# Patient Record
Sex: Male | Born: 1945
Health system: Southern US, Community
[De-identification: ages and names within clinical notes are randomized; demographics above are authoritative.]

## PROBLEM LIST (undated history)

## (undated) DIAGNOSIS — N200 Calculus of kidney: Secondary | ICD-10-CM

## (undated) DIAGNOSIS — I213 ST elevation (STEMI) myocardial infarction of unspecified site: Secondary | ICD-10-CM

## (undated) DIAGNOSIS — R1310 Dysphagia, unspecified: Secondary | ICD-10-CM

## (undated) DIAGNOSIS — G7000 Myasthenia gravis without (acute) exacerbation: Secondary | ICD-10-CM

## (undated) DIAGNOSIS — E785 Hyperlipidemia, unspecified: Secondary | ICD-10-CM

## (undated) DIAGNOSIS — G459 Transient cerebral ischemic attack, unspecified: Secondary | ICD-10-CM

## (undated) HISTORY — PX: LITHOTRIPSY: SUR834

## (undated) HISTORY — PX: LASIK: SHX215

## (undated) HISTORY — DX: Calculus of kidney: N20.0

## (undated) HISTORY — PX: BACK SURGERY: SHX140

## (undated) HISTORY — DX: Transient cerebral ischemic attack, unspecified: G45.9

## (undated) HISTORY — PX: NECK SURGERY: SHX720

## (undated) HISTORY — DX: Myasthenia gravis without (acute) exacerbation: G70.00

## (undated) HISTORY — PX: TONSILLECTOMY: SUR1361

## (undated) HISTORY — PX: CARDIAC CATHETERIZATION: SHX172

## (undated) HISTORY — DX: Dysphagia, unspecified: R13.10

---

## 2016-01-21 DIAGNOSIS — I129 Hypertensive chronic kidney disease with stage 1 through stage 4 chronic kidney disease, or unspecified chronic kidney disease: Secondary | ICD-10-CM | POA: Diagnosis not present

## 2016-01-21 DIAGNOSIS — N529 Male erectile dysfunction, unspecified: Secondary | ICD-10-CM | POA: Diagnosis not present

## 2016-01-21 DIAGNOSIS — Z87898 Personal history of other specified conditions: Secondary | ICD-10-CM | POA: Diagnosis not present

## 2016-01-21 DIAGNOSIS — R252 Cramp and spasm: Secondary | ICD-10-CM | POA: Diagnosis not present

## 2016-01-21 DIAGNOSIS — Z23 Encounter for immunization: Secondary | ICD-10-CM | POA: Diagnosis not present

## 2016-01-21 DIAGNOSIS — E785 Hyperlipidemia, unspecified: Secondary | ICD-10-CM | POA: Diagnosis not present

## 2016-01-21 DIAGNOSIS — N182 Chronic kidney disease, stage 2 (mild): Secondary | ICD-10-CM | POA: Diagnosis not present

## 2016-01-29 DIAGNOSIS — Z125 Encounter for screening for malignant neoplasm of prostate: Secondary | ICD-10-CM | POA: Diagnosis not present

## 2016-01-29 DIAGNOSIS — Z79899 Other long term (current) drug therapy: Secondary | ICD-10-CM | POA: Diagnosis not present

## 2016-01-29 DIAGNOSIS — I129 Hypertensive chronic kidney disease with stage 1 through stage 4 chronic kidney disease, or unspecified chronic kidney disease: Secondary | ICD-10-CM | POA: Diagnosis not present

## 2016-01-29 DIAGNOSIS — E785 Hyperlipidemia, unspecified: Secondary | ICD-10-CM | POA: Diagnosis not present

## 2016-01-29 DIAGNOSIS — Z6833 Body mass index (BMI) 33.0-33.9, adult: Secondary | ICD-10-CM | POA: Diagnosis not present

## 2016-01-29 DIAGNOSIS — Z0000 Encounter for general adult medical examination without abnormal findings: Secondary | ICD-10-CM | POA: Diagnosis not present

## 2016-01-29 DIAGNOSIS — N182 Chronic kidney disease, stage 2 (mild): Secondary | ICD-10-CM | POA: Diagnosis not present

## 2016-01-29 DIAGNOSIS — E669 Obesity, unspecified: Secondary | ICD-10-CM | POA: Diagnosis not present

## 2016-01-29 DIAGNOSIS — Z136 Encounter for screening for cardiovascular disorders: Secondary | ICD-10-CM | POA: Diagnosis not present

## 2016-02-23 HISTORY — PX: CYSTOSCOPY WITH INSERTION OF UROLIFT: SHX6678

## 2016-04-23 DIAGNOSIS — N3000 Acute cystitis without hematuria: Secondary | ICD-10-CM | POA: Diagnosis not present

## 2016-04-23 DIAGNOSIS — N401 Enlarged prostate with lower urinary tract symptoms: Secondary | ICD-10-CM | POA: Diagnosis not present

## 2016-04-23 DIAGNOSIS — R351 Nocturia: Secondary | ICD-10-CM | POA: Diagnosis not present

## 2016-04-23 DIAGNOSIS — R100 Acute abdomen: Secondary | ICD-10-CM | POA: Diagnosis not present

## 2016-04-23 DIAGNOSIS — N318 Other neuromuscular dysfunction of bladder: Secondary | ICD-10-CM | POA: Diagnosis not present

## 2016-05-20 DIAGNOSIS — E785 Hyperlipidemia, unspecified: Secondary | ICD-10-CM | POA: Diagnosis not present

## 2016-05-20 DIAGNOSIS — N529 Male erectile dysfunction, unspecified: Secondary | ICD-10-CM | POA: Diagnosis not present

## 2016-05-20 DIAGNOSIS — Z79899 Other long term (current) drug therapy: Secondary | ICD-10-CM | POA: Diagnosis not present

## 2016-05-20 DIAGNOSIS — R0602 Shortness of breath: Secondary | ICD-10-CM | POA: Diagnosis not present

## 2016-05-20 DIAGNOSIS — N182 Chronic kidney disease, stage 2 (mild): Secondary | ICD-10-CM | POA: Diagnosis not present

## 2016-05-20 DIAGNOSIS — I129 Hypertensive chronic kidney disease with stage 1 through stage 4 chronic kidney disease, or unspecified chronic kidney disease: Secondary | ICD-10-CM | POA: Diagnosis not present

## 2016-05-25 DIAGNOSIS — N401 Enlarged prostate with lower urinary tract symptoms: Secondary | ICD-10-CM | POA: Diagnosis not present

## 2016-05-25 DIAGNOSIS — R351 Nocturia: Secondary | ICD-10-CM | POA: Diagnosis not present

## 2016-05-25 DIAGNOSIS — N3020 Other chronic cystitis without hematuria: Secondary | ICD-10-CM | POA: Diagnosis not present

## 2016-07-13 DIAGNOSIS — R351 Nocturia: Secondary | ICD-10-CM | POA: Diagnosis not present

## 2016-07-13 DIAGNOSIS — N318 Other neuromuscular dysfunction of bladder: Secondary | ICD-10-CM | POA: Diagnosis not present

## 2016-07-13 DIAGNOSIS — N401 Enlarged prostate with lower urinary tract symptoms: Secondary | ICD-10-CM | POA: Diagnosis not present

## 2016-07-13 DIAGNOSIS — N3020 Other chronic cystitis without hematuria: Secondary | ICD-10-CM | POA: Diagnosis not present

## 2016-07-20 DIAGNOSIS — N3020 Other chronic cystitis without hematuria: Secondary | ICD-10-CM | POA: Diagnosis not present

## 2016-07-27 DIAGNOSIS — N401 Enlarged prostate with lower urinary tract symptoms: Secondary | ICD-10-CM | POA: Diagnosis not present

## 2016-07-27 DIAGNOSIS — N3020 Other chronic cystitis without hematuria: Secondary | ICD-10-CM | POA: Diagnosis not present

## 2016-07-27 DIAGNOSIS — N318 Other neuromuscular dysfunction of bladder: Secondary | ICD-10-CM | POA: Diagnosis not present

## 2016-07-27 DIAGNOSIS — R351 Nocturia: Secondary | ICD-10-CM | POA: Diagnosis not present

## 2016-07-29 DIAGNOSIS — E785 Hyperlipidemia, unspecified: Secondary | ICD-10-CM | POA: Diagnosis not present

## 2016-07-29 DIAGNOSIS — Z87442 Personal history of urinary calculi: Secondary | ICD-10-CM | POA: Diagnosis not present

## 2016-07-29 DIAGNOSIS — I10 Essential (primary) hypertension: Secondary | ICD-10-CM | POA: Diagnosis not present

## 2016-07-29 DIAGNOSIS — K219 Gastro-esophageal reflux disease without esophagitis: Secondary | ICD-10-CM | POA: Diagnosis not present

## 2016-07-29 DIAGNOSIS — R351 Nocturia: Secondary | ICD-10-CM | POA: Diagnosis not present

## 2016-07-29 DIAGNOSIS — N318 Other neuromuscular dysfunction of bladder: Secondary | ICD-10-CM | POA: Diagnosis not present

## 2016-07-29 DIAGNOSIS — N401 Enlarged prostate with lower urinary tract symptoms: Secondary | ICD-10-CM | POA: Diagnosis not present

## 2016-07-29 DIAGNOSIS — N3020 Other chronic cystitis without hematuria: Secondary | ICD-10-CM | POA: Diagnosis not present

## 2016-07-29 DIAGNOSIS — Z79899 Other long term (current) drug therapy: Secondary | ICD-10-CM | POA: Diagnosis not present

## 2016-08-05 DIAGNOSIS — R351 Nocturia: Secondary | ICD-10-CM | POA: Diagnosis not present

## 2016-08-05 DIAGNOSIS — N318 Other neuromuscular dysfunction of bladder: Secondary | ICD-10-CM | POA: Diagnosis not present

## 2016-08-05 DIAGNOSIS — N3020 Other chronic cystitis without hematuria: Secondary | ICD-10-CM | POA: Diagnosis not present

## 2016-08-05 DIAGNOSIS — N401 Enlarged prostate with lower urinary tract symptoms: Secondary | ICD-10-CM | POA: Diagnosis not present

## 2016-08-16 DIAGNOSIS — I129 Hypertensive chronic kidney disease with stage 1 through stage 4 chronic kidney disease, or unspecified chronic kidney disease: Secondary | ICD-10-CM | POA: Diagnosis not present

## 2016-08-16 DIAGNOSIS — K1370 Unspecified lesions of oral mucosa: Secondary | ICD-10-CM | POA: Diagnosis not present

## 2016-08-16 DIAGNOSIS — N182 Chronic kidney disease, stage 2 (mild): Secondary | ICD-10-CM | POA: Diagnosis not present

## 2016-08-16 DIAGNOSIS — E785 Hyperlipidemia, unspecified: Secondary | ICD-10-CM | POA: Diagnosis not present

## 2016-08-20 DIAGNOSIS — R351 Nocturia: Secondary | ICD-10-CM | POA: Diagnosis not present

## 2016-08-20 DIAGNOSIS — N318 Other neuromuscular dysfunction of bladder: Secondary | ICD-10-CM | POA: Diagnosis not present

## 2016-08-20 DIAGNOSIS — N3020 Other chronic cystitis without hematuria: Secondary | ICD-10-CM | POA: Diagnosis not present

## 2016-08-20 DIAGNOSIS — N401 Enlarged prostate with lower urinary tract symptoms: Secondary | ICD-10-CM | POA: Diagnosis not present

## 2016-09-20 DIAGNOSIS — R351 Nocturia: Secondary | ICD-10-CM | POA: Diagnosis not present

## 2016-09-20 DIAGNOSIS — N318 Other neuromuscular dysfunction of bladder: Secondary | ICD-10-CM | POA: Diagnosis not present

## 2016-09-20 DIAGNOSIS — N3020 Other chronic cystitis without hematuria: Secondary | ICD-10-CM | POA: Diagnosis not present

## 2016-09-20 DIAGNOSIS — N401 Enlarged prostate with lower urinary tract symptoms: Secondary | ICD-10-CM | POA: Diagnosis not present

## 2016-11-18 DIAGNOSIS — I129 Hypertensive chronic kidney disease with stage 1 through stage 4 chronic kidney disease, or unspecified chronic kidney disease: Secondary | ICD-10-CM | POA: Diagnosis not present

## 2016-11-18 DIAGNOSIS — E785 Hyperlipidemia, unspecified: Secondary | ICD-10-CM | POA: Diagnosis not present

## 2016-11-18 DIAGNOSIS — N182 Chronic kidney disease, stage 2 (mild): Secondary | ICD-10-CM | POA: Diagnosis not present

## 2016-11-25 DIAGNOSIS — D649 Anemia, unspecified: Secondary | ICD-10-CM | POA: Diagnosis not present

## 2016-11-25 DIAGNOSIS — Z23 Encounter for immunization: Secondary | ICD-10-CM | POA: Diagnosis not present

## 2016-12-08 DIAGNOSIS — R4781 Slurred speech: Secondary | ICD-10-CM | POA: Diagnosis not present

## 2016-12-13 DIAGNOSIS — H547 Unspecified visual loss: Secondary | ICD-10-CM | POA: Diagnosis not present

## 2016-12-13 DIAGNOSIS — H538 Other visual disturbances: Secondary | ICD-10-CM | POA: Diagnosis not present

## 2016-12-13 DIAGNOSIS — D649 Anemia, unspecified: Secondary | ICD-10-CM | POA: Diagnosis not present

## 2016-12-13 DIAGNOSIS — G459 Transient cerebral ischemic attack, unspecified: Secondary | ICD-10-CM | POA: Diagnosis not present

## 2016-12-13 DIAGNOSIS — R1310 Dysphagia, unspecified: Secondary | ICD-10-CM | POA: Diagnosis not present

## 2016-12-13 DIAGNOSIS — R4702 Dysphasia: Secondary | ICD-10-CM | POA: Diagnosis not present

## 2016-12-16 DIAGNOSIS — G459 Transient cerebral ischemic attack, unspecified: Secondary | ICD-10-CM | POA: Diagnosis not present

## 2016-12-21 DIAGNOSIS — R1310 Dysphagia, unspecified: Secondary | ICD-10-CM | POA: Diagnosis not present

## 2016-12-21 DIAGNOSIS — R351 Nocturia: Secondary | ICD-10-CM | POA: Diagnosis not present

## 2016-12-21 DIAGNOSIS — N401 Enlarged prostate with lower urinary tract symptoms: Secondary | ICD-10-CM | POA: Diagnosis not present

## 2016-12-21 DIAGNOSIS — N318 Other neuromuscular dysfunction of bladder: Secondary | ICD-10-CM | POA: Diagnosis not present

## 2016-12-21 DIAGNOSIS — N3020 Other chronic cystitis without hematuria: Secondary | ICD-10-CM | POA: Diagnosis not present

## 2017-01-12 ENCOUNTER — Encounter: Payer: Self-pay | Admitting: Neurology

## 2017-01-25 DIAGNOSIS — R1310 Dysphagia, unspecified: Secondary | ICD-10-CM | POA: Diagnosis not present

## 2017-01-25 DIAGNOSIS — D500 Iron deficiency anemia secondary to blood loss (chronic): Secondary | ICD-10-CM | POA: Diagnosis not present

## 2017-01-25 DIAGNOSIS — R195 Other fecal abnormalities: Secondary | ICD-10-CM | POA: Diagnosis not present

## 2017-02-03 DIAGNOSIS — K649 Unspecified hemorrhoids: Secondary | ICD-10-CM | POA: Diagnosis not present

## 2017-02-03 DIAGNOSIS — K5730 Diverticulosis of large intestine without perforation or abscess without bleeding: Secondary | ICD-10-CM | POA: Diagnosis not present

## 2017-02-03 DIAGNOSIS — R1314 Dysphagia, pharyngoesophageal phase: Secondary | ICD-10-CM | POA: Diagnosis not present

## 2017-02-03 DIAGNOSIS — K219 Gastro-esophageal reflux disease without esophagitis: Secondary | ICD-10-CM | POA: Diagnosis not present

## 2017-02-03 DIAGNOSIS — R195 Other fecal abnormalities: Secondary | ICD-10-CM | POA: Diagnosis not present

## 2017-02-03 DIAGNOSIS — K648 Other hemorrhoids: Secondary | ICD-10-CM | POA: Diagnosis not present

## 2017-02-03 DIAGNOSIS — K2970 Gastritis, unspecified, without bleeding: Secondary | ICD-10-CM | POA: Diagnosis not present

## 2017-02-03 DIAGNOSIS — I129 Hypertensive chronic kidney disease with stage 1 through stage 4 chronic kidney disease, or unspecified chronic kidney disease: Secondary | ICD-10-CM | POA: Diagnosis not present

## 2017-02-03 DIAGNOSIS — D649 Anemia, unspecified: Secondary | ICD-10-CM | POA: Diagnosis not present

## 2017-02-03 DIAGNOSIS — K2900 Acute gastritis without bleeding: Secondary | ICD-10-CM | POA: Diagnosis not present

## 2017-02-03 DIAGNOSIS — D500 Iron deficiency anemia secondary to blood loss (chronic): Secondary | ICD-10-CM | POA: Diagnosis not present

## 2017-02-03 DIAGNOSIS — N182 Chronic kidney disease, stage 2 (mild): Secondary | ICD-10-CM | POA: Diagnosis not present

## 2017-02-03 DIAGNOSIS — K449 Diaphragmatic hernia without obstruction or gangrene: Secondary | ICD-10-CM | POA: Diagnosis not present

## 2017-03-21 DIAGNOSIS — Z1211 Encounter for screening for malignant neoplasm of colon: Secondary | ICD-10-CM | POA: Diagnosis not present

## 2017-03-21 DIAGNOSIS — N401 Enlarged prostate with lower urinary tract symptoms: Secondary | ICD-10-CM | POA: Diagnosis not present

## 2017-03-21 DIAGNOSIS — Z125 Encounter for screening for malignant neoplasm of prostate: Secondary | ICD-10-CM | POA: Diagnosis not present

## 2017-03-21 DIAGNOSIS — Z9181 History of falling: Secondary | ICD-10-CM | POA: Diagnosis not present

## 2017-03-21 DIAGNOSIS — N3020 Other chronic cystitis without hematuria: Secondary | ICD-10-CM | POA: Diagnosis not present

## 2017-03-21 DIAGNOSIS — I129 Hypertensive chronic kidney disease with stage 1 through stage 4 chronic kidney disease, or unspecified chronic kidney disease: Secondary | ICD-10-CM | POA: Diagnosis not present

## 2017-03-21 DIAGNOSIS — E669 Obesity, unspecified: Secondary | ICD-10-CM | POA: Diagnosis not present

## 2017-03-21 DIAGNOSIS — Z0000 Encounter for general adult medical examination without abnormal findings: Secondary | ICD-10-CM | POA: Diagnosis not present

## 2017-03-21 DIAGNOSIS — Z1331 Encounter for screening for depression: Secondary | ICD-10-CM | POA: Diagnosis not present

## 2017-03-21 DIAGNOSIS — D649 Anemia, unspecified: Secondary | ICD-10-CM | POA: Diagnosis not present

## 2017-03-21 DIAGNOSIS — R739 Hyperglycemia, unspecified: Secondary | ICD-10-CM | POA: Diagnosis not present

## 2017-03-21 DIAGNOSIS — N182 Chronic kidney disease, stage 2 (mild): Secondary | ICD-10-CM | POA: Diagnosis not present

## 2017-03-21 DIAGNOSIS — R351 Nocturia: Secondary | ICD-10-CM | POA: Diagnosis not present

## 2017-03-21 DIAGNOSIS — N318 Other neuromuscular dysfunction of bladder: Secondary | ICD-10-CM | POA: Diagnosis not present

## 2017-04-20 ENCOUNTER — Encounter: Payer: Self-pay | Admitting: Neurology

## 2017-04-20 ENCOUNTER — Ambulatory Visit: Payer: Medicare HMO | Admitting: Neurology

## 2017-04-20 ENCOUNTER — Other Ambulatory Visit: Payer: Medicare HMO

## 2017-04-20 VITALS — BP 140/70 | HR 66 | Ht 66.0 in | Wt 187.5 lb

## 2017-04-20 DIAGNOSIS — R1310 Dysphagia, unspecified: Secondary | ICD-10-CM

## 2017-04-20 DIAGNOSIS — R471 Dysarthria and anarthria: Secondary | ICD-10-CM | POA: Diagnosis not present

## 2017-04-20 MED ORDER — PYRIDOSTIGMINE BROMIDE 60 MG PO TABS: ORAL_TABLET | ORAL | 5 refills | Status: DC

## 2017-04-20 NOTE — Progress Notes (Signed)
[facility] Neurology Division Clinic Note - Initial Visit   Date: 04/20/17  Adrian Bell MRN: [id number] DOB: [birth year]   Dear Dr. Maisie Fus:  Thank you for your kind referral of Adrian Bell for consultation of dysphagia and dysarthria. Although his history is well known to you, please allow [name] to reiterate it for the purpose of our medical record. The patient was accompanied to the clinic by wife who also provides collateral information.     History of Present Illness: Adrian Bell is a 72 y.o. right-handed Caucasian male with hypertension, kidney stones, and iron deficiency anemia presenting for evaluation of dysarthria and dysphagia.    Starting around the summer of 2018, he developed spells of slurred speech, difficulty swallowing, and double vision which is always worse by the afternoon and evening. Closing one eye would resolve his double vision. Slurred speech is worse after talking for longer periods of time. Rest always improves his symptoms.  He denies any arm or leg weakness.  He went to the emergency department in October and was evaluated for TIA with CT head, MRI brain, and echo which returned normal, per patient.    He has esophagus was stretched recently, which has helped.  He takes gabapentin 300mg  twice daily and feels that his speech and swallowing has improved, but not resolved.    He had one episode of what sounds like transient global amnesia in 2016 which lasted about an hour.   Out-side paper records, electronic medical record, and images have been reviewed where available and summarized as:  CT head 12/08/2016:  Unremarkable  Past Medical History:  Diagnosis Date  . Dysphagia   . Kidney stones   . TIA (transient ischemic attack)     Past Surgical History:  Procedure Laterality Date  . BACK SURGERY    . LITHOTRIPSY       Medications:  Outpatient Encounter Medications as of 04/20/2017  Medication Sig  . aspirin EC 81 MG tablet Take 81 mg by mouth  daily.  . Ferrous Sulfate (SLOW FE PO) Take by mouth.  . gabapentin (NEURONTIN) 300 MG capsule   . ramipril (ALTACE) 5 MG capsule    No facility-administered encounter medications on file as of 04/20/2017.      Allergies:  Allergies  Allergen Reactions  . Demerol [Meperidine Hcl]     Family History: Family History  Problem Relation Age of Onset  . Heart attack Mother   . Heart attack Father   . Diabetes Sister   . Heart attack Sister   . Esophageal cancer Brother     Social History: Social History   Tobacco Use  . Smoking status: Never Smoker  . Smokeless tobacco: Former Engineer, water Use Topics  . Alcohol use: No    Frequency: Never  . Drug use: No   Social History   Social History Narrative   Lives with wife in a one story home.  Has 3 children.     Retired Public librarian.     Education: some college.     Review of Systems:  CONSTITUTIONAL: No fevers, chills, night sweats, or weight loss.   EYES: No visual changes or eye pain ENT: No hearing changes.  No history of nose bleeds.   RESPIRATORY: No cough, wheezing and shortness of breath.   CARDIOVASCULAR: Negative for chest pain, and palpitations.   GI: Negative for abdominal discomfort, blood in stools or black stools.  No recent change in bowel habits.   GU:  No history of incontinence.   MUSCLOSKELETAL: No history of joint pain or swelling.  No myalgias.   SKIN: Negative for lesions, rash, and itching.   HEMATOLOGY/ONCOLOGY: Negative for prolonged bleeding, bruising easily, and swollen nodes.  No history of cancer.   ENDOCRINE: Negative for cold or heat intolerance, polydipsia or goiter.   PSYCH:  No depression or anxiety symptoms.   NEURO: As Above.   Vital Signs:  BP 140/70   Pulse 66   Ht 5\' 6"  (1.676 m)   Wt 187 lb 8 oz (85 kg)   SpO2 98%   BMI 30.26 kg/m    General Medical Exam:   General:  Well appearing, comfortable.   Eyes/ENT: see cranial nerve examination.     Neck: No masses appreciated.  Full range of motion without tenderness.  No carotid bruits. Respiratory:  Clear to auscultation, good air entry bilaterally.   Cardiac:  Regular rate and rhythm, no murmur.   Extremities:  No deformities, edema, or skin discoloration.  Skin:  No rashes or lesions.  Neurological Exam: MENTAL STATUS including orientation to time, place, person, recent and remote memory, attention span and concentration, language, and fund of knowledge is normal.  With prolonged talking, there is mild dysarthria.    CRANIAL NERVES: II:  No visual field defects.  Unremarkable fundi.   III-IV-VI: Pupils equal round and reactive to light.  Normal conjugate, extra-ocular eye movements in all directions of gaze.  No nystagmus. Mild bilateral ptosis without worsening with sustained upgaze.   V:  Normal facial sensation.    VII:  Normal facial symmetry and movements. Frontalis, orbicularis oculi, orbicularis oris, and buccinator is 5/5.    VIII:  Normal hearing and vestibular function.   IX-X:  Normal palatal movement.   XI:  Normal shoulder shrug and head rotation.   XII:  Normal tongue strength and range of motion, no deviation or fasciculation.  MOTOR:  No atrophy, fasciculations or abnormal movements. No muscle fatigability.  No pronator drift.  Tone is normal.    Right Upper Extremity:    Left Upper Extremity:    Deltoid  5/5   Deltoid  5/5   Biceps  5/5   Biceps  5/5   Triceps  5/5   Triceps  5/5   Wrist extensors  5/5   Wrist extensors  5/5   Wrist flexors  5/5   Wrist flexors  5/5   Finger extensors  5/5   Finger extensors  5/5   Finger flexors  5/5   Finger flexors  5/5   Dorsal interossei  5/5   Dorsal interossei  5/5   Abductor pollicis  5/5   Abductor pollicis  5/5   Tone (Ashworth scale)  0  Tone (Ashworth scale)  0   Right Lower Extremity:    Left Lower Extremity:    Hip flexors  5/5   Hip flexors  5/5   Hip extensors  5/5   Hip extensors  5/5   Knee flexors   5/5   Knee flexors  5/5   Knee extensors  5/5   Knee extensors  5/5   Dorsiflexors  5/5   Dorsiflexors  5/5   Plantarflexors  5/5   Plantarflexors  5/5   Toe extensors  5/5   Toe extensors  5/5   Toe flexors  5/5   Toe flexors  5/5   Tone (Ashworth scale)  0  Tone (Ashworth scale)  0   MSRs:  Right  Left brachioradialis 2+  brachioradialis 2+  biceps 2+  biceps 2+  triceps 2+  triceps 2+  patellar 2+  patellar 2+  ankle jerk 2+  ankle jerk 2+  Hoffman no  Hoffman no  plantar response down  plantar response down   SENSORY:  Normal and symmetric perception of light touch, pinprick, vibration, and proprioception.    COORDINATION/GAIT: Normal finger-to- nose-finger.  Intact rapid alternating movements bilaterally.  Able to rise from a chair without using arms.  Gait narrow based and stable. Tandem and stressed gait intact.    IMPRESSION: Mr. Eudy is a 72 year-old man referred for evaluation of dysphagia, dysarthria, and diplopia starting in the fall of 2018.  Symptoms are very classic for myasthenia gravis, especially as he has diurnal variation.  I will check myasthenia gravis antibody panel and offer a trial of mestinon.  We discussed the potential diagnosis of myasthenia gravis regarding the pathogenesis, prognosis, management plan.  PLAN/RECOMMENDATIONS:  Check myasthenia gravis panel Start mestinon 60mg  three times daily (8am, 1pm, 5pm).  Side effects discussed.  If antibodies return negative, the next step is NCS/EMG with RNS of the right arm and leg.   Mestinon will be held on the day of testing.   Return to clinic after testing.  Thank you for allowing me to participate in patient's care.  If I can answer any additional questions, I would be pleased to do so.    Sincerely,    Donika K. Allena Katz, DO

## 2017-04-20 NOTE — Patient Instructions (Addendum)
Check labs  Start mestinon (pyridostigmine) 60mg  three times daily at 8am, 1pm, and 5pm  If your labs are normal, we will bring you back for a nerve conduction study.  Please do not take your mestinon on the day of your testing procedure.

## 2017-04-28 ENCOUNTER — Telehealth: Payer: Self-pay | Admitting: Neurology

## 2017-04-28 LAB — MYASTHENIA GRAVIS PANEL 2
A CHR BINDING ABS: 9.2 nmol/L — ABNORMAL HIGH
ACHR Blocking Abs: 76 % Inhibition — ABNORMAL HIGH (ref <15)
Acetylchol Modul Ab: 90 % Inhibition — ABNORMAL HIGH

## 2017-04-28 NOTE — Telephone Encounter (Signed)
Patient wants the results of the blood work that he had done please call

## 2017-04-28 NOTE — Telephone Encounter (Signed)
Patient made aware results not back. Myasthenia panel can take awhile but we will call when we receive them. He expressed understanding.

## 2017-05-03 ENCOUNTER — Ambulatory Visit: Payer: Medicare HMO | Admitting: Neurology

## 2017-05-03 ENCOUNTER — Encounter: Payer: Self-pay | Admitting: Neurology

## 2017-05-03 ENCOUNTER — Ambulatory Visit
Admission: RE | Admit: 2017-05-03 | Discharge: 2017-05-03 | Disposition: A | Discharge status: Home / Self Care | Payer: Medicare HMO | Source: Ambulatory Visit | Attending: Neurology | Admitting: Neurology

## 2017-05-03 VITALS — BP 136/70 | HR 55 | Ht 66.0 in | Wt 186.4 lb

## 2017-05-03 DIAGNOSIS — G7001 Myasthenia gravis with (acute) exacerbation: Secondary | ICD-10-CM

## 2017-05-03 DIAGNOSIS — I700 Atherosclerosis of aorta: Secondary | ICD-10-CM | POA: Diagnosis not present

## 2017-05-03 DIAGNOSIS — R0602 Shortness of breath: Secondary | ICD-10-CM

## 2017-05-03 DIAGNOSIS — G7000 Myasthenia gravis without (acute) exacerbation: Secondary | ICD-10-CM | POA: Insufficient documentation

## 2017-05-03 HISTORY — DX: Myasthenia gravis without (acute) exacerbation: G70.00

## 2017-05-03 MED ORDER — IOPAMIDOL (ISOVUE-300) INJECTION 61%
75.0000 mL | Freq: Once | INTRAVENOUS | Status: AC | PRN
  Administered 2017-05-03: 75 mL via INTRAVENOUS

## 2017-05-03 MED ORDER — PREDNISONE 10 MG PO TABS: 20.0000 mg | ORAL_TABLET | Freq: Every day | ORAL | 3 refills | Status: DC

## 2017-05-03 NOTE — Patient Instructions (Addendum)
Start prednisone 20mg  daily Start calcium intake of 1200 mg/day and vitamin D intake of 800 international units/day Start prilosec 20mg  daily CT chest  Return to clinic on April 17th at 11:30am

## 2017-05-03 NOTE — Progress Notes (Signed)
Follow-up Visit   Date: 05/03/17    Adrian Bell MRN: [id number] DOB: 1946/01/05   Interim History: Adrian Bell is a 72 y.o. right-handed Caucasian male with hypertension, kidney stones, and iron deficiency anemia returning to the clinic for follow-up of seropositive myasthenia gravis.  The patient was accompanied to the clinic by wife who also provides collateral information.    History of present illness: Starting around the summer of 2018, he developed spells of slurred speech, difficulty swallowing, and double vision which is always worse by the afternoon and evening. Closing one eye would resolve his double vision. Slurred speech is worse after talking for longer periods of time. Rest always improves his symptoms.  He denies any arm or leg weakness.  He went to the emergency department in October and was evaluated for TIA with CT head, MRI brain, and echo which returned normal, per patient.    He has esophagus was stretched recently, which has helped.  He takes gabapentin 300mg  twice daily and feels that his speech and swallowing has improved, but not resolved.    He had one episode of what sounds like transient global amnesia in 2016 which lasted about an hour.   UPDATE 05/03/2017:  He is here to discuss the results of his testing which shows positive AChR antibodies, confirming diagnosis of myasthenia gravis, which was suspected.  He was started on mestinon and within an hour of his first dose, he noticed resolution of double vision.  He was able to increase mestinon to 60mg  three times daily (8am, 1pm, and 5pm) and has been tolerating it well; initially he has some diarrhea, which improved.  Swallowing has also improved some, but he has not appreciated any difference in his speech.  He denies any weakness.     Medications:  Current Outpatient Medications on File Prior to Visit  Medication Sig Dispense Refill  . aspirin EC 81 MG tablet Take 81 mg by mouth daily.    . Ferrous  Sulfate (SLOW FE PO) Take by mouth.    . pyridostigmine (MESTINON) 60 MG tablet Take 1 tablet at 8am, 1 tab at 1pm, and 1 tab at 5pm. 90 tablet 5  . ramipril (ALTACE) 5 MG capsule     . gabapentin (NEURONTIN) 300 MG capsule   0   No current facility-administered medications on file prior to visit.     Allergies:  Allergies  Allergen Reactions  . Demerol [Meperidine Hcl]     Review of Systems:  CONSTITUTIONAL: No fevers, chills, night sweats, or weight loss.  EYES: No visual changes or eye pain ENT: No hearing changes.  No history of nose bleeds.   RESPIRATORY: No cough, wheezing and shortness of breath.   CARDIOVASCULAR: Negative for chest pain, and palpitations.   GI: Negative for abdominal discomfort, blood in stools or black stools.  No recent change in bowel habits.   GU:  No history of incontinence.   MUSCLOSKELETAL: No history of joint pain or swelling.  No myalgias.   SKIN: Negative for lesions, rash, and itching.   ENDOCRINE: Negative for cold or heat intolerance, polydipsia or goiter.   PSYCH:  No depression or anxiety symptoms.   NEURO: As Above.   Vital Signs:  BP 136/70   Pulse (!) 55   Ht 5\' 6"  (1.676 m)   Wt 186 lb 6 oz (84.5 kg)   SpO2 98%   BMI 30.08 kg/m    General: Well-appearing, comfortable.  Neck: No carotid bruit CV:  Regular rate and rhythm Ext: no edema  Neurological Exam: MENTAL STATUS including orientation to time, place, person, recent and remote memory, attention span and concentration, language, and fund of knowledge is normal.  Speech is mildly dysarthric.  CRANIAL NERVES:  Pupils equal round and reactive to light.  Normal conjugate, extra-ocular eye movements in all directions of gaze.  Right ptosis (mild) slight worsening with sustained upgaze.  Face is symmetric. Palate elevates symmetrically.  Tongue is midline.  Orbicularis oculi 5-/5.  Mild weakness with buccinator 4/5.    MOTOR:  Motor strength is 5/5 in all extremities.  Neck  flexion is 5/5.  No atrophy, fasciculations or abnormal movements.  No pronator drift.  Tone is normal.    COORDINATION/GAIT:   Gait narrow based and stable.   Data: CT head 12/08/2016:  Unremarkable  Labs 04/20/2017:   AChR binding 9.20*, blocking 76*, and modulating 90*   IMPRESSION/PLAN: Seropositive myasthenia gravis with exacerbation, isolated bulbar involvement (onset 2018, diagnosed March 2019).  I had an extensive discussion with the patient regarding the diagnosis, pathogenesis, prognosis, management plan, meds to avoid, and potential crisis myasthenia gravis. I also discussed the warning signs and symptoms of MG crisis (including significant swallowing and breathing difficulty) that should prompt urgent medical evaluation since an exacerbation of myasthenia gravis is potentially life-threatening.  - CT chest w contrast to evaluate for thymoma  - Start prednisone 20mg  daily  - Start calcium intake of 1200 mg/day and vitamin D intake of 800 international units/day  - Start prilosec 20mg  daily  - Check CMP and TPMT at the next visit, if there is no improvement with prednisone   Return to clinic in 1 month   The duration of this appointment visit was 40 minutes of face-to-face time with the patient.  Greater than 50% of this time was spent in counseling, explanation of diagnosis, planning of further management, and coordination of care.   Thank you for allowing me to participate in patient's care.  If I can answer any additional questions, I would be pleased to do so.    Sincerely,    Swara Donze K. Allena Katz, DO

## 2017-05-09 ENCOUNTER — Telehealth: Payer: Self-pay | Admitting: *Deleted

## 2017-05-09 NOTE — Telephone Encounter (Signed)
Report mailed.  

## 2017-05-09 NOTE — Telephone Encounter (Signed)
-----   Message from Glendale Chard, DO sent at 05/06/2017  4:34 PM EDT ----- Results discussed with patient, no thymoma.  There is coronary artery calcification which I have asked that he f/u with cardiology for further evaluation.  Morrie Sheldon, please mail patient a report of his CT chest to [facility]. Thanks.

## 2017-05-13 ENCOUNTER — Telehealth: Payer: Self-pay | Admitting: Neurology

## 2017-05-13 MED ORDER — PREDNISONE 10 MG PO TABS: 30.0000 mg | ORAL_TABLET | Freq: Every day | ORAL | 3 refills | Status: DC

## 2017-05-13 MED ORDER — PYRIDOSTIGMINE BROMIDE 60 MG PO TABS: ORAL_TABLET | ORAL | 5 refills | Status: DC

## 2017-05-13 NOTE — Telephone Encounter (Signed)
Called to see how patient was doing on prednisone 20mg  and he continues to have dysarthria, so will increase to prednisone 30mg  and mestinon 60mg  QID (7am, 11am, 3pm, and 7pm). New prescriptions were sent.  If there is any worsening MG symptoms, low threshold to start IVIG.  Donika K. Allena Katz, DO

## 2017-06-08 ENCOUNTER — Encounter: Payer: Self-pay | Admitting: Neurology

## 2017-06-08 ENCOUNTER — Ambulatory Visit: Payer: Medicare HMO | Admitting: Neurology

## 2017-06-08 VITALS — BP 118/60 | HR 61 | Ht 66.0 in | Wt 177.5 lb

## 2017-06-08 DIAGNOSIS — G7001 Myasthenia gravis with (acute) exacerbation: Secondary | ICD-10-CM

## 2017-06-08 NOTE — Patient Instructions (Addendum)
Continue prednisone to 30mg  daily for the month of April  Starting May 1st, reduce to prednisone 25mg  daily for two weeks, then reduce to 20mg  daily  Continue mestinon as you are taking  I will request your most recent labs from the [place]  Check TPMT   Return to clinic on May 29th at 11:30am

## 2017-06-08 NOTE — Progress Notes (Signed)
Follow-up Visit   Date: 06/08/17    Adrian Bell MRN: [id number] DOB: 1945/04/05   Interim History: Adrian Bell is a 72 y.o. right-handed Caucasian male with hypertension, kidney stones, and iron deficiency anemia returning to the clinic for follow-up of seropositive myasthenia gravis.  The patient was accompanied to the clinic by wife who also provides collateral information.    History of present illness: Starting around the summer of 2018, he developed spells of slurred speech, difficulty swallowing, and double vision which is always worse by the afternoon and evening. Closing one eye would resolve his double vision. Slurred speech is worse after talking for longer periods of time. Rest always improves his symptoms.  He denies any arm or leg weakness.  He went to the emergency department in October and was evaluated for TIA with CT head, MRI brain, and echo which returned normal, per patient.    He has esophagus was stretched recently, which has helped.  He takes gabapentin 300mg  twice daily and feels that his speech and swallowing has improved, but not resolved.    He had one episode of what sounds like transient global amnesia in 2016 which lasted about an hour.   UPDATE 05/03/2017:  He is here to discuss the results of his testing which shows positive AChR antibodies, confirming diagnosis of myasthenia gravis, which was suspected.  He was started on mestinon and within an hour of his first dose, he noticed resolution of double vision.  He was able to increase mestinon to 60mg  three times daily (8am, 1pm, and 5pm) and has been tolerating it well; initially he has some diarrhea, which improved.  Swallowing has also improved some, but he has not appreciated any difference in his speech.  He denies any weakness.    UPDATE 06/08/2017:  He is here for follow-up visit.  Since increasing prednisone to 30mg  daily, he has noticed significant improvement in his speech and swallow. He is taking  mestinon 7:30p, 12:30, and 5:30p and feels that about 7hr after his dose, he sometimes get blurred vision, otherwise he has no weakness.  He is very happy with his progress and improvement.   Medications:  Current Outpatient Medications on File Prior to Visit  Medication Sig Dispense Refill  . aspirin EC 81 MG tablet Take 81 mg by mouth daily.    . Calcium Carbonate (CALCIUM 600 PO) Take by mouth.    . Ferrous Sulfate (SLOW FE PO) Take by mouth.    Marland Kitchen omeprazole (PRILOSEC) 20 MG capsule Take 20 mg by mouth daily.    . predniSONE (DELTASONE) 10 MG tablet Take 3 tablets (30 mg total) by mouth daily with breakfast. 100 tablet 3  . pyridostigmine (MESTINON) 60 MG tablet Take 1 tablet at 7am, 11am, 3pm, and 7pm. 120 tablet 5  . ramipril (ALTACE) 5 MG capsule     . gabapentin (NEURONTIN) 300 MG capsule   0   No current facility-administered medications on file prior to visit.     Allergies:  Allergies  Allergen Reactions  . Demerol [Meperidine Hcl]     Review of Systems:  CONSTITUTIONAL: No fevers, chills, night sweats, or weight loss.  EYES: No visual changes or eye pain ENT: No hearing changes.  No history of nose bleeds.   RESPIRATORY: No cough, wheezing and shortness of breath.   CARDIOVASCULAR: Negative for chest pain, and palpitations.   GI: Negative for abdominal discomfort, blood in stools or black stools.  No recent change in bowel  habits.   GU:  No history of incontinence.   MUSCLOSKELETAL: No history of joint pain or swelling.  No myalgias.   SKIN: Negative for lesions, rash, and itching.   ENDOCRINE: Negative for cold or heat intolerance, polydipsia or goiter.   PSYCH:  No depression or anxiety symptoms.   NEURO: As Above.   Vital Signs:  BP 118/60   Pulse 61   Ht 5\' 6"  (1.676 m)   Wt 177 lb 8 oz (80.5 kg)   SpO2 97%   BMI 28.65 kg/m     General:  Well appearing, comfortable   Neurological Exam: MENTAL STATUS including orientation to time, place, person, recent  and remote memory, attention span and concentration, language, and fund of knowledge is normal.  Speech is mildly dysarthric.  CRANIAL NERVES:  Pupils equal round and reactive to light.  Normal conjugate, extra-ocular eye movements in all directions of gaze.  Left ptosis (mild) with no worsening with sustained upgaze.  Face is symmetric. Palate elevates symmetrically.  Tongue is midline.  Orbicularis oculi, buccinator and orbicular is oris is 5/5.  MOTOR:  Motor strength is 5/5 in all extremities.  Neck flexion is 5/5.  COORDINATION/GAIT:   Gait narrow based and stable.   Data: CT head 12/08/2016:  Unremarkable  Labs 04/20/2017:   AChR binding 9.20*, blocking 76*, and modulating 90*  CT chest wo contrast 05/04/2017:   1. Unremarkable CT examination the chest. No anterior mediastinal mass/thymoma is identified. 2. No acute pulmonary findings or worrisome pulmonary lesions. 3. Significant three-vessel coronary artery calcifications. 4. Cholelithiasis.   IMPRESSION/PLAN: Seropositive myasthenia gravis with exacerbation, isolated bulbar involvement (onset 2018, diagnosed March 2019). Thymoma negative.  His bulbar weakness has markedly improved after increasing prednisone to 30mg  daily.  Exam shows subtle left ptosis and otherwise normal.  - Recommend that he stay on prednisone 30mg  for another 2 weeks, then reduce to 25mg  x 2 weeks, then 20mg  daily  - Stay on prednisone 20mg  until he sees me again  - Check TPMT to be sure he metabolizes azathioprine.    - I will request baseline CBC and CMP from the [place]  - If this is normal, plan to start azathioprine 50mg  daily x 2 weeks, then increase to 100mg  daily.  He will need CBC with diff and CMP checked weekly for the first month, then monthly x 3.  Risks and benefits discussed  Return to clinic in 6 weeks  Greater than 50% of this 30 minute visit was spent in counseling, explanation of diagnosis, planning of further management, and coordination of  care.    Thank you for allowing me to participate in patient's care.  If I can answer any additional questions, I would be pleased to do so.    Sincerely,    Shanyn Preisler K. Allena Katz, DO

## 2017-06-13 NOTE — Progress Notes (Signed)
Addendum  Labs rec'd from [facility] 05/18/2017:  WBC 6.5, Hgb 12.8, Hct 38.3, MCV 91.6, PLT 282, HbA1c 5.4, AST 16, ALT 23

## 2017-07-20 ENCOUNTER — Encounter: Payer: Self-pay | Admitting: Neurology

## 2017-07-20 ENCOUNTER — Ambulatory Visit: Payer: Medicare HMO | Admitting: Neurology

## 2017-07-20 VITALS — BP 130/70 | HR 60 | Ht 66.0 in | Wt 177.4 lb

## 2017-07-20 DIAGNOSIS — G7000 Myasthenia gravis without (acute) exacerbation: Secondary | ICD-10-CM | POA: Diagnosis not present

## 2017-07-20 NOTE — Progress Notes (Signed)
Follow-up Visit   Date: 07/20/17    Adrian Bell MRN: [id number] DOB: [birth year]   Interim History: Adrian Bell is a 72 y.o. right-handed Caucasian male with hypertension, kidney stones, and iron deficiency anemia returning to the clinic for follow-up of seropositive myasthenia gravis.  The patient was accompanied to the clinic by self.  History of present illness: Starting around the summer of 2018, he developed spells of slurred speech, difficulty swallowing, and double vision which is always worse by the afternoon and evening. Closing one eye would resolve his double vision. Slurred speech is worse after talking for longer periods of time. Rest always improves his symptoms.  He denies any arm or leg weakness.  He went to the emergency department in October and was evaluated for TIA with CT head, MRI brain, and echo which returned normal, per patient.    He has esophagus was stretched recently, which has helped.  He takes gabapentin 300mg  twice daily and feels that his speech and swallowing has improved, but not resolved.    He had one episode of what sounds like transient global amnesia in 2016 which lasted about an hour.   UPDATE 05/03/2017:  He is here to discuss the results of his testing which shows positive AChR antibodies, confirming diagnosis of myasthenia gravis, which was suspected.  He was started on mestinon and within an hour of his first dose, he noticed resolution of double vision.  He was able to increase mestinon to 60mg  three times daily (8am, 1pm, and 5pm) and has been tolerating it well; initially he has some diarrhea, which improved.  Swallowing has also improved some, but he has not appreciated any difference in his speech.  He denies any weakness.    UPDATE 06/08/2017:  He is here for follow-up visit.  Since increasing prednisone to 30mg  daily, he has noticed significant improvement in his speech and swallow. He is taking mestinon 7:30p, 12:30, and 5:30p and feels that  about 7hr after his dose, he sometimes get blurred vision, otherwise he has no weakness.  He is very happy with his progress and improvement.   UPDATE 07/20/2017:  He was able to successfully taper his prednisone down to 20mg  daily and has been on this for two weeks. He denies double vision, ptosis, jaw fatigue, difficulty swallowing/talking, or limb weakness.  He remains on mestinon 60mg  TID.  He is traveling to the beach in June and July to celebrate his 50th wedding anniversary.  Medications:  Current Outpatient Medications on File Prior to Visit  Medication Sig Dispense Refill  . aspirin EC 81 MG tablet Take 81 mg by mouth daily.    . Calcium Carbonate (CALCIUM 600 PO) Take by mouth.    . Ferrous Sulfate (SLOW FE PO) Take by mouth.    Marland Kitchen omeprazole (PRILOSEC) 20 MG capsule Take 20 mg by mouth daily.    . predniSONE (DELTASONE) 10 MG tablet Take 3 tablets (30 mg total) by mouth daily with breakfast. (Patient taking differently: Take 20 mg by mouth daily with breakfast. ) 100 tablet 3  . pyridostigmine (MESTINON) 60 MG tablet Take 1 tablet at 7am, 11am, 3pm, and 7pm. (Patient taking differently: 60 mg 3 (three) times daily. Take 1 tablet at 7am, 11am, 3pm, and 7pm.) 120 tablet 5  . ramipril (ALTACE) 5 MG capsule      No current facility-administered medications on file prior to visit.     Allergies:  Allergies  Allergen Reactions  . Demerol [Meperidine Hcl]  Review of Systems:  CONSTITUTIONAL: No fevers, chills, night sweats, or weight loss.  EYES: No visual changes or eye pain ENT: No hearing changes.  No history of nose bleeds.   RESPIRATORY: No cough, wheezing and shortness of breath.   CARDIOVASCULAR: Negative for chest pain, and palpitations.   GI: Negative for abdominal discomfort, blood in stools or black stools.  No recent change in bowel habits.   GU:  No history of incontinence.   MUSCLOSKELETAL: No history of joint pain or swelling.  No myalgias.   SKIN: Negative for  lesions, rash, and itching.   ENDOCRINE: Negative for cold or heat intolerance, polydipsia or goiter.   PSYCH:  No depression or anxiety symptoms.   NEURO: As Above.   Vital Signs:  BP 130/70   Pulse 60   Ht 5\' 6"  (1.676 m)   Wt 177 lb 6 oz (80.5 kg)   SpO2 97%   BMI 28.63 kg/m     General Medical Exam:   General:  Well appearing, comfortable  Neck: No carotid bruits. Respiratory:  Clear to auscultation, good air entry bilaterally.   Cardiac:  Regular rate and rhythm, no murmur.   Ext: No edema  Neurological Exam: MENTAL STATUS including orientation to time, place, person, recent and remote memory, attention span and concentration, language, and fund of knowledge is normal.  Speech is not dysarthric.  CRANIAL NERVES:  Pupils equal round and reactive to light.  Normal conjugate, extra-ocular eye movements in all directions of gaze. Mild left ptosis at baseline without worsening with sustained upgaze  Face is symmetric.  Facial muscles 5/5.Marland Kitchen Palate elevates symmetrically.  Tongue is midline.  MOTOR:  Motor strength is 5/5 in all extremities.   COORDINATION/GAIT:   Gait narrow based and stable.   Data: CT head 12/08/2016:  Unremarkable  Labs 04/20/2017:   AChR binding 9.20*, blocking 76*, and modulating 90*  CT chest wo contrast 05/04/2017:   1. Unremarkable CT examination the chest. No anterior mediastinal mass/thymoma is identified. 2. No acute pulmonary findings or worrisome pulmonary lesions. 3. Significant three-vessel coronary artery calcifications. 4. Cholelithiasis.   IMPRESSION/PLAN: Seropositive bulbar myasthenia gravis without exacerbation (onset 2018, diagnosed March 2019). Thymoma negative. His bulbar weakness markedly improved with prednisone to 30mg  daily and he has tolerating slow taper back down to 20mg /d.  Exam shows subtle left ptosis and otherwise normal, which is unchanged.  Overall, he is doing great.    - We will continue slow prednisone taper down to  20mg  alternating days with 15mg  daily x 2 weeks, then stay on 15mg  daily   - Continue mestinon 60mg  TID.  Ok to take extra dose as needed  - Follow-up with TPMT which was drawn at the [place]  - Continue calcium supplements  - Encouraged to stay well hydrated  Return to clinic in 2 months   Thank you for allowing me to participate in patient's care.  If I can answer any additional questions, I would be pleased to do so.    Sincerely,    Arloa Prak K. Allena Katz, DO

## 2017-07-20 NOTE — Patient Instructions (Addendum)
We will follow-up with your TPMP labs  Reduce prednisone to 20mg  alternating days with 15mg  for two weeks  Then stay on prednisone 15mg  daily until you see me again  Return to clinic on July 23 at 3:30pm.

## 2017-07-28 NOTE — Progress Notes (Signed)
Patient notified

## 2017-07-28 NOTE — Progress Notes (Signed)
Addendum  Labs received 06/29/2017:  TPMT  13 (normal).  Donika K. Allena Katz, DO

## 2017-08-15 ENCOUNTER — Other Ambulatory Visit: Payer: Self-pay | Admitting: *Deleted

## 2017-08-15 ENCOUNTER — Telehealth: Payer: Self-pay | Admitting: Neurology

## 2017-08-15 MED ORDER — OMEPRAZOLE 20 MG PO CPDR
20.0000 mg | DELAYED_RELEASE_CAPSULE | Freq: Every day | ORAL | 3 refills | Status: DC
Start: 2017-08-15 — End: 2018-07-22

## 2017-08-15 NOTE — Telephone Encounter (Signed)
Yes, ok to refill 

## 2017-08-15 NOTE — Telephone Encounter (Signed)
Patient called and [facility] is needing a new prescription for his Omeprazole 20 MG. He is needing a 90 day supply. Fax [phone] and phone # [phone]. Thanks

## 2017-08-15 NOTE — Telephone Encounter (Signed)
Do you want me to refill this???

## 2017-08-15 NOTE — Telephone Encounter (Signed)
Rx faxed

## 2017-09-13 ENCOUNTER — Ambulatory Visit: Payer: Medicare HMO | Admitting: Neurology

## 2017-09-13 ENCOUNTER — Encounter: Payer: Self-pay | Admitting: Neurology

## 2017-09-13 VITALS — BP 130/70 | HR 75 | Ht 66.0 in | Wt 180.0 lb

## 2017-09-13 DIAGNOSIS — G7000 Myasthenia gravis without (acute) exacerbation: Secondary | ICD-10-CM

## 2017-09-13 MED ORDER — PREDNISONE 5 MG PO TABS: ORAL_TABLET | ORAL | 3 refills | Status: DC

## 2017-09-13 NOTE — Progress Notes (Signed)
Follow-up Visit   Date: 09/13/17    Adrian Bell MRN: [id number] DOB: [birth year]   Interim History: Adrian Bell is a 72 y.o. right-handed Caucasian male with hypertension, kidney stones, and iron deficiency anemia returning to the clinic for follow-up of seropositive myasthenia gravis.  The patient was accompanied to the clinic by self.  History of present illness: Starting around the summer of 2018, he developed spells of slurred speech, difficulty swallowing, and double vision which is always worse by the afternoon and evening. Closing one eye would resolve his double vision. Slurred speech is worse after talking for longer periods of time. Rest always improves his symptoms.  He denies any arm or leg weakness.  He went to the emergency department in October and was evaluated for TIA with CT head, MRI brain, and echo which returned normal, per patient.    He has esophagus was stretched recently, which has helped.  He takes gabapentin 300mg  twice daily and feels that his speech and swallowing has improved, but not resolved.    He had one episode of what sounds like transient global amnesia in 2016 which lasted about an hour.   UPDATE 05/03/2017:  He is here to discuss the results of his testing which shows positive AChR antibodies, confirming diagnosis of myasthenia gravis, which was suspected.  He was started on mestinon and within an hour of his first dose, he noticed resolution of double vision.  He was able to increase mestinon to 60mg  three times daily (8am, 1pm, and 5pm) and has been tolerating it well; initially he has some diarrhea, which improved.  Swallowing has also improved some, but he has not appreciated any difference in his speech.  He denies any weakness.    UPDATE 06/08/2017:    Since increasing prednisone to 30mg  daily, he has noticed significant improvement in his speech and swallow. He is taking mestinon 7:30p, 12:30, and 5:30p and feels that about 7hr after his dose, he  sometimes get blurred vision, otherwise he has no weakness.  He is very happy with his progress and improvement.   UPDATE 07/20/2017:  He was able to successfully taper his prednisone down to 20mg  daily and has been on this for two weeks.  UPDATE 09/13/2017:  Mr. Adrian Bell is here with his wife for a follow-up visit.  He was able to taper his prednisone down to 15mg  daily and has been tolerating this well without any breakthrough weakness. He celebrated his 50th wedding anniversary with his wife in [place] and did not have any problems, despite the heat index. No problems with ptosis, double vision, speech changes, or limb weakness.   Medications:  Current Outpatient Medications on File Prior to Visit  Medication Sig Dispense Refill  . Calcium Carbonate (CALCIUM 600 PO) Take by mouth.    . Ferrous Sulfate (SLOW FE PO) Take by mouth.    Marland Kitchen omeprazole (PRILOSEC) 20 MG capsule Take 1 capsule (20 mg total) by mouth daily. 90 capsule 3  . pyridostigmine (MESTINON) 60 MG tablet Take 1 tablet at 7am, 11am, 3pm, and 7pm. (Patient taking differently: 60 mg 3 (three) times daily. Take 1 tablet at 7am, 11am, 3pm, and 7pm.) 120 tablet 5  . ramipril (ALTACE) 5 MG capsule      No current facility-administered medications on file prior to visit.     Allergies:  Allergies  Allergen Reactions  . Demerol [Meperidine Hcl]     Review of Systems:  CONSTITUTIONAL: No fevers, chills, night sweats,  or weight loss.  EYES: No visual changes or eye pain ENT: No hearing changes.  No history of nose bleeds.   RESPIRATORY: No cough, wheezing and shortness of breath.   CARDIOVASCULAR: Negative for chest pain, and palpitations.   GI: Negative for abdominal discomfort, blood in stools or black stools.  No recent change in bowel habits.   GU:  No history of incontinence.   MUSCLOSKELETAL: No history of joint pain or swelling.  No myalgias.   SKIN: Negative for lesions, rash, and itching.   ENDOCRINE: Negative for cold  or heat intolerance, polydipsia or goiter.   PSYCH:  No depression or anxiety symptoms.   NEURO: As Above.   Vital Signs:  BP 130/70   Pulse 75   Ht 5\' 6"  (1.676 m)   Wt 180 lb (81.6 kg)   SpO2 98%   BMI 29.05 kg/m     Neurological Exam: MENTAL STATUS including orientation to time, place, person, recent and remote memory, attention span and concentration, language, and fund of knowledge is normal.  Speech is not dysarthric.  CRANIAL NERVES:  Pupils equal round and reactive to light.  Normal conjugate, extra-ocular eye movements in all directions of gaze. No ptosis at baseline or with sustained upgaze.  Face is symmetric.  Facial muscles 5/5.Marland Kitchen Palate elevates symmetrically.  Tongue is midline.  MOTOR:  Motor strength is 5/5 in all extremities.   COORDINATION/GAIT:   Gait narrow based and stable.   Data: CT head 12/08/2016:  Unremarkable  Labs 04/20/2017:   AChR binding 9.20*, blocking 76*, and modulating 90*  CT chest wo contrast 05/04/2017:   1. Unremarkable CT examination the chest. No anterior mediastinal mass/thymoma is identified. 2. No acute pulmonary findings or worrisome pulmonary lesions. 3. Significant three-vessel coronary artery calcifications. 4. Cholelithiasis.  Labs received 06/29/2017:  TPMT  13 (normal).   IMPRESSION/PLAN: Seropositive bulbar myasthenia gravis without exacerbation (onset 2018, diagnosed March 2019). Thymoma negative. His bulbar weakness markedly improved with prednisone to 30mg  daily and he has tolerating slow taper.  Clinically, he is doing great without any weakness on exam.  - Continue to slowly taper prednisone down to 15mg  alternating with 10mg  x 2 weeks, then 10mg  daily x 2 weeks  - If he is doing well on 10mg /d for two week, plan to reduce prednisone to 10mg  alternating with 7.5mg  x 2 weeks, then stay on 7.5mg /d  - Continue mestinon 60mg  TID.  Ok to take extra dose as needed  - patient was counseled on MG breakthrough symptoms and to  contact my office and go back to taking higher dose of prednisone which controlled symptoms the best  Return to clinic in 2 months  Greater than 50% of this 30 minute visit was spent in counseling, explanation of diagnosis, planning of further management, and coordination of care.   Thank you for allowing me to participate in patient's care.  If I can answer any additional questions, I would be pleased to do so.    Sincerely,    Donika K. Allena Katz, DO

## 2017-09-13 NOTE — Patient Instructions (Addendum)
Reduce prednisone as follows: Week 1 - 2:  Take prednisone 15mg  and alternate days with 10mg   Week 3 - 4:  Take 10mg  daily Week 4-6:    Alternate 10mg  and 7.5mg   Thereafter, stay on 7.5mg  daily (1.5 tab)  If you develop any weakness, difficulty swallowing/speech changes, or vision changes, please go back to taking the dose that kept symptoms controlled.  Return to clinic in September 10 at 3:30pm

## 2017-09-20 DIAGNOSIS — N401 Enlarged prostate with lower urinary tract symptoms: Secondary | ICD-10-CM | POA: Diagnosis not present

## 2017-09-20 DIAGNOSIS — N318 Other neuromuscular dysfunction of bladder: Secondary | ICD-10-CM | POA: Diagnosis not present

## 2017-09-20 DIAGNOSIS — N3020 Other chronic cystitis without hematuria: Secondary | ICD-10-CM | POA: Diagnosis not present

## 2017-11-01 ENCOUNTER — Encounter: Payer: Self-pay | Admitting: Neurology

## 2017-11-01 ENCOUNTER — Ambulatory Visit: Payer: Medicare HMO | Admitting: Neurology

## 2017-11-01 VITALS — BP 140/70 | HR 65 | Ht 66.0 in | Wt 182.0 lb

## 2017-11-01 DIAGNOSIS — G7000 Myasthenia gravis without (acute) exacerbation: Secondary | ICD-10-CM | POA: Diagnosis not present

## 2017-11-01 NOTE — Progress Notes (Signed)
Follow-up Visit   Date: 11/01/17    Adrian Bell MRN: [id number] DOB: 12/17/1945   Interim History: Adrian Bell is a 72 y.o. right-handed Caucasian male with hypertension, kidney stones, and iron deficiency anemia returning to the clinic for follow-up of seropositive myasthenia gravis.  The patient was accompanied to the clinic by self.  History of present illness: Starting around the summer of 2018, he developed spells of slurred speech, difficulty swallowing, and double vision which is always worse by the afternoon and evening. Closing one eye would resolve his double vision. Slurred speech is worse after talking for longer periods of time. Rest always improves his symptoms.  He denies any arm or leg weakness.  He went to the emergency department in October and was evaluated for TIA with CT head, MRI brain, and echo which returned normal, per patient.    He has esophagus was stretched recently, which has helped.  He takes gabapentin 300mg  twice daily and feels that his speech and swallowing has improved, but not resolved.    He had one episode of what sounds like transient global amnesia in 2016 which lasted about an hour.   UPDATE 05/03/2017:  He is here to discuss the results of his testing which shows positive AChR antibodies, confirming diagnosis of myasthenia gravis, which was suspected.  He was started on mestinon and within an hour of his first dose, he noticed resolution of double vision.  He was able to increase mestinon to 60mg  three times daily (8am, 1pm, and 5pm) and has been tolerating it well; initially he has some diarrhea, which improved.  Swallowing has also improved some, but he has not appreciated any difference in his speech.  He denies any weakness.    UPDATE 06/08/2017:    Since increasing prednisone to 30mg  daily, he has noticed significant improvement in his speech and swallow. He is taking mestinon 7:30p, 12:30, and 5:30p and feels that about 7hr after his dose, he  sometimes get blurred vision, otherwise he has no weakness.  He is very happy with his progress and improvement.    In late April, prednisone was slowly weaned and he has been doing well without breakthrough weakness.   UPDATE 11/01/2017:  He is here with his wife for follow-up visit. He is doing great on tapering prednisone and down to 7.5mg  daily. He remains on mestinon 60mg  three times daily (8am, 1pm, 6pm).   No double vision, voice changes, or swallowing difficulty.    Medications:  Current Outpatient Medications on File Prior to Visit  Medication Sig Dispense Refill  . Calcium Carbonate (CALCIUM 600 PO) Take by mouth.    . Ferrous Sulfate (SLOW FE PO) Take by mouth.    Marland Kitchen omeprazole (PRILOSEC) 20 MG capsule Take 1 capsule (20 mg total) by mouth daily. 90 capsule 3  . predniSONE (DELTASONE) 5 MG tablet Take 10mg  (2 tab) for 2 weeks, then reduce to 10mg  alternating with 7.5mg  (1.5 tab) x 2 weeks, then stay on 7.5mg  daily. 180 tablet 3  . pyridostigmine (MESTINON) 60 MG tablet Take 1 tablet at 7am, 11am, 3pm, and 7pm. (Patient taking differently: 60 mg 3 (three) times daily. Take 1 tablet at 7am, 11am, 3pm, and 7pm.) 120 tablet 5  . ramipril (ALTACE) 5 MG capsule      No current facility-administered medications on file prior to visit.     Allergies:  Allergies  Allergen Reactions  . Demerol [Meperidine Hcl]     Review of Systems:  CONSTITUTIONAL: No fevers, chills, night sweats, or weight loss.  EYES: No visual changes or eye pain ENT: No hearing changes.  No history of nose bleeds.   RESPIRATORY: No cough, wheezing and shortness of breath.   CARDIOVASCULAR: Negative for chest pain, and palpitations.   GI: Negative for abdominal discomfort, blood in stools or black stools.  No recent change in bowel habits.   GU:  No history of incontinence.   MUSCLOSKELETAL: No history of joint pain or swelling.  No myalgias.   SKIN: Negative for lesions, rash, and itching.   ENDOCRINE: Negative  for cold or heat intolerance, polydipsia or goiter.   PSYCH:  No depression or anxiety symptoms.   NEURO: As Above.   Vital Signs:  BP 140/70   Pulse 65   Ht 5\' 6"  (1.676 m)   Wt 182 lb (82.6 kg)   SpO2 97%   BMI 29.38 kg/m   General Medical Exam:   General:  Well appearing, comfortable  Eyes/ENT: see cranial nerve examination.   Neck: No masses appreciated.  Full range of motion without tenderness.  No carotid bruits. Respiratory:  Clear to auscultation, good air entry bilaterally.   Cardiac:  Regular rate and rhythm, no murmur.   Ext:  No edema     Neurological Exam: MENTAL STATUS including orientation to time, place, person, recent and remote memory, attention span and concentration, language, and fund of knowledge is normal.  Speech is not dysarthric.  CRANIAL NERVES:  Pupils equal round and reactive to light.  Normal conjugate, extra-ocular eye movements in all directions of gaze. No ptosis at baseline or with sustained upgaze.  Face is symmetric.  Facial muscles 5/5. Palate elevates symmetrically.  Tongue is midline and strength is 5/5 throughout.   MOTOR:  Motor strength is 5/5 in all extremities. No fatigability.  COORDINATION/GAIT:   Gait narrow based and stable.   Data: CT head 12/08/2016:  Unremarkable  Labs 04/20/2017:   AChR binding 9.20*, blocking 76*, and modulating 90*  CT chest wo contrast 05/04/2017:   1. Unremarkable CT examination the chest. No anterior mediastinal mass/thymoma is identified. 2. No acute pulmonary findings or worrisome pulmonary lesions. 3. Significant three-vessel coronary artery calcifications. 4. Cholelithiasis.  Labs received 06/29/2017:  TPMT  13 (normal).   IMPRESSION/PLAN: Seropositive bulbar myasthenia gravis without exacerbation (onset 2018, diagnosed March 2019). Thymoma negative. His bulbar weakness markedly improved with prednisone to 30mg  daily and is doing excellent on steroid taper. He has no signs of weakness, despite  slowly reducing prednisone to 7.5mg  daily.    - Continue prednisone 7.5mg  daily for one more week, then reduce to 7.5mg  alternating with 5mg  daily x 2 weeks  - If tolerating, reduce prednisone 5mg  daily x 2 weeks, then 5mg  alternating with 2.5mg  daily and stay on this dose  - OK to reduce mestinon to 60mg  twice daily at 8am and 6pm.  Ok to take extra dose as needed  - If he develops weakness, he was instructed to go back to taking the dose prednisone which best controlled symptoms   Return to clinic in 2 months    Thank you for allowing me to participate in patient's care.  If I can answer any additional questions, I would be pleased to do so.    Sincerely,    Aara Jacquot K. Allena Katz, DO

## 2017-11-01 NOTE — Patient Instructions (Addendum)
Continue prednisone 7.5mg  daily for another week, then reduce to 7.5mg  alternating with 5mg  for two weeks If you are doing well, continue prednisone 5mg  daily for another two weeks, then reduce to 5mg  alternating with 2.5mg  daily  You can skip your afternoon mestinon and continue to take 1 tablet in the morning and evening.   Return to clinic on November 5th at 3:30pm

## 2017-12-16 ENCOUNTER — Telehealth: Payer: Self-pay | Admitting: Neurology

## 2017-12-16 NOTE — Telephone Encounter (Signed)
Patient called regarding his Pyridostigmine medication. He is needing it filled through the [place] in [place]. [facility]. He said he is having a hard time getting it filled. Thanks

## 2017-12-19 NOTE — Telephone Encounter (Signed)
Left message for patient to call me back and let me know if I need to mail the Rx to him.

## 2017-12-20 ENCOUNTER — Other Ambulatory Visit: Payer: Self-pay | Admitting: *Deleted

## 2017-12-20 MED ORDER — PYRIDOSTIGMINE BROMIDE 60 MG PO TABS: 60.0000 mg | ORAL_TABLET | Freq: Three times a day (TID) | ORAL | 3 refills | Status: DC

## 2017-12-20 NOTE — Telephone Encounter (Signed)
Rx mailed.

## 2017-12-27 ENCOUNTER — Ambulatory Visit: Payer: Medicare HMO | Admitting: Neurology

## 2017-12-27 ENCOUNTER — Encounter: Payer: Self-pay | Admitting: Neurology

## 2017-12-27 VITALS — BP 150/70 | HR 69 | Ht 66.0 in | Wt 177.4 lb

## 2017-12-27 DIAGNOSIS — G7000 Myasthenia gravis without (acute) exacerbation: Secondary | ICD-10-CM

## 2017-12-27 NOTE — Patient Instructions (Signed)
Increase prednisone to 5mg  daily  Continue mestinon 60mg  twice daily.  Return to clinic in January

## 2017-12-27 NOTE — Progress Notes (Signed)
Follow-up Visit   Date: 12/27/17    Adrian Bell MRN: [id number] DOB: [birth year]   Interim History: Adrian Bell is a 72 y.o. right-handed Caucasian male with hypertension, kidney stones, and iron deficiency anemia returning to the clinic for follow-up of seropositive myasthenia gravis.  The patient was accompanied to the clinic by self.  History of present illness: Starting around the summer of 2018, he developed spells of slurred speech, difficulty swallowing, and double vision which is always worse by the afternoon and evening. Closing one eye would resolve his double vision. Slurred speech is worse after talking for longer periods of time. Rest always improves his symptoms.  He denies any arm or leg weakness.  He went to the emergency department in October and was evaluated for TIA with CT head, MRI brain, and echo which returned normal, per patient.    He has esophagus was stretched recently, which has helped.  He takes gabapentin 300mg  twice daily and feels that his speech and swallowing has improved, but not resolved.    He had one episode of what sounds like transient global amnesia in 2016 which lasted about an hour.   UPDATE 05/03/2017:  He is here to discuss the results of his testing which shows positive AChR antibodies, confirming diagnosis of myasthenia gravis, which was suspected.  He was started on mestinon and within an hour of his first dose, he noticed resolution of double vision.  He was able to increase mestinon to 60mg  three times daily (8am, 1pm, and 5pm) and has been tolerating it well; initially he has some diarrhea, which improved.  Swallowing has also improved some, but he has not appreciated any difference in his speech.  He denies any weakness.    UPDATE 06/08/2017:    Since increasing prednisone to 30mg  daily, he has noticed significant improvement in his speech and swallow. He is taking mestinon 7:30p, 12:30, and 5:30p and feels that about 7hr after his dose, he  sometimes get blurred vision, otherwise he has no weakness.  He is very happy with his progress and improvement.    In late April, prednisone was slowly weaned and he has been doing well without breakthrough weakness.   UPDATE 11/01/2017:  He is here with his wife for follow-up visit. He is doing great on tapering prednisone and down to 7.5mg  daily. He remains on mestinon 60mg  three times daily (8am, 1pm, 6pm).   No double vision, voice changes, or swallowing difficulty.    UPDATE 12/27/2017:  He has been able to reduce prednisone down to 5mg  alternating with 2.5mg  and has been doing well.  About three days after dropping the dose, he noticed some problems with focusing on objects, no double vision, difficulty swallowing, or ptosis.  He also sees halos around car lights at night, so has restricted his night time driving.  He noticed that his legs soreness is improved on lower dose of prednisone.  He was also able to reduce mestinon to 60mg  twice daily.  Medications:  Current Outpatient Medications on File Prior to Visit  Medication Sig Dispense Refill  . Calcium Carbonate (CALCIUM 600 PO) Take by mouth.    . Ferrous Sulfate (SLOW FE PO) Take by mouth.    Marland Kitchen omeprazole (PRILOSEC) 20 MG capsule Take 1 capsule (20 mg total) by mouth daily. 90 capsule 3  . predniSONE (DELTASONE) 5 MG tablet Take 10mg  (2 tab) for 2 weeks, then reduce to 10mg  alternating with 7.5mg  (1.5 tab) x 2 weeks, then  stay on 7.5mg  daily. (Patient taking differently: Alternate 5 mg and 2.5 mg.) 180 tablet 3  . pyridostigmine (MESTINON) 60 MG tablet Take 1 tablet (60 mg total) by mouth 3 (three) times daily. Take 1 tablet at 7am, 11am, 3pm, and 7pm. (Patient taking differently: Take 60 mg by mouth 2 (two) times daily. Take 1 tablet at 7am, 11am, 3pm, and 7pm.) 270 tablet 3  . ramipril (ALTACE) 5 MG capsule      No current facility-administered medications on file prior to visit.     Allergies:  Allergies  Allergen Reactions  .  Demerol [Meperidine Hcl]     Review of Systems:  CONSTITUTIONAL: No fevers, chills, night sweats, or weight loss.  EYES: No visual changes or eye pain ENT: No hearing changes.  No history of nose bleeds.   RESPIRATORY: No cough, wheezing and shortness of breath.   CARDIOVASCULAR: Negative for chest pain, and palpitations.   GI: Negative for abdominal discomfort, blood in stools or black stools.  No recent change in bowel habits.   GU:  No history of incontinence.   MUSCLOSKELETAL: No history of joint pain or swelling.  No myalgias.   SKIN: Negative for lesions, rash, and itching.   ENDOCRINE: Negative for cold or heat intolerance, polydipsia or goiter.   PSYCH:  No depression or anxiety symptoms.   NEURO: As Above.   Vital Signs:  BP (!) 150/70   Pulse 69   Ht 5\' 6"  (1.676 m)   Wt 177 lb 6 oz (80.5 kg)   SpO2 96%   BMI 28.63 kg/m   General Medical Exam:   General:  Well appearing, comfortable  Eyes/ENT: see cranial nerve examination.   Neck: No masses appreciated.  Full range of motion without tenderness.  No carotid bruits. Respiratory:  Clear to auscultation, good air entry bilaterally.   Cardiac:  Regular rate and rhythm, no murmur.   Ext:  No edema  Neurological Exam: MENTAL STATUS including orientation to time, place, person, recent and remote memory, attention span and concentration, language, and fund of knowledge is normal.  Speech is not dysarthric.  CRANIAL NERVES:  Pupils equal round and reactive to light.  Normal conjugate, extra-ocular eye movements in all directions of gaze. Mild left ptosis.  Face is symmetric.  Facial muscles 5/5. Palate elevates symmetrically.  Tongue is midline and strength is 5/5 throughout.   MOTOR:  Motor strength is 5/5 in all extremities. Neck flexion is 5/5.  No fatigability.  COORDINATION/GAIT:   Gait narrow based and stable.   Data: CT head 12/08/2016:  Unremarkable  Labs 04/20/2017:   AChR binding 9.20*, blocking 76*, and  modulating 90*  CT chest wo contrast 05/04/2017:   1. Unremarkable CT examination the chest. No anterior mediastinal mass/thymoma is identified. 2. No acute pulmonary findings or worrisome pulmonary lesions. 3. Significant three-vessel coronary artery calcifications. 4. Cholelithiasis.  Labs received 06/29/2017:  TPMT  13 (normal).   IMPRESSION/PLAN: Seropositive bulbar myasthenia gravis without exacerbation (onset 2018, diagnosed March 2019). Thymoma negative. His bulbar weakness markedly improved with prednisone to 30mg  daily and continues to do well on steroid taper.  He is currently on prednisone 5mg  and 2.5mg  and noticed mild vision changes. Exam shows mild left ptosis, no ophthalmoplegia or limb weakness.  Although he denies double vision, I will be very cautious in further tapering and instead keep him on prednisone 5mg  daily.  Continue mestinon 60mg  at 8am and 6pm.  OK to take extra dose as needed  Return to clinic in 2 months    Thank you for allowing me to participate in patient's care.  If I can answer any additional questions, I would be pleased to do so.    Sincerely,    Keli Buehner K. Allena Katz, DO

## 2018-02-10 ENCOUNTER — Telehealth: Payer: Self-pay | Admitting: *Deleted

## 2018-02-10 NOTE — Telephone Encounter (Signed)
Patient called stating that he is feeling worse and would like to go back to taking prednisone altering 5 mg and 7.5 mg.  I called him back and had to leave a message instructing him to go up to 7.5 mg daily per Dr. Allena Katz.

## 2018-02-22 DIAGNOSIS — I219 Acute myocardial infarction, unspecified: Secondary | ICD-10-CM

## 2018-02-22 HISTORY — DX: Acute myocardial infarction, unspecified: I21.9

## 2018-03-07 NOTE — Progress Notes (Signed)
Follow-up Visit   Date: 03/08/18    Adrian Bell MRN: [id number] DOB: [birth year]   Interim History: Adrian Bell is a 74 y.o. right-handed Caucasian male with hypertension, kidney stones, and iron deficiency anemia returning to the clinic for follow-up of seropositive myasthenia gravis.  The patient was accompanied to the clinic by self.  History of present illness: Starting around the summer of 2018, he developed spells of slurred speech, difficulty swallowing, and double vision which is always worse by the afternoon and evening. Closing one eye would resolve his double vision. Slurred speech is worse after talking for longer periods of time. Rest always improves his symptoms.  He denies any arm or leg weakness.  He went to the emergency department in October and was evaluated for TIA with CT head, MRI brain, and echo which returned normal, per patient.  He was diagnosed with myasthenia after AChR returned positive. He was started on mestinon and within an hour of his first dose, he noticed resolution of double vision.  He was able to increase mestinon to 60mg  three times daily (8am, 1pm, and 5pm).  Prednisone was increased to 30mg  daily which controlled symptoms and he has been slowly tapered over the fall of 2019.   He had one episode of what sounds like transient global amnesia in 2016 which lasted about an hour.   UPDATE 12/27/2017:  He has been able to reduce prednisone down to 5mg  alternating with 2.5mg  and has been doing well.  About three days after dropping the dose, he noticed some problems with focusing on objects, no double vision, difficulty swallowing, or ptosis.  He also sees halos around car lights at night, so has restricted his night time driving.  He noticed that his legs soreness is improved on lower dose of prednisone.  He was also able to reduce mestinon to 60mg  twice daily.  UPDATE 03/08/2018:  He has been on a taper schedule of prednisone and unfortunately developed  breakthrough symptoms when he was on 7.5mg  alternating with 5mg .  He is back on prednisone 7.5mg  daily and reports doing well, no spells of difficulty swallowing/talking or ptosis.  He does not have double vision, but complains moreso of difficulty focusing, especially when driving at night.   Leg pain is much better after reducing mestinon to 60mg  twice daily.  Medications:  Current Outpatient Medications on File Prior to Visit  Medication Sig Dispense Refill  . Calcium Carbonate (CALCIUM 600 PO) Take by mouth.    . Ferrous Sulfate (SLOW FE PO) Take by mouth.    Marland Kitchen omeprazole (PRILOSEC) 20 MG capsule Take 1 capsule (20 mg total) by mouth daily. 90 capsule 3  . predniSONE (DELTASONE) 5 MG tablet Take 10mg  (2 tab) for 2 weeks, then reduce to 10mg  alternating with 7.5mg  (1.5 tab) x 2 weeks, then stay on 7.5mg  daily. (Patient taking differently: Alternate 5 mg and 2.5 mg.) 180 tablet 3  . pyridostigmine (MESTINON) 60 MG tablet Take 1 tablet (60 mg total) by mouth 3 (three) times daily. Take 1 tablet at 7am, 11am, 3pm, and 7pm. (Patient taking differently: Take 60 mg by mouth 2 (two) times daily. Take 1 tablet at 7am, 11am, 3pm, and 7pm.) 270 tablet 3  . ramipril (ALTACE) 5 MG capsule      No current facility-administered medications on file prior to visit.     Allergies:  Allergies  Allergen Reactions  . Demerol [Meperidine Hcl]     Review of Systems:  CONSTITUTIONAL: No  fevers, chills, night sweats, or weight loss.  EYES: No visual changes or eye pain ENT: No hearing changes.  No history of nose bleeds.   RESPIRATORY: No cough, wheezing and shortness of breath.   CARDIOVASCULAR: Negative for chest pain, and palpitations.   GI: Negative for abdominal discomfort, blood in stools or black stools.  No recent change in bowel habits.   GU:  No history of incontinence.   MUSCLOSKELETAL: No history of joint pain or swelling.  No myalgias.   SKIN: Negative for lesions, rash, and itching.     ENDOCRINE: Negative for cold or heat intolerance, polydipsia or goiter.   PSYCH:  No depression or anxiety symptoms.   NEURO: As Above.   Vital Signs:  BP 140/70   Pulse (!) 55   Ht 5\' 6"  (1.676 m)   Wt 178 lb (80.7 kg)   SpO2 98%   BMI 28.73 kg/m   General Medical Exam:   General:  Well appearing, comfortable  Eyes/ENT: see cranial nerve examination.   Neck:   Full range of motion without tenderness.  No carotid bruits. Respiratory:  Clear to auscultation, good air entry bilaterally.   Cardiac:  Regular rate and rhythm, no murmur.   Ext:  No edema  Neurological Exam: MENTAL STATUS including orientation to time, place, person, recent and remote memory, attention span and concentration, language, and fund of knowledge is normal.  Speech is not dysarthric.  CRANIAL NERVES:  Pupils equal round and reactive to light.  Normal conjugate, extra-ocular eye movements in all directions of gaze. Mild left ptosis.  Face is symmetric.  Facial muscles are 5/5 throughout.  Palate elevates symmetrically.  Tongue is midline.   MOTOR:  Motor strength is 5/5 in all extremities. No fatigability.  COORDINATION/GAIT:   Gait narrow based and stable.   Data: CT head 12/08/2016:  Unremarkable  Labs 04/20/2017:   AChR binding 9.20*, blocking 76*, and modulating 90*  CT chest wo contrast 05/04/2017:   1. Unremarkable CT examination the chest. No anterior mediastinal mass/thymoma is identified. 2. No acute pulmonary findings or worrisome pulmonary lesions. 3. Significant three-vessel coronary artery calcifications. 4. Cholelithiasis.  Labs received 06/29/2017:  TPMT  13 (normal).   IMPRESSION/PLAN: Seropositive bulbar myasthenia gravis without exacerbation (onset 2018, diagnosed March 2019). Thymoma negative. His bulbar weakness markedly improved with prednisone to 30mg  daily and continues to do well on steroid taper, except become symptomatic at 5mg /d.  He is on prednisone 7.5mg  daily and only  complaints of vision changes, described as difficulty focusing especially at night.  No double vision.  He saw his eye doctor 3 months ago with normal exam.  Plan to increase prednisone to 10mg  alternating with 7.5mg  daily  If no improvement and I am unable to taper prednisone, will need to start azathioprine  Chronic corticosteroid use.  Continue calcium and prilosec 20mg  daily.   Return to clinic in 4-6 weeks.    Thank you for allowing me to participate in patient's care.  If I can answer any additional questions, I would be pleased to do so.    Sincerely,     K. Allena Katz, DO

## 2018-03-08 ENCOUNTER — Encounter: Payer: Self-pay | Admitting: Neurology

## 2018-03-08 ENCOUNTER — Ambulatory Visit: Payer: Medicare HMO | Admitting: Neurology

## 2018-03-08 VITALS — BP 140/70 | HR 55 | Ht 66.0 in | Wt 178.0 lb

## 2018-03-08 DIAGNOSIS — G7000 Myasthenia gravis without (acute) exacerbation: Secondary | ICD-10-CM

## 2018-03-08 DIAGNOSIS — Z7952 Long term (current) use of systemic steroids: Secondary | ICD-10-CM | POA: Diagnosis not present

## 2018-03-08 HISTORY — DX: Long term (current) use of systemic steroids: Z79.52

## 2018-03-08 NOTE — Patient Instructions (Addendum)
Increase prednisone to 10mg  alternating with 7.5mg  daily  Return to clinic in 1 month

## 2018-04-05 ENCOUNTER — Ambulatory Visit: Payer: Medicare HMO | Admitting: Neurology

## 2018-04-20 NOTE — Progress Notes (Signed)
Follow-up Visit   Date: 04/21/18    Adrian Bell MRN: [id number] DOB: 01-03-1946   Interim History: Adrian Bell is a 73 y.o. right-handed Caucasian male with hypertension, kidney stones, and iron deficiency anemia returning to the clinic for follow-up of seropositive myasthenia gravis.  The patient was accompanied to the clinic by self.  History of present illness: Starting around the summer of 2018, he developed spells of slurred speech, difficulty swallowing, and double vision which is always worse by the afternoon and evening. Closing one eye would resolve his double vision. Slurred speech is worse after talking for longer periods of time. Rest always improves his symptoms.  He denies any arm or leg weakness.  He went to the emergency department in October and was evaluated for TIA with CT head, MRI brain, and echo which returned normal, per patient.  He was diagnosed with myasthenia after AChR returned positive. He was started on mestinon and within an hour of his first dose, he noticed resolution of double vision.  He was able to increase mestinon to 60mg  three times daily (8am, 1pm, and 5pm).  Prednisone was increased to 30mg  daily which controlled symptoms and he has been slowly tapered over the fall of 2019.   He had one episode of what sounds like transient global amnesia in 2016 which lasted about an hour.   UPDATE 12/27/2017:  He has been able to reduce prednisone down to 5mg  alternating with 2.5mg  and has been doing well.  About three days after dropping the dose, he noticed some problems with focusing on objects, no double vision, difficulty swallowing, or ptosis.  He also sees halos around car lights at night, so has restricted his night time driving.  He noticed that his legs soreness is improved on lower dose of prednisone.  He was also able to reduce mestinon to 60mg  twice daily.  UPDATE 03/08/2018:  He has been on a taper schedule of prednisone and unfortunately developed  breakthrough symptoms when he was on 7.5mg  alternating with 5mg .  He is back on prednisone 7.5mg  daily and reports doing well, no spells of difficulty swallowing/talking or ptosis.  He does not have double vision, but complains moreso of difficulty focusing, especially when driving at night.   Leg pain is much better after reducing mestinon to 60mg  twice daily.  UPDATE 04/21/2018:  At his last visit, I increased prednisone to 10mg  alternating with 7.5mg  daily due to vision changes, and he has noticed very slight improvement.  He continues to see halos around lights at night when driving and in the evening, he has difficulty focusing on the TV.  He does not have double vision, droopy eyelids, difficulty swallowing or talking.    He does have a upcoming eye appointment at the [place]  in 1 month.     Medications:  Current Outpatient Medications on File Prior to Visit  Medication Sig Dispense Refill  . Calcium Carbonate (CALCIUM 600 PO) Take by mouth.    . Ferrous Sulfate (SLOW FE PO) Take by mouth.    Marland Kitchen omeprazole (PRILOSEC) 20 MG capsule Take 1 capsule (20 mg total) by mouth daily. 90 capsule 3  . predniSONE (DELTASONE) 5 MG tablet Take 10mg  (2 tab) for 2 weeks, then reduce to 10mg  alternating with 7.5mg  (1.5 tab) x 2 weeks, then stay on 7.5mg  daily. (Patient taking differently: Alternate 5 mg and 2.5 mg.) 180 tablet 3  . pyridostigmine (MESTINON) 60 MG tablet Take 1 tablet (60 mg total) by  mouth 3 (three) times daily. Take 1 tablet at 7am, 11am, 3pm, and 7pm. (Patient taking differently: Take 60 mg by mouth 2 (two) times daily. Take 1 tablet at 7am, 11am, 3pm, and 7pm.) 270 tablet 3  . ramipril (ALTACE) 5 MG capsule      No current facility-administered medications on file prior to visit.     Allergies:  Allergies  Allergen Reactions  . Demerol [Meperidine Hcl]     Review of Systems:  CONSTITUTIONAL: No fevers, chills, night sweats, or weight loss.  EYES: No visual changes or eye pain ENT: No  hearing changes.  No history of nose bleeds.   RESPIRATORY: No cough, wheezing and shortness of breath.   CARDIOVASCULAR: Negative for chest pain, and palpitations.   GI: Negative for abdominal discomfort, blood in stools or black stools.  No recent change in bowel habits.   GU:  No history of incontinence.   MUSCLOSKELETAL: No history of joint pain or swelling.  No myalgias.   SKIN: Negative for lesions, rash, and itching.   ENDOCRINE: Negative for cold or heat intolerance, polydipsia or goiter.   PSYCH:  No depression or anxiety symptoms.   NEURO: As Above.   Vital Signs:  BP 140/62   Pulse 73   Ht 5\' 6"  (1.676 m)   Wt 171 lb (77.6 kg)   BMI 27.60 kg/m   General Medical Exam:    General:  Well appearing, comfortable  Eyes/ENT: see cranial nerve examination.   Neck:   No carotid bruits. Respiratory:  Clear to auscultation, good air entry bilaterally.   Cardiac:  Regular rate and rhythm, no murmur.   Ext:  No edema  Neurological Exam: MENTAL STATUS including orientation to time, place, person, recent and remote memory, attention span and concentration, language, and fund of knowledge is normal.  Speech is not dysarthric.  CRANIAL NERVES:  Pupils equal round and reactive to light.  Normal conjugate, extra-ocular eye movements in all directions of gaze. Mild left ptosis.  Face is symmetric.  Facial muscles are 5/5 throughout.  Palate elevates symmetrically.  Tongue is midline.   MOTOR:  Motor strength is 5/5 in all extremities. No fatigability.  COORDINATION/GAIT:   Gait narrow based and stable.   Data: CT head 12/08/2016:  Unremarkable  Labs 04/20/2017:   AChR binding 9.20*, blocking 76*, and modulating 90*  CT chest wo contrast 05/04/2017:   1. Unremarkable CT examination the chest. No anterior mediastinal mass/thymoma is identified. 2. No acute pulmonary findings or worrisome pulmonary lesions. 3. Significant three-vessel coronary artery calcifications. 4.  Cholelithiasis.  Labs received 06/29/2017:  TPMT  13 (normal).   IMPRESSION/PLAN: Seropositive bulbar myasthenia gravis without exacerbation (onset 2018, diagnosed March 2019). Thymoma negative. His bulbar weakness markedly improved with prednisone to 30mg  daily and continues to do well on steroid taper, except become symptomatic at 5mg /d.  His prednisone was increased due to new complaints of changes in vision, described as seeing halos around lights at nighttime when driving.  This visual symptom is not associated with myasthenia, as I would expect to see double vision due to muscle weakness. I will increase his prednisone to 10 mg daily, if there is no improvement with increasing his prednisone, this suggests visual symptoms are most likely due to intrinsic eye problem. Recommend follow-up with eye doctor as planned. Continue mestinon 60mg  twice daily  Chronic corticosteroid use.  Continue calcium and prilosec 20mg  daily.     Thank you for allowing me to participate in patient's  care.  If I can answer any additional questions, I would be pleased to do so.    Sincerely,    Sindia Kowalczyk K. Posey Pronto, DO

## 2018-04-21 ENCOUNTER — Ambulatory Visit: Payer: Medicare HMO | Admitting: Neurology

## 2018-04-21 ENCOUNTER — Other Ambulatory Visit: Payer: Self-pay

## 2018-04-21 ENCOUNTER — Encounter: Payer: Self-pay | Admitting: Neurology

## 2018-04-21 VITALS — BP 140/62 | HR 73 | Ht 66.0 in | Wt 171.0 lb

## 2018-04-21 DIAGNOSIS — G7000 Myasthenia gravis without (acute) exacerbation: Secondary | ICD-10-CM

## 2018-04-21 DIAGNOSIS — Z7952 Long term (current) use of systemic steroids: Secondary | ICD-10-CM | POA: Diagnosis not present

## 2018-04-21 DIAGNOSIS — H539 Unspecified visual disturbance: Secondary | ICD-10-CM

## 2018-04-21 NOTE — Patient Instructions (Addendum)
Increase prednisone 10mg  daily  If you continue to see halos around lights, you may want to talk to your eye doctor   Return to clinic in 6 weeks

## 2018-05-23 ENCOUNTER — Encounter: Payer: Self-pay | Admitting: *Deleted

## 2018-05-23 ENCOUNTER — Telehealth (INDEPENDENT_AMBULATORY_CARE_PROVIDER_SITE_OTHER): Payer: Medicare HMO | Admitting: Neurology

## 2018-05-23 ENCOUNTER — Encounter: Payer: Self-pay | Admitting: Neurology

## 2018-05-23 ENCOUNTER — Other Ambulatory Visit: Payer: Self-pay

## 2018-05-23 VITALS — BP 149/62 | Ht 66.0 in | Wt 168.0 lb

## 2018-05-23 DIAGNOSIS — G7000 Myasthenia gravis without (acute) exacerbation: Secondary | ICD-10-CM | POA: Diagnosis not present

## 2018-05-23 NOTE — Progress Notes (Signed)
    Virtual Visit via Telephone Note The purpose of this virtual visit is to provide medical care while limiting exposure to the novel coronavirus.    Consent was obtained for phone visit:  Yes.   Answered questions that patient had about telehealth interaction:  Yes.   I discussed the limitations, risks, security and privacy concerns of performing an evaluation and management service by telephone. I also discussed with the patient that there may be a patient responsible charge related to this service. The patient expressed understanding and agreed to proceed.  Pt location: Home Physician Location: office Name of referring provider:  [facility] I connected with Adrian Bell at patients initiation/request on 05/23/2018 at 10:00 AM EDT by telephone and verified that I am speaking with the correct person using two identifiers.  Pt MRN:  [id number] Pt DOB:  [birth year]   History of Present Illness:  This is a 73 year old man returning for follow-up of seropositive myasthenia gravis.  At his last visit, I increased prednisone to 10 mg daily, due to complaints vision changes described as halos and around lights at nighttime.  I did not feel this was truly related to myasthenia, however he noticed symptoms started following steroid taper (5mg /d).  I have asked him to follow-up with eye care specialist to be sure this is not due to an intrinsic eye problem such as cataracts, but due to COVID19, his appointment was rescheduled to June.  He denies any double vision, difficulty swallowing, or slurred speech.  He denies any limb weakness.  Observations/Objective:   Speech is clear and coherent.  Lingual and guttural sounds are normal.  Assessment and Plan:   Seropositive bulbar myasthenia gravis without exacerbation, onset 2018, diagnosed March 2019. Reduce prednisone 10 mg and alternate with 7.5mg  x 2 week, 7.5mg  daily Continue Mestinon 60 mg twice daily. Follow-up with eye care specialist for  evaluation of visual symptoms, as halos are not indicative of worsening myasthenia gravis. Continue calcium, vitamin D and Prilosec 20 mg daily for chronic steroid use. Plan to taper prednisone going forward (lowest dose prednisone 5mg ).  Follow Up Instructions: Follow-up visit in 4 weeks.  I discussed the assessment and treatment plan with the patient. The patient was provided an opportunity to ask questions and all were answered. The patient agreed with the plan and demonstrated an understanding of the instructions.   The patient was advised to call back or seek an in-person evaluation if the symptoms worsen or if the condition fails to improve as anticipated.    Total Time spent in visit with the patient was:  7 minutes, of which 100% of the time was spent in counseling and/or coordinating care.   Pt understands and agrees with the plan of care outlined.     Glendale Chard, DO

## 2018-06-07 ENCOUNTER — Ambulatory Visit: Payer: Medicare HMO | Admitting: Neurology

## 2018-06-26 ENCOUNTER — Telehealth (INDEPENDENT_AMBULATORY_CARE_PROVIDER_SITE_OTHER): Payer: Medicare HMO | Admitting: Neurology

## 2018-06-26 ENCOUNTER — Other Ambulatory Visit: Payer: Self-pay

## 2018-06-26 ENCOUNTER — Encounter: Payer: Self-pay | Admitting: *Deleted

## 2018-06-26 ENCOUNTER — Encounter: Payer: Self-pay | Admitting: Neurology

## 2018-06-26 VITALS — Ht 65.5 in | Wt 167.0 lb

## 2018-06-26 DIAGNOSIS — G7000 Myasthenia gravis without (acute) exacerbation: Secondary | ICD-10-CM

## 2018-06-26 NOTE — Progress Notes (Signed)
    Virtual Visit via Telephone Note The purpose of this virtual visit is to provide medical care while limiting exposure to the novel coronavirus.    Consent was obtained for phone visit:  Yes.   Answered questions that patient had about telehealth interaction:  Yes.   I discussed the limitations, risks, security and privacy concerns of performing an evaluation and management service by telephone. I also discussed with the patient that there may be a patient responsible charge related to this service. The patient expressed understanding and agreed to proceed.  Pt location: Home Physician Location: office Name of referring provider:  [facility] I connected with .[name] at patients initiation/request on 06/26/2018 at 10:30 AM EDT by telephone and verified that I am speaking with the correct person using two identifiers.  Pt MRN:  [id number] Pt DOB:  1945/10/30   History of Present Illness: This is a 73 year old man returning for follow-up of seropositive myasthenia gravis.  Earlier this year, his prednisone was increased to 10 mg daily due to new visual complaints of seeing halos, however despite increasing his corticosteroids, his symptoms remain unchanged and therefore unlikely related to myasthenia.  At his last visit in March, I recommended reducing prednisone to 10 mg alternating with 7.5 mg daily x 2 weeks then 7.5 mg daily.  He has been doing great on the tapering dose and denies any new weakness.  He continues to have vision changes of acuity and halos around the eye and has upcoming eye appointment in June.    Observations/Objective: He is awake and oriented.  Speech is clear, no dysarthria.  BP 146/62, 165lb   Assessment and Plan:   Seropositive bulbar myasthenia gravis without exacerbation (March 2019).  Clinically doing very well. Reduce prednisone to 7.5 mg and alternate with 5 mg x 2 week, then reduce to 5 mg daily.  Stay on this dose until follow-up with me in a 2  months. Continue Mestinon 60 mg twice daily. Warning signs for pseudo-exacerbation in warmer temperatures was discussed.  He was counseled to stay cool and keep ice water when outside.    Follow Up Instructions:   I discussed the assessment and treatment plan with the patient. The patient was provided an opportunity to ask questions and all were answered. The patient agreed with the plan and demonstrated an understanding of the instructions.   The patient was advised to call back or seek an in-person evaluation if the symptoms worsen or if the condition fails to improve as anticipated.    Total Time spent in visit with the patient was:  12 min, of which 100% of the time was spent in counseling and/or coordinating care.   Pt understands and agrees with the plan of care outlined.     Glendale Chard, DO

## 2018-07-21 ENCOUNTER — Inpatient Hospital Stay (HOSPITAL_COMMUNITY)
Admission: EM | Admit: 2018-07-21 | Discharge: 2018-07-24 | DRG: 247 | Disposition: A | Discharge status: Home / Self Care | Payer: Medicare HMO | Source: Other Acute Inpatient Hospital | Attending: Interventional Cardiology | Admitting: Interventional Cardiology

## 2018-07-21 ENCOUNTER — Encounter (HOSPITAL_COMMUNITY)
Admission: EM | Disposition: A | Discharge status: Home / Self Care | Payer: Self-pay | Source: Other Acute Inpatient Hospital | Attending: Interventional Cardiology

## 2018-07-21 DIAGNOSIS — R0602 Shortness of breath: Secondary | ICD-10-CM | POA: Diagnosis not present

## 2018-07-21 DIAGNOSIS — Z1159 Encounter for screening for other viral diseases: Secondary | ICD-10-CM

## 2018-07-21 DIAGNOSIS — Z87442 Personal history of urinary calculi: Secondary | ICD-10-CM | POA: Diagnosis not present

## 2018-07-21 DIAGNOSIS — E785 Hyperlipidemia, unspecified: Secondary | ICD-10-CM | POA: Diagnosis present

## 2018-07-21 DIAGNOSIS — G7000 Myasthenia gravis without (acute) exacerbation: Secondary | ICD-10-CM | POA: Diagnosis present

## 2018-07-21 DIAGNOSIS — Z800 Family history of malignant neoplasm of digestive organs: Secondary | ICD-10-CM | POA: Diagnosis not present

## 2018-07-21 DIAGNOSIS — Z8673 Personal history of transient ischemic attack (TIA), and cerebral infarction without residual deficits: Secondary | ICD-10-CM | POA: Diagnosis not present

## 2018-07-21 DIAGNOSIS — I2119 ST elevation (STEMI) myocardial infarction involving other coronary artery of inferior wall: Secondary | ICD-10-CM | POA: Diagnosis not present

## 2018-07-21 DIAGNOSIS — I213 ST elevation (STEMI) myocardial infarction of unspecified site: Secondary | ICD-10-CM | POA: Diagnosis not present

## 2018-07-21 DIAGNOSIS — Z885 Allergy status to narcotic agent status: Secondary | ICD-10-CM

## 2018-07-21 DIAGNOSIS — I495 Sick sinus syndrome: Secondary | ICD-10-CM | POA: Diagnosis present

## 2018-07-21 DIAGNOSIS — R079 Chest pain, unspecified: Secondary | ICD-10-CM | POA: Diagnosis not present

## 2018-07-21 DIAGNOSIS — Z79899 Other long term (current) drug therapy: Secondary | ICD-10-CM | POA: Diagnosis not present

## 2018-07-21 DIAGNOSIS — Z8249 Family history of ischemic heart disease and other diseases of the circulatory system: Secondary | ICD-10-CM

## 2018-07-21 DIAGNOSIS — I2111 ST elevation (STEMI) myocardial infarction involving right coronary artery: Secondary | ICD-10-CM

## 2018-07-21 DIAGNOSIS — Z955 Presence of coronary angioplasty implant and graft: Secondary | ICD-10-CM

## 2018-07-21 DIAGNOSIS — I10 Essential (primary) hypertension: Secondary | ICD-10-CM | POA: Diagnosis present

## 2018-07-21 DIAGNOSIS — I2510 Atherosclerotic heart disease of native coronary artery without angina pectoris: Secondary | ICD-10-CM | POA: Diagnosis present

## 2018-07-21 DIAGNOSIS — I219 Acute myocardial infarction, unspecified: Secondary | ICD-10-CM | POA: Diagnosis present

## 2018-07-21 DIAGNOSIS — R001 Bradycardia, unspecified: Secondary | ICD-10-CM | POA: Diagnosis not present

## 2018-07-21 DIAGNOSIS — I340 Nonrheumatic mitral (valve) insufficiency: Secondary | ICD-10-CM | POA: Diagnosis not present

## 2018-07-21 HISTORY — PX: CORONARY/GRAFT ACUTE MI REVASCULARIZATION: CATH118305

## 2018-07-21 HISTORY — PX: LEFT HEART CATH AND CORONARY ANGIOGRAPHY: CATH118249

## 2018-07-21 HISTORY — DX: Hyperlipidemia, unspecified: E78.5

## 2018-07-21 HISTORY — DX: ST elevation (STEMI) myocardial infarction involving other coronary artery of inferior wall: I21.19

## 2018-07-21 HISTORY — DX: ST elevation (STEMI) myocardial infarction of unspecified site: I21.3

## 2018-07-21 LAB — LIPID PANEL
Cholesterol: 180 mg/dL (ref 0–200)
HDL: 59 mg/dL (ref >40)
LDL Cholesterol: 96 mg/dL (ref 0–99)
Total CHOL/HDL Ratio: 3.1 RATIO
Triglycerides: 127 mg/dL (ref <150)
VLDL: 25 mg/dL (ref 0–40)

## 2018-07-21 LAB — CBC
HCT: 35.2 % — ABNORMAL LOW (ref 39.0–52.0)
Hemoglobin: 12 g/dL — ABNORMAL LOW (ref 13.0–17.0)
MCH: 31.4 pg (ref 26.0–34.0)
MCHC: 34.1 g/dL (ref 30.0–36.0)
MCV: 92.1 fL (ref 80.0–100.0)
Platelets: 220 10*3/uL (ref 150–400)
RBC: 3.82 MIL/uL — ABNORMAL LOW (ref 4.22–5.81)
RDW: 12.6 % (ref 11.5–15.5)
WBC: 8.8 10*3/uL (ref 4.0–10.5)
nRBC: 0 % (ref 0.0–0.2)

## 2018-07-21 LAB — HEMOGLOBIN A1C
Hgb A1c MFr Bld: 5.5 % (ref 4.8–5.6)
Mean Plasma Glucose: 111.15 mg/dL

## 2018-07-21 LAB — SARS CORONAVIRUS 2 BY RT PCR (HOSPITAL ORDER, PERFORMED IN ~~LOC~~ HOSPITAL LAB): SARS Coronavirus 2: NEGATIVE

## 2018-07-21 LAB — APTT: aPTT: 26 seconds (ref 24–36)

## 2018-07-21 LAB — PROTIME-INR
INR: 1.1 (ref 0.8–1.2)
Prothrombin Time: 14 seconds (ref 11.4–15.2)

## 2018-07-21 SURGERY — CORONARY/GRAFT ACUTE MI REVASCULARIZATION
Anesthesia: LOCAL

## 2018-07-21 MED ORDER — SODIUM CHLORIDE 0.9% FLUSH: 3.0000 mL | INTRAVENOUS | Status: DC | PRN

## 2018-07-21 MED ORDER — FENTANYL CITRATE (PF) 100 MCG/2ML IJ SOLN
INTRAMUSCULAR | Status: DC | PRN
  Administered 2018-07-21: 50 ug via INTRAVENOUS
  Administered 2018-07-21: 25 ug via INTRAVENOUS

## 2018-07-21 MED ORDER — FENTANYL CITRATE (PF) 100 MCG/2ML IJ SOLN
INTRAMUSCULAR | Status: AC
  Filled 2018-07-21: qty 2

## 2018-07-21 MED ORDER — IOHEXOL 350 MG/ML SOLN
INTRAVENOUS | Status: DC | PRN
  Administered 2018-07-21: 100 mL via INTRA_ARTERIAL

## 2018-07-21 MED ORDER — TIROFIBAN HCL IN NACL 5-0.9 MG/100ML-% IV SOLN
INTRAVENOUS | Status: AC
  Filled 2018-07-21: qty 100

## 2018-07-21 MED ORDER — VERAPAMIL HCL 2.5 MG/ML IV SOLN
INTRAVENOUS | Status: AC
  Filled 2018-07-21: qty 2

## 2018-07-21 MED ORDER — PREDNISONE 5 MG PO TABS
7.5000 mg | ORAL_TABLET | Freq: Every day | ORAL | Status: DC
  Administered 2018-07-22 – 2018-07-24 (×3): 7.5 mg via ORAL
  Filled 2018-07-21: qty 1
  Filled 2018-07-21: qty 2
  Filled 2018-07-21: qty 1

## 2018-07-21 MED ORDER — TICAGRELOR 90 MG PO TABS
90.0000 mg | ORAL_TABLET | Freq: Two times a day (BID) | ORAL | Status: DC
  Administered 2018-07-22 – 2018-07-24 (×5): 90 mg via ORAL
  Filled 2018-07-21 (×5): qty 1

## 2018-07-21 MED ORDER — SODIUM CHLORIDE 0.9 % IV SOLN
INTRAVENOUS | Status: AC | PRN
  Administered 2018-07-21: 10 mL/h via INTRAVENOUS

## 2018-07-21 MED ORDER — HEPARIN (PORCINE) IN NACL 1000-0.9 UT/500ML-% IV SOLN
INTRAVENOUS | Status: AC
  Filled 2018-07-21: qty 500

## 2018-07-21 MED ORDER — METOPROLOL TARTRATE 12.5 MG HALF TABLET: 12.5000 mg | ORAL_TABLET | Freq: Two times a day (BID) | ORAL | Status: DC

## 2018-07-21 MED ORDER — TIROFIBAN HCL IN NACL 5-0.9 MG/100ML-% IV SOLN
INTRAVENOUS | Status: AC | PRN
  Administered 2018-07-21: 0.15 ug/kg/min via INTRAVENOUS

## 2018-07-21 MED ORDER — SODIUM CHLORIDE 0.9 % IV SOLN
INTRAVENOUS | Status: AC
  Administered 2018-07-21: 100 mL/h via INTRAVENOUS

## 2018-07-21 MED ORDER — IOHEXOL 350 MG/ML SOLN
INTRAVENOUS | Status: DC | PRN
  Administered 2018-07-21: 23:00:00 100 mL via INTRACARDIAC

## 2018-07-21 MED ORDER — LIDOCAINE HCL (PF) 1 % IJ SOLN
INTRAMUSCULAR | Status: AC
  Filled 2018-07-21: qty 30

## 2018-07-21 MED ORDER — LIDOCAINE HCL (PF) 1 % IJ SOLN
INTRAMUSCULAR | Status: DC | PRN
  Administered 2018-07-21: 2 mL via INTRADERMAL

## 2018-07-21 MED ORDER — ONDANSETRON HCL 4 MG/2ML IJ SOLN
4.0000 mg | Freq: Four times a day (QID) | INTRAMUSCULAR | Status: DC | PRN
  Administered 2018-07-22: 4 mg via INTRAVENOUS
  Filled 2018-07-21: qty 2

## 2018-07-21 MED ORDER — SODIUM CHLORIDE 0.9% FLUSH
3.0000 mL | Freq: Two times a day (BID) | INTRAVENOUS | Status: DC
  Administered 2018-07-22 – 2018-07-24 (×5): 3 mL via INTRAVENOUS

## 2018-07-21 MED ORDER — TICAGRELOR 90 MG PO TABS
ORAL_TABLET | ORAL | Status: DC | PRN
  Administered 2018-07-21: 180 mg via ORAL

## 2018-07-21 MED ORDER — ATORVASTATIN CALCIUM 80 MG PO TABS
80.0000 mg | ORAL_TABLET | Freq: Every day | ORAL | Status: DC
  Administered 2018-07-22: 80 mg via ORAL
  Filled 2018-07-21: qty 1

## 2018-07-21 MED ORDER — ASPIRIN 81 MG PO CHEW
81.0000 mg | CHEWABLE_TABLET | Freq: Every day | ORAL | Status: DC
  Administered 2018-07-22 – 2018-07-24 (×3): 81 mg via ORAL
  Filled 2018-07-21 (×3): qty 1

## 2018-07-21 MED ORDER — SODIUM CHLORIDE 0.9 % IV SOLN: 250.0000 mL | INTRAVENOUS | Status: DC | PRN

## 2018-07-21 MED ORDER — LABETALOL HCL 5 MG/ML IV SOLN: 10.0000 mg | INTRAVENOUS | Status: AC | PRN

## 2018-07-21 MED ORDER — TIROFIBAN (AGGRASTAT) BOLUS VIA INFUSION
INTRAVENOUS | Status: DC | PRN
  Administered 2018-07-21: 2000 ug via INTRAVENOUS

## 2018-07-21 MED ORDER — VERAPAMIL HCL 2.5 MG/ML IV SOLN
INTRAVENOUS | Status: DC | PRN
  Administered 2018-07-21: 10 mL via INTRA_ARTERIAL

## 2018-07-21 MED ORDER — TICAGRELOR 90 MG PO TABS
ORAL_TABLET | ORAL | Status: AC
  Filled 2018-07-21: qty 2

## 2018-07-21 MED ORDER — HEPARIN (PORCINE) IN NACL 1000-0.9 UT/500ML-% IV SOLN
INTRAVENOUS | Status: DC | PRN
  Administered 2018-07-21 (×2): 500 mL

## 2018-07-21 MED ORDER — ACETAMINOPHEN 325 MG PO TABS
650.0000 mg | ORAL_TABLET | ORAL | Status: DC | PRN
  Administered 2018-07-22: 650 mg via ORAL
  Filled 2018-07-21: qty 2

## 2018-07-21 MED ORDER — HYDRALAZINE HCL 20 MG/ML IJ SOLN: 10.0000 mg | INTRAMUSCULAR | Status: AC | PRN

## 2018-07-21 MED ORDER — MIDAZOLAM HCL 2 MG/2ML IJ SOLN
INTRAMUSCULAR | Status: DC | PRN
  Administered 2018-07-21: 1 mg via INTRAVENOUS

## 2018-07-21 MED ORDER — PYRIDOSTIGMINE BROMIDE 60 MG PO TABS
60.0000 mg | ORAL_TABLET | Freq: Two times a day (BID) | ORAL | Status: DC
  Administered 2018-07-22 – 2018-07-23 (×4): 60 mg via ORAL
  Filled 2018-07-21 (×7): qty 1

## 2018-07-21 MED ORDER — MIDAZOLAM HCL 2 MG/2ML IJ SOLN
INTRAMUSCULAR | Status: AC
  Filled 2018-07-21: qty 2

## 2018-07-21 MED ORDER — HEPARIN (PORCINE) IN NACL 1000-0.9 UT/500ML-% IV SOLN
INTRAVENOUS | Status: DC | PRN
  Administered 2018-07-21: 500 mL

## 2018-07-21 MED ORDER — HEPARIN SODIUM (PORCINE) 1000 UNIT/ML IJ SOLN
INTRAMUSCULAR | Status: DC | PRN
  Administered 2018-07-21: 3000 [IU] via INTRAVENOUS
  Administered 2018-07-21: 9000 [IU] via INTRAVENOUS

## 2018-07-21 SURGICAL SUPPLY — 19 items
BALLN SAPPHIRE 2.5X15 (BALLOONS) ×2
BALLN SAPPHIRE ~~LOC~~ 3.25X15 (BALLOONS) ×2 IMPLANT
BALLOON SAPPHIRE 2.5X15 (BALLOONS) ×1 IMPLANT
CATH INFINITI 5 FR JL3.5 (CATHETERS) ×2 IMPLANT
CATH LAUNCHER 6FR JR4 (CATHETERS) ×2 IMPLANT
DEVICE RAD COMP TR BAND LRG (VASCULAR PRODUCTS) ×2 IMPLANT
ELECT DEFIB PAD ADLT CADENCE (PAD) ×2 IMPLANT
GLIDESHEATH SLEND SS 6F .021 (SHEATH) ×2 IMPLANT
GUIDEWIRE INQWIRE 1.5J.035X260 (WIRE) ×1 IMPLANT
INQWIRE 1.5J .035X260CM (WIRE) ×2
KIT ENCORE 26 ADVANTAGE (KITS) ×2 IMPLANT
KIT HEART LEFT (KITS) ×2 IMPLANT
KIT HEMO VALVE WATCHDOG (MISCELLANEOUS) ×4 IMPLANT
PACK CARDIAC CATHETERIZATION (CUSTOM PROCEDURE TRAY) ×2 IMPLANT
STENT SYNERGY DES 2.75X32 (Permanent Stent) ×2 IMPLANT
TRANSDUCER W/STOPCOCK (MISCELLANEOUS) ×2 IMPLANT
TUBING CIL FLEX 10 FLL-RA (TUBING) ×2 IMPLANT
WIRE ASAHI PROWATER 180CM (WIRE) ×2 IMPLANT
WIRE HI TORQ BMW 190CM (WIRE) ×2 IMPLANT

## 2018-07-21 NOTE — H&P (Signed)
Cardiology History and Physical Note   PCP:  [facility]  PCP-Cardiology: No primary care provider on file.     Reason for Admission: STEMI   HPI:   73 yo man with myasthenia gravis, HTN brought from [place] for inferior STEMI. He reports dyspnea for about 1 week.  Then he developed acute chest tightness in the center of his chest earlier this evening with radiation to the left arm with discomfort.  He denies having pain like this before.  He denies any history of cardiac problems.  No history of MI or coronary disease.  He had mild relief with nitroglycerin.  Of note he also reports a history of having a relatively low heart rate but denies any lightheadedness or syncope associated with it.  He does not have a significant history of early atherosclerosis in his family.  His father had a heart attack but that was in his 59s.  Denies smoking.  Main cardiovascular risk factors appear to be hypertension, age, and elevated BMI.  Review of Systems: [y] = yes,  = no   General: Weight gain ; Weight loss ; Anorexia ; Fatigue ; Fever ; Chills ; Weakness   Cardiac: Chest pain/pressure Cove.Etienne ]; Resting SOB Cove.Etienne ]; Exertional SOB [ y]; Orthopnea ; Pedal Edema ; Palpitations ; Syncope ; Presyncope ; Paroxysmal nocturnal dyspnea[ ]   Pulmonary: Cough ; Wheezing[ ] ; Hemoptysis[ ] ; Sputum ; Snoring   GI: Vomiting[ ] ; Dysphagia[ ] ; Melena[ ] ; Hematochezia ; Heartburn[ ] ; Abdominal pain ; Constipation ; Diarrhea ; BRBPR   GU: Hematuria[ ] ; Dysuria ; Nocturia[ ]   Vascular: Pain in legs with walking ; Pain in feet with lying flat ; Non-healing sores ; Stroke ; TIA ; Slurred speech ;  Neuro: Headaches[ ] ; Vertigo[ ] ; Seizures[ ] ; Paresthesias[ ] ;Blurred vision ; Diplopia ; Vision changes   Ortho/Skin: Arthritis ; Joint pain ; Muscle pain ; Joint swelling ; Back Pain ; Rash   Psych: Depression[ ] ;  Anxiety[ ]   Heme: Bleeding problems ; Clotting disorders ; Anemia   Endocrine: Diabetes ; Thyroid dysfunction[ ]    Home Medications Prior to Admission medications   Medication Sig Start Date End Date Taking? Authorizing Provider  Calcium Carbonate (CALCIUM 600 PO) Take by mouth.    [provider]  Ferrous Sulfate (SLOW FE PO) Take by mouth.    [provider]  omeprazole (PRILOSEC) 20 MG capsule Take 1 capsule (20 mg total) by mouth daily. 08/15/17   Patel, Roxana Hires K, DO  predniSONE (DELTASONE) 5 MG tablet Take  (2 tab) for 2 weeks, then reduce to  alternating with 7.5mg  (1.5 tab) x 2 weeks, then stay on 7.5mg  daily. Patient taking differently: Take 7.5 mg by mouth daily with breakfast.  09/13/17   Nita Sickle K, DO  pyridostigmine (MESTINON) 60 MG tablet Take 1 tablet (60 mg total) by mouth 3 (three) times daily. Take 1 tablet at 7am, 11am, 3pm, and 7pm. Patient taking differently: Take 60 mg by mouth 2 (two) times daily. Take 1 tablet at 7am, 11am, 3pm, and 7pm. 12/20/17   Nita Sickle K, DO  ramipril (ALTACE) 5 MG capsule  02/17/17   [provider]    Past Medical History: Past Medical History:  Diagnosis Date  Dysphagia    Kidney stones    TIA (transient ischemic attack)     Past Surgical History: Past Surgical History:  Procedure Laterality Date   BACK SURGERY     LITHOTRIPSY      Family History:  Family History  Problem Relation Age of Onset   Heart attack Mother    Heart attack Father    Diabetes Sister    Heart attack Sister    Esophageal cancer Brother     Social History: Social History   Socioeconomic History   Marital status: Married    Spouse name: Not on file   Number of children: 3   Years of education: 14   Highest education level: Not on file  Occupational History   Occupation: retired    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  Social Network engineer strain: Not on file   Food  insecurity:    Worry: Not on file    Inability: Not on file   Transportation needs:    Medical: Not on file    Non-medical: Not on file  Tobacco Use   Smoking status: Never Smoker   Smokeless tobacco: Former Engineer, water and Sexual Activity   Alcohol use: No    Frequency: Never   Drug use: No   Sexual activity: Not on file  Lifestyle   Physical activity:    Days per week: Not on file    Minutes per session: Not on file   Stress: Not on file  Relationships   Social connections:    Talks on phone: Not on file    Gets together: Not on file    Attends religious service: Not on file    Active member of club or organization: Not on file    Attends meetings of clubs or organizations: Not on file    Relationship status: Not on file  Other Topics Concern   Not on file  Social History Narrative   Lives with wife in a one story home.  Has 3 children.     Retired Public librarian.     Education: some college.     Allergies:  Allergies  Allergen Reactions   Demerol [Meperidine Hcl]     Objective:    Vital Signs:       Vitals:   07/22/18 0130 07/22/18 0200 07/22/18 0300 07/22/18 0400  BP: 110/60 (!) 113/58 (!) 137/56 123/61  Pulse: (!) 36 (!) 44 65 (!) 44  Resp: 16 19 (!) 26 (!) 21  Temp:   98.3 F (36.8 C)   TempSrc:   Oral   SpO2: 100% 98% 98% 98%  Weight:      Height:         Physical Exam     General:  Well appearing. No respiratory difficulty HEENT: Normal Neck: Supple. no JVD. Carotids 2+ bilat; no bruits. No lymphadenopathy or thyromegaly appreciated. Cor: PMI nondisplaced.  Bradycardic, regular rhythm. No rubs, gallops or murmurs. Lungs: Clear Abdomen: Soft, nontender, nondistended. No hepatosplenomegaly. No bruits or masses. Good bowel sounds. Extremities: No cyanosis, clubbing, rash, edema.  Right radial pulse intact.  No hematoma right wrist.  Normal left radial pulse and normal bilateral pedal pulses. Neuro: Alert  & oriented x 3, cranial nerves grossly intact. moves all 4 extremities w/o difficulty. Affect pleasant.   Telemetry   Sinus bradycardia  Labs     Basic Metabolic Panel: No results for input(s): NA, K, CL, CO2, GLUCOSE, BUN, CREATININE, CALCIUM, MG, PHOS in  the last 168 hours.  Liver Function Tests: No results for input(s): AST, ALT, ALKPHOS, BILITOT, PROT, ALBUMIN in the last 168 hours. No results for input(s): LIPASE, AMYLASE in the last 168 hours. No results for input(s): AMMONIA in the last 168 hours.  CBC: No results for input(s): WBC, NEUTROABS, HGB, HCT, MCV, PLT in the last 168 hours.  Cardiac Enzymes: No results for input(s): CKTOTAL, CKMB, CKMBINDEX, TROPONINI in the last 168 hours.  BNP: BNP (last 3 results) No results for input(s): BNP in the last 8760 hours.  ProBNP (last 3 results) No results for input(s): PROBNP in the last 8760 hours.   CBG: No results for input(s): GLUCAP in the last 168 hours.  Coagulation Studies: No results for input(s): LABPROT, INR in the last 72 hours.  Imaging:  No results found.   Patient Profile    Assessment/Plan   73 yo man with inferior STEMI, history of myasthenia gravis and hypertension Brought emergently to cath lab for coronary angiography and PCI. He underwent PCI with placement of drug-eluting stent to the RCA. Aspirin and ticagrelor for DAPT. High intensity statin. Hold beta-blocker in the setting of bradycardia with heart rate in the 40s.  He is asymptomatic in terms of bradycardia. Continue home prednisone and pyridostigmine for myasthenia gravis. Check echo.  Continue telemetry.   Stark Bray, MD 07/21/2018, 9:59 PM  Pager [phone] (M-F; 7a - 4p)  Please contact [facility] for night-coverage after hours (4p -7a ) and weekends on [email or web address]

## 2018-07-21 NOTE — ED Notes (Signed)
Cath lab ready on pt arrival.  COVID specimen obtained and pt straight to CATH LAB by [facility].

## 2018-07-22 ENCOUNTER — Inpatient Hospital Stay (HOSPITAL_COMMUNITY): Payer: Medicare HMO

## 2018-07-22 DIAGNOSIS — I2119 ST elevation (STEMI) myocardial infarction involving other coronary artery of inferior wall: Principal | ICD-10-CM

## 2018-07-22 DIAGNOSIS — I340 Nonrheumatic mitral (valve) insufficiency: Secondary | ICD-10-CM

## 2018-07-22 LAB — COMPREHENSIVE METABOLIC PANEL
ALT: 15 U/L (ref 0–44)
AST: 24 U/L (ref 15–41)
Albumin: 3.7 g/dL (ref 3.5–5.0)
Alkaline Phosphatase: 73 U/L (ref 38–126)
Anion gap: 9 (ref 5–15)
BUN: 11 mg/dL (ref 8–23)
CO2: 24 mmol/L (ref 22–32)
Calcium: 8.7 mg/dL — ABNORMAL LOW (ref 8.9–10.3)
Chloride: 106 mmol/L (ref 98–111)
Creatinine, Ser: 1.17 mg/dL (ref 0.61–1.24)
GFR calc Af Amer: >60 mL/min (ref >60)
GFR calc non Af Amer: >60 mL/min (ref >60)
Glucose, Bld: 203 mg/dL — ABNORMAL HIGH (ref 70–99)
Potassium: 3.6 mmol/L (ref 3.5–5.1)
Sodium: 139 mmol/L (ref 135–145)
Total Bilirubin: 0.5 mg/dL (ref 0.3–1.2)
Total Protein: 5.9 g/dL — ABNORMAL LOW (ref 6.5–8.1)

## 2018-07-22 LAB — ECHOCARDIOGRAM COMPLETE
Height: 65 in
Weight: 2744.29 oz

## 2018-07-22 LAB — CBC
HCT: 36.9 % — ABNORMAL LOW (ref 39.0–52.0)
Hemoglobin: 12.4 g/dL — ABNORMAL LOW (ref 13.0–17.0)
MCH: 31.3 pg (ref 26.0–34.0)
MCHC: 33.6 g/dL (ref 30.0–36.0)
MCV: 93.2 fL (ref 80.0–100.0)
Platelets: 190 10*3/uL (ref 150–400)
RBC: 3.96 MIL/uL — ABNORMAL LOW (ref 4.22–5.81)
RDW: 12.7 % (ref 11.5–15.5)
WBC: 9.1 10*3/uL (ref 4.0–10.5)
nRBC: 0 % (ref 0.0–0.2)

## 2018-07-22 LAB — TROPONIN I
Troponin I: 0.15 ng/mL (ref <0.03)
Troponin I: 17.37 ng/mL (ref <0.03)
Troponin I: 16.2 ng/mL (ref <0.03)

## 2018-07-22 LAB — BASIC METABOLIC PANEL
Anion gap: 9 (ref 5–15)
BUN: 11 mg/dL (ref 8–23)
CO2: 26 mmol/L (ref 22–32)
Calcium: 8.8 mg/dL — ABNORMAL LOW (ref 8.9–10.3)
Chloride: 106 mmol/L (ref 98–111)
Creatinine, Ser: 1.11 mg/dL (ref 0.61–1.24)
GFR calc Af Amer: >60 mL/min (ref >60)
GFR calc non Af Amer: >60 mL/min (ref >60)
Glucose, Bld: 87 mg/dL (ref 70–99)
Potassium: 4.4 mmol/L (ref 3.5–5.1)
Sodium: 141 mmol/L (ref 135–145)

## 2018-07-22 LAB — MRSA PCR SCREENING: MRSA by PCR: NEGATIVE

## 2018-07-22 NOTE — Progress Notes (Addendum)
Pt home meds inventoried and sealed in security bag and taken to Pharmacy. Pt's receipt placed in chart,  Pt notified of securing of home meds

## 2018-07-22 NOTE — Progress Notes (Signed)
  Echocardiogram 2D Echocardiogram has been performed.  Adrian Bell 07/22/2018, 1:27 PM

## 2018-07-22 NOTE — Progress Notes (Signed)
Pt received from cath lab s/p PCI, full report received. Py attached to unit monitor and MRSA swab and CHG 6 cloth completed. VSS with continue and monitor.  COVID results (-), Pt face mask D/C'd

## 2018-07-22 NOTE — Progress Notes (Signed)
Progress Note  Patient Name: Adrian Bell Date of Encounter: 07/22/2018  Primary Cardiologist: No primary care provider on file.   Subjective   "I feel better." Notes a little sob while talking.  Inpatient Medications    Scheduled Meds: . aspirin  81 mg Oral Daily  . atorvastatin  80 mg Oral q1800  . metoprolol tartrate  12.5 mg Oral BID  . predniSONE  7.5 mg Oral Q breakfast  . pyridostigmine  60 mg Oral BID  . sodium chloride flush  3 mL Intravenous Q12H  . ticagrelor  90 mg Oral BID   Continuous Infusions: . sodium chloride     PRN Meds: sodium chloride, acetaminophen, ondansetron (ZOFRAN) IV, sodium chloride flush   Vital Signs    Vitals:   07/22/18 0600 07/22/18 0700 07/22/18 0800 07/22/18 0817  BP: 128/65 124/67 129/62   Pulse: (!) 44 (!) 42 (!) 50   Resp: 17 13 19    Temp:    97.9 F (36.6 C)  TempSrc:    Oral  SpO2: 98% 98% 98%   Weight:      Height:        Intake/Output Summary (Last 24 hours) at 07/22/2018 1155 Last data filed at 07/22/2018 0500 Gross per 24 hour  Intake 860.81 ml  Output 225 ml  Net 635.81 ml   Filed Weights   07/21/18 2330  Weight: 77.8 kg    Telemetry    nsr with sinus brady and transient AV block - Personally Reviewed  ECG    NSR with sinus brady and marked reduction in ST elevation, minimal Q waves - Personally Reviewed  Physical Exam   GEN: No acute distress.   Neck: No JVD Cardiac: Reg Huston Foley, no murmurs, rubs, or gallops.  Respiratory: no increased work of breathing GI: Soft, nontender, non-distended  MS: No edema; No deformity. Neuro:  Nonfocal  Psych: Normal affect   Labs    Chemistry Recent Labs  Lab 07/21/18 2254 07/22/18 0554  NA 139 141  K 3.6 4.4  CL 106 106  CO2 24 26  GLUCOSE 203* 87  BUN 11 11  CREATININE 1.17 1.11  CALCIUM 8.7* 8.8*  PROT 5.9*  --   ALBUMIN 3.7  --   AST 24  --   ALT 15  --   ALKPHOS 73  --   BILITOT 0.5  --   GFRNONAA >60 >60  GFRAA >60 >60  ANIONGAP 9 9      Hematology Recent Labs  Lab 07/21/18 2254 07/22/18 0554  WBC 8.8 9.1  RBC 3.82* 3.96*  HGB 12.0* 12.4*  HCT 35.2* 36.9*  MCV 92.1 93.2  MCH 31.4 31.3  MCHC 34.1 33.6  RDW 12.6 12.7  PLT 220 190    Cardiac Enzymes Recent Labs  Lab 07/21/18 2254 07/22/18 0554  TROPONINI 0.15* 17.37*   No results for input(s): TROPIPOC in the last 168 hours.   BNPNo results for input(s): BNP, PROBNP in the last 168 hours.   DDimer No results for input(s): DDIMER in the last 168 hours.   Radiology    No results found.  Cardiac Studies   Heart cath/PCI noted  Patient Profile     73 y.o. male admitted with inferior STEMI with transient heart block  Assessment & Plan    1. Inferior MI - he is pain free and doing well.  2. Heart block - this appears to have resolved.  3. Myasthenia - on pyridostigmine 4. Disp. - we will  keep in the ICU today and plan to transfer to to tele tomorrow. DC on Monday if stable.  For questions or updates, please contact [facility] Please consult [email or web address] for contact info under Cardiology/STEMI.   Signed, Lewayne Bunting, MD  07/22/2018, 11:55 AM  Patient ID: Adrian Bell, male   DOB: Nov 17, 1945, 73 y.o.   MRN: [id number]

## 2018-07-22 NOTE — Progress Notes (Signed)
CARDIAC REHAB PHASE I   PRE:  Rate/Rhythm: 43 SB    BP: sitting 153/68    SaO2: 99 RA  MODE:  Ambulation: 370 ft   POST:  Rate/Rhythm: 60 SR    BP: sitting 155/62     SaO2: 97 RA  Pt sitting in recliner, feeling well. He did endorse SOB with talking on phone. He also sts he has had a very slight residual throat soreness.  Able to walk without c/o.  Throat soreness did not increase, no SOB. To BR then return to recliner. Discussed MI, stent, Brilinta/meds, diet, exercising on his own, NTG (not sure candidate due to HR), and CRPII. Pt very receptive, asked appropriate questions. Understands the importance of Brilinta. He has had severe sx with a statin in the past. Only tried one. Pt likes to stay active. Will refer to [place] [facility] however I am unsure if they have a virtual component right now.  [phone]  Harriet Masson CES, ACSM 07/22/2018 10:48 AM

## 2018-07-22 NOTE — Procedures (Signed)
Echo attempted. Patient education in progress. Will attempt again later.

## 2018-07-23 ENCOUNTER — Encounter (HOSPITAL_COMMUNITY): Payer: Self-pay

## 2018-07-23 ENCOUNTER — Other Ambulatory Visit: Payer: Self-pay

## 2018-07-23 MED ORDER — CHLORHEXIDINE GLUCONATE CLOTH 2 % EX PADS
6.0000 | MEDICATED_PAD | Freq: Every day | CUTANEOUS | Status: DC
  Administered 2018-07-23: 6 via TOPICAL

## 2018-07-23 MED ORDER — ROSUVASTATIN CALCIUM 20 MG PO TABS
20.0000 mg | ORAL_TABLET | Freq: Every day | ORAL | Status: DC
  Administered 2018-07-23: 20 mg via ORAL
  Filled 2018-07-23: qty 1

## 2018-07-23 MED ORDER — METOPROLOL SUCCINATE ER 25 MG PO TB24
12.5000 mg | ORAL_TABLET | Freq: Every day | ORAL | Status: DC
  Administered 2018-07-24: 11:00:00 12.5 mg via ORAL
  Filled 2018-07-23 (×2): qty 1

## 2018-07-23 NOTE — Progress Notes (Signed)
Report given to [facility] for 6E08.  Transported via wheelchair s/p ambulation in room.  Currently without chestpain or SOB.  Awake, alert and oriented at time of transfer.

## 2018-07-23 NOTE — Progress Notes (Addendum)
Progress Note  Patient Name: Adrian Bell Date of Encounter: 07/23/2018  Primary Cardiologist: No primary care provider on file.   Subjective   "my legs hurt on that statin medication."  Inpatient Medications    Scheduled Meds: . aspirin  81 mg Oral Daily  . Chlorhexidine Gluconate Cloth  6 each Topical Daily  . metoprolol succinate  12.5 mg Oral Daily  . predniSONE  7.5 mg Oral Q breakfast  . pyridostigmine  60 mg Oral BID  . rosuvastatin  20 mg Oral q1800  . sodium chloride flush  3 mL Intravenous Q12H  . ticagrelor  90 mg Oral BID   Continuous Infusions: . sodium chloride     PRN Meds: sodium chloride, acetaminophen, ondansetron (ZOFRAN) IV, sodium chloride flush   Vital Signs    Vitals:   07/23/18 0400 07/23/18 0500 07/23/18 0600 07/23/18 0751  BP: 130/63 (!) 112/56 123/68 128/65  Pulse: (!) 44 (!) 42 (!) 44 (!) 58  Resp: 11 14 12  (!) 21  Temp:    98.1 F (36.7 C)  TempSrc:    Oral  SpO2: 100% 100% 100%   Weight:  77.7 kg    Height:        Intake/Output Summary (Last 24 hours) at 07/23/2018 1027 Last data filed at 07/22/2018 1600 Gross per 24 hour  Intake -  Output 50 ml  Net -50 ml   Filed Weights   07/21/18 2330 07/23/18 0500  Weight: 77.8 kg 77.7 kg    Telemetry    Sinus brady/NSR - Personally Reviewed  ECG    Sinus brady with inferior MI - Personally Reviewed  Physical Exam   GEN: No acute distress.   Neck: No JVD Cardiac: Reg brady, no murmurs, rubs, or gallops.  Respiratory: Clear to auscultation bilaterally. GI: Soft, nontender, non-distended  MS: No edema; No deformity. Neuro:  Nonfocal  Psych: Normal affect   Labs    Chemistry Recent Labs  Lab 07/21/18 2254 07/22/18 0554  NA 139 141  K 3.6 4.4  CL 106 106  CO2 24 26  GLUCOSE 203* 87  BUN 11 11  CREATININE 1.17 1.11  CALCIUM 8.7* 8.8*  PROT 5.9*  --   ALBUMIN 3.7  --   AST 24  --   ALT 15  --   ALKPHOS 73  --   BILITOT 0.5  --   GFRNONAA >60 >60  GFRAA >60 >60   ANIONGAP 9 9     Hematology Recent Labs  Lab 07/21/18 2254 07/22/18 0554  WBC 8.8 9.1  RBC 3.82* 3.96*  HGB 12.0* 12.4*  HCT 35.2* 36.9*  MCV 92.1 93.2  MCH 31.4 31.3  MCHC 34.1 33.6  RDW 12.6 12.7  PLT 220 190    Cardiac Enzymes Recent Labs  Lab 07/21/18 2254 07/22/18 0554 07/22/18 1246  TROPONINI 0.15* 17.37* 16.20*   No results for input(s): TROPIPOC in the last 168 hours.   BNPNo results for input(s): BNP, PROBNP in the last 168 hours.   DDimer No results for input(s): DDIMER in the last 168 hours.   Radiology    No results found.  Cardiac Studies   2D echo - EF 40%  Patient Profile     73 y.o. male admitted with STEMI, s/p PCI  Assessment & Plan    1. STEMI - he is pain free. He has had no complication so far. He will be transferred to tele. 2. Sinus node dysfunction - we will switch to longer  acting low dose toprol. 3. Statin intolerance. - I will switch him to a lower dose of crestor as he was having muscle aches on lipitor. He notes problems taking statins in the past.  4. Disp. - I would anticipate that he be able to be discharged home tomorrow.  For questions or updates, please contact [facility] Please consult [email or web address] for contact info under Cardiology/STEMI.   Signed, Adrian Bunting, MD  07/23/2018, 10:27 AM  Patient ID: Adrian Bell, male   DOB: 1946/01/07, 73 y.o.   MRN: [id number]

## 2018-07-24 ENCOUNTER — Encounter (HOSPITAL_COMMUNITY): Payer: Self-pay | Admitting: Interventional Cardiology

## 2018-07-24 DIAGNOSIS — E785 Hyperlipidemia, unspecified: Secondary | ICD-10-CM

## 2018-07-24 DIAGNOSIS — I10 Essential (primary) hypertension: Secondary | ICD-10-CM

## 2018-07-24 HISTORY — DX: Essential (primary) hypertension: I10

## 2018-07-24 LAB — POCT I-STAT CREATININE: Creatinine, Ser: 1 mg/dL (ref 0.61–1.24)

## 2018-07-24 LAB — POCT ACTIVATED CLOTTING TIME
Activated Clotting Time: 257 seconds
Activated Clotting Time: 307 seconds

## 2018-07-24 MED ORDER — ROSUVASTATIN CALCIUM 20 MG PO TABS: 20.0000 mg | ORAL_TABLET | Freq: Every day | ORAL | 1 refills | Status: DC

## 2018-07-24 MED ORDER — ASPIRIN 81 MG PO CHEW: 81.0000 mg | CHEWABLE_TABLET | Freq: Every day | ORAL | 1 refills | Status: DC

## 2018-07-24 MED ORDER — METOPROLOL SUCCINATE ER 25 MG PO TB24: 12.5000 mg | ORAL_TABLET | Freq: Every day | ORAL | 0 refills | Status: DC

## 2018-07-24 MED ORDER — TICAGRELOR 90 MG PO TABS: 90.0000 mg | ORAL_TABLET | Freq: Two times a day (BID) | ORAL | 1 refills | Status: DC

## 2018-07-24 MED ORDER — NITROGLYCERIN 0.4 MG SL SUBL: 0.4000 mg | SUBLINGUAL_TABLET | SUBLINGUAL | 2 refills | Status: DC | PRN

## 2018-07-24 MED FILL — ASPIRIN LOW DOSE 81 MG CHEW: 81 | 90 days supply | Qty: 90 | Fill #0

## 2018-07-24 MED FILL — NITROGLYCERIN 0.4 MG TAB SL: 0.4 | 8 days supply | Qty: 25 | Fill #0

## 2018-07-24 MED FILL — ROSUVASTATIN CALCIUM 20 MG: 20 | 90 days supply | Qty: 90 | Fill #0

## 2018-07-24 MED FILL — BRILINTA 90 MG TABLET: 90 | 30 days supply | Qty: 60 | Fill #0

## 2018-07-24 MED FILL — METOPROLOL SUCCINATE ER 25: 25 | 45 days supply | Qty: 45 | Fill #0

## 2018-07-24 NOTE — Discharge Summary (Signed)
Discharge Summary    Patient ID: Adrian Bell,  MRN: [id number], DOB/AGE: 1946-01-24 73 y.o.  Admit date: 07/21/2018 Discharge date: 07/24/2018  Primary Care Provider: [facility] Primary Cardiologist: Dr. Eldridge Dace   Discharge Diagnoses    Active Problems:   Acute MI, inferior wall (HCC)   Hyperlipidemia   Hypertension   Allergies Allergies  Allergen Reactions  . Demerol [Meperidine Hcl]     Diagnostic Studies/Procedures    Cath: 07/21/18   Ost RCA to Prox RCA lesion is 50% stenosed.  Mid RCA lesion is 100% stenosed.  A drug-eluting stent was successfully placed using a STENT SYNERGY DES 2.75X32, postdilated 3.25 mm.  Post intervention, there is a 0% residual stenosis.  Ost 1st Mrg lesion is 50% stenosed.  Ost Cx to Mid Cx lesion is 25% stenosed.  Ost LAD to Prox LAD lesion is 50% stenosed.  Dist LAD lesion is 75% stenosed.  The left ventricular ejection fraction is 35-45% by visual estimate.  There is mild to moderate left ventricular systolic dysfunction.  LV end diastolic pressure is normal. LVEDP 10 mm Hg.  There is no aortic valve stenosis.   Diffusely diseased coronary arteries.  Culprit vessel was RCA and this was successfully treated.   Would plan medical therapy for the rest of his CAD given that he was asymptomatic prior to this acute event.    If he has further angina, could consider PCI of the LAD.   Diagnostic  Dominance: Right    Intervention     TTE: 07/22/18  IMPRESSIONS    1. The left ventricle has normal systolic function, with an ejection fraction of 55-60%. The cavity size was normal. There is mild concentric left ventricular hypertrophy. Left ventricular diastolic parameters were normal. No evidence of left  ventricular regional wall motion abnormalities.  2. The right ventricle has normal systolic function. The cavity was normal. There is no increase in right ventricular wall thickness.  3. The mitral valve is  grossly normal. There is mild mitral annular calcification present.  4. The tricuspid valve is grossly normal.  5. The aortic valve is tricuspid. _____________   History of Present Illness     73 yo man with myasthenia gravis, HTN brought from [facility] for inferior STEMI. He reported dyspnea for about 1 week. Then he developed acute chest tightness in the center of his chest earlier the evening of admission with radiation to the left arm with discomfort.  He denied having pain like this before.  He denied any history of cardiac problems.  No history of MI or coronary disease.  He had mild relief with nitroglycerin.  Of note he also reported a history of having a relatively low heart rate but denied any lightheadedness or syncope associated with it.  He did not have a significant history of early atherosclerosis in his family.  His father had a heart attack but that was in his 54s.  Denied smoking.  Main cardiovascular risk factors appear to be hypertension, age, and elevated BMI. EKG showed acute inferior ST segment elevation. He was brought to the cath lab for emergent cath.   Hospital Course     Underwent cardiac cath noted above with diffusely diseased coronary arteries, but culprit lesion felt to be RCA vessel. Underwent PCI/DESx1 to the mRCA. Plan for DAPT with ASA/Brilinta for at least one year. Also noted to have residual disease in the distal LAD, but plans to treat medical unless recurrent angina. Troponin peaked at  17.37. Initially was noted placed on BB 2/2 to bradycardia. His heart rate did improve and was able to be placed on low dose Toprol XL 12.5mg  daily. Also placed on high dose Crestor but c/o leg pain, therefore decreased to 20mg  daily. LDL noted at 96. Hgb A1c 5.5. Follow up echo showed EF of 55-60% with mild LVH. He was seen by cardiac rehab and ambulated without difficulty. Home ACEi was restarted at the time of discharge.    [facility] was seen by Dr. Clifton James and determined  stable for discharge home. Follow up in the office has been arranged. Medications are listed below.   _____________  Discharge Vitals Blood pressure 129/74, pulse 65, temperature 98 F (36.7 C), temperature source Oral, resp. rate 14, height 5\' 5"  (1.651 m), weight 76 kg, SpO2 99 %.  Filed Weights   07/21/18 2330 07/23/18 0500 07/24/18 0527  Weight: 77.8 kg 77.7 kg 76 kg    Labs & Radiologic Studies    CBC Recent Labs    07/21/18 2254 07/22/18 0554  WBC 8.8 9.1  HGB 12.0* 12.4*  HCT 35.2* 36.9*  MCV 92.1 93.2  PLT 220 190   Basic Metabolic Panel Recent Labs    91/91/66 2254 07/22/18 0554  NA 139 141  K 3.6 4.4  CL 106 106  CO2 24 26  GLUCOSE 203* 87  BUN 11 11  CREATININE 1.17 1.11  CALCIUM 8.7* 8.8*   Liver Function Tests Recent Labs    07/21/18 2254  AST 24  ALT 15  ALKPHOS 73  BILITOT 0.5  PROT 5.9*  ALBUMIN 3.7   No results for input(s): LIPASE, AMYLASE in the last 72 hours. Cardiac Enzymes Recent Labs    07/21/18 2254 07/22/18 0554 07/22/18 1246  TROPONINI 0.15* 17.37* 16.20*   BNP Invalid input(s): POCBNP D-Dimer No results for input(s): DDIMER in the last 72 hours. Hemoglobin A1C Recent Labs    07/21/18 2254  HGBA1C 5.5   Fasting Lipid Panel Recent Labs    07/21/18 2254  CHOL 180  HDL 59  LDLCALC 96  TRIG 127  CHOLHDL 3.1   Thyroid Function Tests No results for input(s): TSH, T4TOTAL, T3FREE, THYROIDAB in the last 72 hours.  Invalid input(s): FREET3 _____________  No results found. Disposition   Pt is being discharged home today in good condition.  Follow-up Plans & Appointments    Follow-up Information    Baldo Daub, MD Follow up on 08/02/2018.   Specialty:  Cardiology Why:  at 1:35pm for your follow up appt. This will be an in office visit. This is scheduled at the [facility] office as he is only currently scheduled there for the next several weeks.  Contact information: [address] [place]  [place] [address] [phone]          Discharge Instructions    Amb Referral to Cardiac Rehabilitation   Complete by:  As directed    To [facility] [place]   Diagnosis:   Coronary Stents STEMI PTCA     After initial evaluation and assessments completed: Virtual Based Care may be provided alone or in conjunction with Phase 2 Cardiac Rehab based on patient barriers.:  Yes   Call MD for:  redness, tenderness, or signs of infection (pain, swelling, redness, odor or green/yellow discharge around incision site)   Complete by:  As directed    Diet - low sodium heart healthy   Complete by:  As directed    Discharge instructions  Complete by:  As directed    Radial Site Care Refer to this sheet in the next few weeks. These instructions provide you with information on caring for yourself after your procedure. Your caregiver may also give you more specific instructions. Your treatment has been planned according to current medical practices, but problems sometimes occur. Call your caregiver if you have any problems or questions after your procedure. HOME CARE INSTRUCTIONS You may shower the day after the procedure.Remove the bandage (dressing) and gently wash the site with plain soap and water.Gently pat the site dry.  Do not apply powder or lotion to the site.  Do not submerge the affected site in water for 3 to 5 days.  Inspect the site at least twice daily.  Do not flex or bend the affected arm for 24 hours.  No lifting over 5 pounds (2.3 kg) for 5 days after your procedure.  Do not drive home if you are discharged the same day of the procedure. Have someone else drive you.  You may drive 24 hours after the procedure unless otherwise instructed by your caregiver.  What to expect: Any bruising will usually fade within 1 to 2 weeks.  Blood that collects in the tissue (hematoma) may be painful to the touch. It should usually decrease in size and tenderness within 1 to 2 weeks.  SEEK IMMEDIATE MEDICAL CARE  IF: You have unusual pain at the radial site.  You have redness, warmth, swelling, or pain at the radial site.  You have drainage (other than a small amount of blood on the dressing).  You have chills.  You have a fever or persistent symptoms for more than 72 hours.  You have a fever and your symptoms suddenly get worse.  Your arm becomes pale, cool, tingly, or numb.  You have heavy bleeding from the site. Hold pressure on the site.   PLEASE DO NOT MISS ANY DOSES OF YOUR BRILINTA!!!!! Also keep a log of you blood pressures and bring back to your follow up appt. Please call the office with any questions.   Patients taking blood thinners should generally stay away from medicines like ibuprofen, Advil, Motrin, naproxen, and Aleve due to risk of stomach bleeding. You may take Tylenol as directed or talk to your primary doctor about alternatives.   Increase activity slowly   Complete by:  As directed        Discharge Medications     Medication List    TAKE these medications   aspirin 81 MG chewable tablet Chew 1 tablet (81 mg total) by mouth daily.   metoprolol succinate 25 MG 24 hr tablet Commonly known as:  TOPROL-XL Take 0.5 tablets (12.5 mg total) by mouth daily.   nitroGLYCERIN 0.4 MG SL tablet Commonly known as:  Nitrostat Place 1 tablet (0.4 mg total) under the tongue every 5 (five) minutes as needed.   predniSONE 5 MG tablet Commonly known as:  DELTASONE Take 10mg  (2 tab) for 2 weeks, then reduce to 10mg  alternating with 7.5mg  (1.5 tab) x 2 weeks, then stay on 7.5mg  daily. What changed:    how much to take  how to take this  when to take this  additional instructions   pyridostigmine 60 MG tablet Commonly known as:  MESTINON Take 1 tablet (60 mg total) by mouth 3 (three) times daily. Take 1 tablet at 7am, 11am, 3pm, and 7pm. What changed:    when to take this  additional instructions   ramipril 5 MG  capsule Commonly known as:  ALTACE Take 5 mg by mouth  daily.   rosuvastatin 20 MG tablet Commonly known as:  CRESTOR Take 1 tablet (20 mg total) by mouth daily at 6 PM.   ticagrelor 90 MG Tabs tablet Commonly known as:  BRILINTA Take 1 tablet (90 mg total) by mouth 2 (two) times daily.        Acute coronary syndrome (MI, NSTEMI, STEMI, etc) this admission?: Yes.     AHA/ACC Clinical Performance & Quality Measures: 1. Aspirin prescribed? - Yes 2. ADP Receptor Inhibitor (Plavix/Clopidogrel, Brilinta/Ticagrelor or Effient/Prasugrel) prescribed (includes medically managed patients)? - Yes 3. Beta Blocker prescribed? - Yes 4. High Intensity Statin (Lipitor 40-80mg  or Crestor 20-40mg ) prescribed? - Yes 5. EF assessed during THIS hospitalization? - Yes 6. For EF <40%, was ACEI/ARB prescribed? - Yes 7. For EF <40%, Aldosterone Antagonist (Spironolactone or Eplerenone) prescribed? - Not Applicable (EF >/= 40%) 8. Cardiac Rehab Phase II ordered (Included Medically managed Patients)? - Yes   Outstanding Labs/Studies   FLP/LFTs in 6 weeks.   Duration of Discharge Encounter   Greater than 30 minutes including physician time.  Signed, Laverda Page NP-C 07/24/2018, 10:50 AM   I have personally seen and examined this patient. I agree with the assessment and plan as outlined above.  He is doing well this am. No chest pain. BP stable. Sinus on tele. Echo with LVEF=55-60%.  My exam: General: Well developed, well nourished, NAD  HEENT: OP clear, mucus membranes moist  SKIN: warm, dry. No rashes. Neuro: No focal deficits  Musculoskeletal: Muscle strength 5/5 all ext  Psychiatric: Mood and affect normal  Neck: No JVD, no carotid bruits, no thyromegaly, no lymphadenopathy.  Lungs:Clear bilaterally, no wheezes, rhonci, crackles Cardiovascular: Regular rate and rhythm. No murmurs, gallops or rubs. Abdomen:Soft. Bowel sounds present. Non-tender.  Extremities: No lower extremity edema. Pulses are 2 + in the bilateral DP/PT.  Will plan medical  management of LAD lesion for now. Discharge home today on ASA/Brilinta/statin and beta blocker.   Verne Carrow 10:50 AM 07/24/2018

## 2018-07-24 NOTE — Progress Notes (Signed)
Pt has been walking independently. Sts he feels great. Reviewed ed briefly. No questions.  [id number] Ethelda Chick CES, ACSM 11:05 AM 07/24/2018

## 2018-07-25 LAB — POCT I-STAT 7, (LYTES, BLD GAS, ICA,H+H)
Acid-base deficit: 2 mmol/L (ref 0.0–2.0)
Bicarbonate: 22.9 mmol/L (ref 20.0–28.0)
Calcium, Ion: 1.21 mmol/L (ref 1.15–1.40)
HCT: 37 % — ABNORMAL LOW (ref 39.0–52.0)
Hemoglobin: 12.6 g/dL — ABNORMAL LOW (ref 13.0–17.0)
O2 Saturation: 99 %
Potassium: 3.6 mmol/L (ref 3.5–5.1)
Sodium: 141 mmol/L (ref 135–145)
TCO2: 24 mmol/L (ref 22–32)
pCO2 arterial: 38.1 mmHg (ref 32.0–48.0)
pH, Arterial: 7.388 (ref 7.350–7.450)
pO2, Arterial: 132 mmHg — ABNORMAL HIGH (ref 83.0–108.0)

## 2018-07-27 ENCOUNTER — Telehealth: Payer: Self-pay | Admitting: Cardiology

## 2018-07-27 ENCOUNTER — Other Ambulatory Visit: Payer: Self-pay

## 2018-07-27 MED ORDER — PITAVASTATIN CALCIUM 2 MG PO TABS: 2.0000 mg | ORAL_TABLET | Freq: Every day | ORAL | 0 refills | Status: DC

## 2018-07-27 MED ORDER — PITAVASTATIN CALCIUM 2 MG PO TABS: 2.0000 mg | ORAL_TABLET | Freq: Every day | ORAL | 1 refills | Status: DC

## 2018-07-27 NOTE — Addendum Note (Signed)
Addended by: Jacques Earthly A on: 07/27/2018 02:46 PM   Modules accepted: Orders

## 2018-07-27 NOTE — Telephone Encounter (Signed)
Yes Livalo 2 mg a day would wait 1 week to start

## 2018-07-27 NOTE — Telephone Encounter (Addendum)
Phoned patient, informed to stop Crestor and in 7 days begin taking livalo 2 mg daily. Patient would like 30 days rx sent to [facility] and 30 days to [facility] in [place] since it will probably be cheaper there but will also take longer to receive the first time.  Order sent electronically to [facility] and being sent by fax to [facility] per pt request.

## 2018-07-27 NOTE — Telephone Encounter (Signed)
Pt reporting soreness in bilateral upper extremities and numbness/tingling from toes to waist after being started on Crestor during recent hospitalization s/p PTCA. He previously was on a statin several years ago and had to stop it due to severe LE pain. He was restarted on another statin immediately post PTCA but had severe side effects on Sunday he was changed to Crestor.  He's wondering if livalo might be a potential alternative to statins, his wife is on that and has no side effects.  Denies chest tightness/pain/squeezing, VS today 142/69, Denies edema or shortness of breath.  pls advise, tx

## 2018-07-27 NOTE — Addendum Note (Signed)
Addended by: Jacques Earthly A on: 07/27/2018 03:41 PM   Modules accepted: Orders

## 2018-07-27 NOTE — Telephone Encounter (Signed)
Patient called and thinks he is having a reaction to his cholesterol medicine he was just put on the hospital.  Please call to discuss.  Patient can be reached at [phone]

## 2018-07-27 NOTE — Telephone Encounter (Signed)
Should it be taken with meals or at a particular time of day?

## 2018-07-28 MED ORDER — PITAVASTATIN CALCIUM 2 MG PO TABS: 2.0000 mg | ORAL_TABLET | Freq: Every day | ORAL | 1 refills | Status: DC

## 2018-07-28 NOTE — Addendum Note (Signed)
Addended by: Arville Care on: 07/28/2018 09:27 AM   Modules accepted: Orders

## 2018-08-01 ENCOUNTER — Telehealth: Payer: Self-pay | Admitting: Cardiology

## 2018-08-01 DIAGNOSIS — I2510 Atherosclerotic heart disease of native coronary artery without angina pectoris: Secondary | ICD-10-CM

## 2018-08-01 HISTORY — DX: Atherosclerotic heart disease of native coronary artery without angina pectoris: I25.10

## 2018-08-01 NOTE — Telephone Encounter (Signed)
Virtual Visit Pre-Appointment Phone Call  "(Name), I am calling you today to discuss your upcoming appointment. We are currently trying to limit exposure to the virus that causes COVID-19 by seeing patients at home rather than in the office."  1. "What is the BEST phone number to call the day of the visit?" - include this in appointment notes  2. Do you have or have access to (through a family member/friend) a smartphone with video capability that we can use for your visit?" a. If yes - list this number in appt notes as cell (if different from BEST phone #) and list the appointment type as a VIDEO visit in appointment notes b. If no - list the appointment type as a PHONE visit in appointment notes  3. Confirm consent - "In the setting of the current Covid19 crisis, you are scheduled for a (phone or video) visit with your provider on (date) at (time).  Just as we do with many in-office visits, in order for you to participate in this visit, we must obtain consent.  If you'd like, I can send this to your mychart (if signed up) or email for you to review.  Otherwise, I can obtain your verbal consent now.  All virtual visits are billed to your insurance company just like a normal visit would be.  By agreeing to a virtual visit, we'd like you to understand that the technology does not allow for your provider to perform an examination, and thus may limit your provider's ability to fully assess your condition. If your provider identifies any concerns that need to be evaluated in person, we will make arrangements to do so.  Finally, though the technology is pretty good, we cannot assure that it will always work on either your or our end, and in the setting of a video visit, we may have to convert it to a phone-only visit.  In either situation, we cannot ensure that we have a secure connection.  Are you willing to proceed?" STAFF: Did the patient verbally acknowledge consent to telehealth visit? Document  YES/NO here: Yes per pt  4. Advise patient to be prepared - "Two hours prior to your appointment, go ahead and check your blood pressure, pulse, oxygen saturation, and your weight (if you have the equipment to check those) and write them all down. When your visit starts, your provider will ask you for this information. If you have an Apple Watch or Kardia device, please plan to have heart rate information ready on the day of your appointment. Please have a pen and paper handy nearby the day of the visit as well."  5. Give patient instructions for MyChart download to smartphone OR Doximity/Doxy.me as below if video visit (depending on what platform provider is using)  6. Inform patient they will receive a phone call 15 minutes prior to their appointment time (may be from unknown caller ID) so they should be prepared to answer    TELEPHONE CALL NOTE  Adrian Bell has been deemed a candidate for a follow-up tele-health visit to limit community exposure during the Covid-19 pandemic. I spoke with the patient via phone to ensure availability of phone/video source, confirm preferred email & phone number, and discuss instructions and expectations.  I reminded Adrian Bell to be prepared with any vital sign and/or heart rhythm information that could potentially be obtained via home monitoring, at the time of his visit. I reminded Adrian Bell to expect a phone call prior to his  visit.  Adrian Bell 08/01/2018 2:16 PM   INSTRUCTIONS FOR DOWNLOADING THE MYCHART APP TO SMARTPHONE  - The patient must first make sure to have activated MyChart and know their login information - If Apple, go to [employer] and type in MyChart in the search bar and download the app. If Android, ask patient to go to [employer] and type in [place] in the search bar and download the app. The app is free but as with any other app downloads, their phone may require them to verify saved payment information or Apple/Android password.    - The patient will need to then log into the app with their MyChart username and password, and select [facility] as their healthcare provider to link the account. When it is time for your visit, go to the MyChart app, find appointments, and click Begin Video Visit. Be sure to Select Allow for your device to access the Microphone and Camera for your visit. You will then be connected, and your provider will be with you shortly.  **If they have any issues connecting, or need assistance please contact MyChart service desk [phone])**  **If using a computer, in order to ensure the best quality for their visit they will need to use either of the following Internet Browsers: [employer], or Google Chrome**  IF USING DOXIMITY or DOXY.ME - The patient will receive a link just prior to their visit by text.     FULL LENGTH CONSENT FOR TELE-HEALTH VISIT   I hereby voluntarily request, consent and authorize [place] and its employed or contracted physicians, physician assistants, nurse practitioners or other licensed health care professionals (the Practitioner), to provide me with telemedicine health care services (the Services") as deemed necessary by the treating Practitioner. I acknowledge and consent to receive the Services by the Practitioner via telemedicine. I understand that the telemedicine visit will involve communicating with the Practitioner through live audiovisual communication technology and the disclosure of certain medical information by electronic transmission. I acknowledge that I have been given the opportunity to request an in-person assessment or other available alternative prior to the telemedicine visit and am voluntarily participating in the telemedicine visit.  I understand that I have the right to withhold or withdraw my consent to the use of telemedicine in the course of my care at any time, without affecting my right to future care or treatment, and that  the Practitioner or I may terminate the telemedicine visit at any time. I understand that I have the right to inspect all information obtained and/or recorded in the course of the telemedicine visit and may receive copies of available information for a reasonable fee.  I understand that some of the potential risks of receiving the Services via telemedicine include:   Delay or interruption in medical evaluation due to technological equipment failure or disruption;  Information transmitted may not be sufficient (e.g. poor resolution of images) to allow for appropriate medical decision making by the Practitioner; and/or   In rare instances, security protocols could fail, causing a breach of personal health information.  Furthermore, I acknowledge that it is my responsibility to provide information about my medical history, conditions and care that is complete and accurate to the best of my ability. I acknowledge that Practitioner's advice, recommendations, and/or decision may be based on factors not within their control, such as incomplete or inaccurate data provided by me or distortions of diagnostic images or specimens that may result from electronic transmissions. I  understand that the practice of medicine is not an exact science and that Practitioner makes no warranties or guarantees regarding treatment outcomes. I acknowledge that I will receive a copy of this consent concurrently upon execution via email to the email address I last provided but may also request a printed copy by calling the office of [place].    I understand that my insurance will be billed for this visit.   I have read or had this consent read to me.  I understand the contents of this consent, which adequately explains the benefits and risks of the Services being provided via telemedicine.   I have been provided ample opportunity to ask questions regarding this consent and the Services and have had my questions answered to  my satisfaction.  I give my informed consent for the services to be provided through the use of telemedicine in my medical care  By participating in this telemedicine visit I agree to the above.

## 2018-08-01 NOTE — Progress Notes (Signed)
Virtual Visit via Telephone Note   This visit type was conducted due to national recommendations for restrictions regarding the COVID-19 Pandemic (e.g. social distancing) in an effort to limit this patient's exposure and mitigate transmission in our community.  Due to his co-morbid illnesses, this patient is at least at moderate risk for complications without adequate follow up.  This format is felt to be most appropriate for this patient at this time.  The patient did not have access to video technology/had technical difficulties with video requiring transitioning to audio format only (telephone).  All issues noted in this document were discussed and addressed.  No physical exam could be performed with this format.  Please refer to the patient's chart for his  consent to telehealth [facility].   Date:  08/02/2018   ID:  Adrian Bell, MRN [id number]  Patient Location: Home Provider Location: Office  PCP:  [facility]  Cardiologist:  Adrian HerrlichBrian Munley, MD  Electrophysiologist:  None   Evaluation Performed:  Follow-Up Visit  Chief Complaint:  FU after recent MI  History of Present Illness:    Adrian NissenLarry Bell is a 73 y.o. male with a hx of inferior STEMI with PCI and DES of RCA 07/21/18 at hospital discharge last seen 07/24/18.  Is very pleased with the quality of his life he feels so much better than his previous exercise intolerance and dyspnea has resolved except for 1 brief nonexertional sharp chest discomfort has had no chest pain.  Unfortunately is poorly statin intolerant and previously is taken a low-dose of Livalo he will start Zetia and will address whether PCSK9 is needed office follow-up.  He reviewed with me his history of a previous myocardial perfusion study a year ago that was unremarkable.  Diagnostic Studies/Procedures    Cath: 07/21/18   Ost RCA to Prox RCA lesion is 50% stenosed.  Mid RCA lesion is 100% stenosed.  A drug-eluting stent was  successfully placed using a STENT SYNERGY DES 2.75X32, postdilated 3.25 mm.  Post intervention, there is a 0% residual stenosis.  Ost 1st Mrg lesion is 50% stenosed.  Ost Cx to Mid Cx lesion is 25% stenosed.  Ost LAD to Prox LAD lesion is 50% stenosed.  Dist LAD lesion is 75% stenosed.  The left ventricular ejection fraction is 35-45% by visual estimate.  There is mild to moderate left ventricular systolic dysfunction.  LV end diastolic pressure is normal. LVEDP 10 mm Hg.  There is no aortic valve stenosis.  Diffusely diseased coronary arteries. Culprit vessel was RCA and this was successfully treated.   Would plan medical therapy for the rest of his CAD given that he was asymptomatic prior to this acute event.   If he has further angina, could consider PCI of the LAD.   Diagnostic  Dominance: Right    Intervention       The patient does not have symptoms concerning for COVID-19 infection (fever, chills, cough, or new shortness of breath).    Past Medical History:  Diagnosis Date  . Dysphagia   . Hyperlipidemia   . Kidney stones   . Myasthenia gravis (HCC)   . STEMI (ST elevation myocardial infarction) [facility](HCC)    Past Surgical History:  Procedure Laterality Date  . BACK SURGERY    . CORONARY/GRAFT ACUTE MI REVASCULARIZATION N/A 07/21/2018   Procedure: Coronary/Graft Acute MI Revascularization;  Surgeon: Corky CraftsVaranasi, Jayadeep S, MD;  [facility];  Service: Cardiovascular;  Laterality: N/A;  .  CYSTOSCOPY WITH INSERTION OF UROLIFT  2018  . LASIK    . LEFT HEART CATH AND CORONARY ANGIOGRAPHY N/A 07/21/2018   Procedure: LEFT HEART CATH AND CORONARY ANGIOGRAPHY;  Surgeon: [name], MD;  Location: [place] CV LAB;  Service: Cardiovascular;  Laterality: N/A;  . LITHOTRIPSY    . NECK SURGERY    . TONSILLECTOMY       Current Meds  Medication Sig  . aspirin 81 MG chewable tablet Chew 1 tablet (81 mg total) by mouth daily.  Marland Kitchen  CALCIUM-VITAMIN D PO Take 600 mg elemental calcium/kg/hr by mouth daily.  . metoprolol succinate (TOPROL-XL) 25 MG 24 hr tablet Take 0.5 tablets (12.5 mg total) by mouth daily.  . nitroGLYCERIN (NITROSTAT) 0.4 MG SL tablet Place 1 tablet (0.4 mg total) under the tongue every 5 (five) minutes as needed.  . NON FORMULARY Take 1 tablet by mouth every 3 (three) days. Rox-OTC medication for erectile dysfunction  . omeprazole (PRILOSEC) 20 MG capsule Take 20 mg by mouth daily as needed.  . Pitavastatin Calcium (LIVALO) 2 MG TABS Take 1 tablet (2 mg total) by mouth daily.  . predniSONE (DELTASONE) 5 MG tablet Take 5 mg by mouth daily with breakfast.  . pyridostigmine (MESTINON) 60 MG tablet Take 60 mg by mouth 2 (two) times a day.  . ramipril (ALTACE) 5 MG capsule Take 5 mg by mouth daily.   . ticagrelor (BRILINTA) 90 MG TABS tablet Take 1 tablet (90 mg total) by mouth 2 (two) times daily.     Allergies:   Demerol [meperidine hcl]   Social History   Tobacco Use  . Smoking status: Never Smoker  . Smokeless tobacco: Former Systems developer    Types: Chew  Substance Use Topics  . Alcohol use: No    Frequency: Never  . Drug use: No     Family Hx: The patient's family history includes Deep vein thrombosis in his brother; Diabetes in his sister; Esophageal cancer in his brother; Heart attack in his father and mother; Heart disease in his mother; Stroke in his brother and sister.  ROS:   Please see the history of present illness.     All other systems reviewed and are negative.   Prior CV studies:   The following studies were reviewed today:  TTE: 07/22/18 IMPRESSION 1. The left ventricle has normal systolic function, with an ejection fraction of 55-60%. The cavity size was normal. There is mild concentric left ventricular hypertrophy. Left ventricular diastolic parameters were normal. No evidence of left  ventricular regional wall motion abnormalities. 2. The right ventricle has normal systolic  function. The cavity was normal. There is no increase in right ventricular wall thickness. 3. The mitral valve is grossly normal. There is mild mitral annular calcification present. 4. The tricuspid valve is grossly normal. 5. The aortic valve is tricuspid.   Labs/Other Tests and Data Reviewed:    EKG:  An ECG dated 07/22/18 was personally reviewed today and demonstrated:  [place] RBBB  Recent Labs: 07/21/2018: ALT 15 07/22/2018: BUN 11; Creatinine, Ser 1.11; Hemoglobin 12.4; Platelets 190; Potassium 4.4; Sodium 141   Recent Lipid Panel Lab Results  Component Value Date/Time   CHOL 180 07/21/2018 10:54 PM   TRIG 127 07/21/2018 10:54 PM   HDL 59 07/21/2018 10:54 PM   CHOLHDL 3.1 07/21/2018 10:54 PM   LDLCALC 96 07/21/2018 10:54 PM    Wt Readings from Last 3 Encounters:  08/02/18 163 lb (73.9 kg)  07/24/18 167 lb  9.6 oz (76 kg)  06/26/18 167 lb (75.8 kg)     Objective:    Vital Signs:  BP 124/65 (BP Location: Left Arm, Patient Position: Sitting)   Pulse (!) 55   Ht 5\' 6"  (1.676 m)   Wt 163 lb (73.9 kg)   BMI 26.31 kg/m    VITAL SIGNS:  reviewed mood affect thought and cognition are normal and no respiratory distress  ASSESSMENT & PLAN:    1. Coronary artery disease with recent ST elevation right coronary artery MI with PCI and stent.  His hospital course was favorable he had normal left ventricular function no arrhythmia and since discharge has done nothing but improve his previous exercise intolerance and shortness of breath has resolved unfortunately has very poorly statin intolerant I am unsure whether he will be able to remain on Livalo and I decided to start Zetia plan to recheck his lipids in 1 month and if not at target will need PCSK9.  He will continue dual antiplatelet therapy.  He declines cardiac rehabilitation he is exercising 48 minutes a day he has had dietary instruction and no depression.  Fortunately he is retired 2. Hypertension stable continue ACE  inhibitor beta-blocker 3. Hyperlipidemia poorly statin intolerant I suspect as he has with all other statins will become intolerant to Livalo start Zetia plan to check lipids in 1 month and a decision about PCSK9 treatment 4. Myasthenia gravis stable  COVID-19 Education: The signs and symptoms of COVID-19 were discussed with the patient and how to seek care for testing (follow up with PCP or arrange E-visit).  The importance of social distancing was discussed today.  Time:   Today, I have spent 25 minutes with the patient with telehealth technology discussing the above problems.     Medication Adjustments/Labs and Tests Ordered: Current medicines are reviewed at length with the patient today.  Concerns regarding medicines are outlined above.   Tests Ordered: No orders of the defined types were placed in this encounter.   Medication Changes: No orders of the defined types were placed in this encounter.   Disposition:  Follow up in 6 week(s)  Signed, Adrian Herrlich, MD  08/02/2018 2:35 PM    [facility]

## 2018-08-02 ENCOUNTER — Other Ambulatory Visit: Payer: Self-pay

## 2018-08-02 ENCOUNTER — Encounter: Payer: Self-pay | Admitting: Cardiology

## 2018-08-02 ENCOUNTER — Telehealth (INDEPENDENT_AMBULATORY_CARE_PROVIDER_SITE_OTHER): Payer: Medicare HMO | Admitting: Cardiology

## 2018-08-02 VITALS — BP 124/65 | HR 55 | Ht 66.0 in | Wt 163.0 lb

## 2018-08-02 DIAGNOSIS — E785 Hyperlipidemia, unspecified: Secondary | ICD-10-CM

## 2018-08-02 DIAGNOSIS — I2119 ST elevation (STEMI) myocardial infarction involving other coronary artery of inferior wall: Secondary | ICD-10-CM

## 2018-08-02 DIAGNOSIS — G7000 Myasthenia gravis without (acute) exacerbation: Secondary | ICD-10-CM

## 2018-08-02 DIAGNOSIS — I2510 Atherosclerotic heart disease of native coronary artery without angina pectoris: Secondary | ICD-10-CM | POA: Diagnosis not present

## 2018-08-02 DIAGNOSIS — I10 Essential (primary) hypertension: Secondary | ICD-10-CM

## 2018-08-02 MED ORDER — PITAVASTATIN CALCIUM 2 MG PO TABS: 2.0000 mg | ORAL_TABLET | Freq: Every day | ORAL | 2 refills | Status: DC

## 2018-08-02 MED ORDER — EZETIMIBE 10 MG PO TABS: 10.0000 mg | ORAL_TABLET | Freq: Every day | ORAL | 2 refills | Status: DC

## 2018-08-02 NOTE — Patient Instructions (Signed)
Medication Instructions:  Your physician has recommended you make the following change in your medication:   START: Zetia 10 mg (1 tab) daily   If you need a refill on your cardiac medications before your next appointment, please call your pharmacy.   Lab work: None If you have labs (blood work) drawn today and your tests are completely normal, you will receive your results only by: Marland Kitchen MyChart Message (if you have MyChart) OR . A paper copy in the mail If you have any lab test that is abnormal or we need to change your treatment, we will call you to review the results.  Testing/Procedures: None  Follow-Up: At [facility], you and your health needs are our priority.  As part of our continuing mission to provide you with exceptional heart care, we have created designated Provider Care Teams.  These Care Teams include your primary Cardiologist (physician) and Advanced Practice Providers (APPs -  Physician Assistants and Nurse Practitioners) who all work together to provide you with the care you need, when you need it. You will need a follow up appointment in 6 weeks. Any Other Special Instructions Will Be Listed Below (If Applicable).

## 2018-08-02 NOTE — Addendum Note (Signed)
Addended by: Particia Nearing B on: 08/02/2018 02:57 PM   Modules accepted: Orders

## 2018-08-08 ENCOUNTER — Telehealth: Payer: Self-pay | Admitting: *Deleted

## 2018-08-08 ENCOUNTER — Encounter: Payer: Self-pay | Admitting: *Deleted

## 2018-08-08 MED ORDER — LIVALO 2 MG PO TABS: 2.0000 mg | ORAL_TABLET | Freq: Every day | ORAL | 1 refills | Status: DC

## 2018-08-08 NOTE — Telephone Encounter (Signed)
Completed prior authorization for livalo and sent to [facility]. Called patient to review statin medications he has tried in the past and is intolerant to. Patient has tried pravastatin, simvastatin, atorvastatin, and rosuvastatin and all cause myalgia.   Patient also requested that a prescription for livalo be sent to the [facility] pharmacy as he thinks he might be able to get it cheaper there even if the prior authorization is approved by [facility]. Prescription has been printed. Patient will call back with fax number.   Advised patient we would complete and send prior authorization for livalo and send a printed prescription to the [place] pharmacy to see which is a better deal for the patient. Patient is agreeable and verbalized understanding. No further questions.

## 2018-08-11 ENCOUNTER — Telehealth: Payer: Self-pay | Admitting: Cardiology

## 2018-08-11 NOTE — Telephone Encounter (Signed)
Patient informed to start taking zetia 10 mg daily in the evening. Advised patient that his prior authorization for livalo was approved as of 08/10/2018 by [facility]. Advised patient to contact his insurance company to be informed of next steps to getting this medication. Patient is agreeable and verbalized understanding. No further questions.

## 2018-08-11 NOTE — Telephone Encounter (Signed)
Please return call.

## 2018-08-11 NOTE — Telephone Encounter (Signed)
Attempted to return patient's call with no answer. Unable to leave voicemail. Will continue efforts.

## 2018-08-11 NOTE — Telephone Encounter (Signed)
Patient has questions about his Zetia. He has received it but not the Livalo. Should he go ahead and take the Zetia in the morning or afternoon without the Livalo? Please advise.

## 2018-08-14 ENCOUNTER — Telehealth: Payer: Self-pay | Admitting: Cardiology

## 2018-08-14 NOTE — Telephone Encounter (Signed)
Patient asked to speak to Adrian Bell and states he can not take Zetia due to intolerances - SOB, unsteady on feet, and zapped his energy.  Wants to know if he should still take Livalo.  Please call patient to discuss [phone]

## 2018-08-14 NOTE — Telephone Encounter (Signed)
Patient informed to stop taking zetia due to side effects. Advised patient to start taking livalo as prescribed as soon as he gets it filled at the [place]. Patient is agreeable and verbalized understanding. No further questions. Will make Dr. Bettina Gavia aware.

## 2018-08-14 NOTE — Telephone Encounter (Signed)
Patient called asking for his livalo prescription be sent to his PCP, Dr. Carloyn Manner, at the [facility] to be filled. Prescription has been successfully faxed to [phone] as requested.

## 2018-08-21 ENCOUNTER — Telehealth: Payer: Self-pay | Admitting: Cardiology

## 2018-08-21 NOTE — Telephone Encounter (Signed)
Fax a prescription to [place] [facility] .  Please put a rush on this per patient, he only has enough to get through the end of the week

## 2018-08-22 MED ORDER — TICAGRELOR 90 MG PO TABS: 90.0000 mg | ORAL_TABLET | Freq: Two times a day (BID) | ORAL | 1 refills | Status: DC

## 2018-08-22 NOTE — Telephone Encounter (Signed)
Brilinta refill faxed to [facility]. Attn: dr. Peterson Ao per pt's instruction. Fax Receipt confirmed at [place]. Phoned and informed patient. No further questions/concerns

## 2018-08-23 ENCOUNTER — Other Ambulatory Visit: Payer: Self-pay | Admitting: *Deleted

## 2018-08-23 ENCOUNTER — Telehealth: Payer: Self-pay | Admitting: Cardiology

## 2018-08-23 MED ORDER — METOPROLOL SUCCINATE ER 25 MG PO TB24: 12.5000 mg | ORAL_TABLET | Freq: Every day | ORAL | 1 refills | Status: DC

## 2018-08-23 NOTE — Telephone Encounter (Signed)
°*  STAT* If patient is at the pharmacy, call can be transferred to refill team.   1. Which medications need to be refilled? (please list name of each medication and dose if known) metoprolol succinate (TOPROL-XL) 25 MG 24 hr tablet  2. Which pharmacy/location (including street and city if local pharmacy) is medication to be sent to?  [place], [place] [phone] (Phone) [phone] (Fax)    3. Do they need a 30 day or 90 day supply? 90 day

## 2018-08-23 NOTE — Telephone Encounter (Signed)
Toprol XL 25 mg refilled. 

## 2018-09-01 ENCOUNTER — Telehealth: Payer: Self-pay | Admitting: Neurology

## 2018-09-01 MED ORDER — PYRIDOSTIGMINE BROMIDE 60 MG PO TABS: 60.0000 mg | ORAL_TABLET | Freq: Two times a day (BID) | ORAL | 3 refills | Status: DC

## 2018-09-01 MED ORDER — PREDNISONE 5 MG PO TABS: 5.0000 mg | ORAL_TABLET | Freq: Every day | ORAL | 3 refills | Status: DC

## 2018-09-01 NOTE — Telephone Encounter (Signed)
Patient is going out of town next week and his mail order shipment of prednisone and mestinon has not arrived.  He request rx be sent to [facility].  Please advise.

## 2018-09-01 NOTE — Telephone Encounter (Signed)
New Message  Patient verbalized wanting to speak to nurse in regards to medications.  Asked for medications and he advised he rather give it to the nurse.

## 2018-09-01 NOTE — Telephone Encounter (Signed)
Please inform patient Rx has been sent. Thanks.

## 2018-09-01 NOTE — Telephone Encounter (Signed)
Informed patient prescriptions were sent to pharmacy.  

## 2018-09-21 NOTE — Progress Notes (Signed)
Cardiology Office Note:    Date:  09/22/2018   ID:  Adrian Bell, DOB 06/30/1945, MRN [id number]  PCP:  [facility]  Cardiologist:  Norman HerrlichBrian Breeley Bischof, MD    Referring MD: [facility]    ASSESSMENT:    1. CAD in native artery   2. Essential hypertension   3. Hyperlipidemia, unspecified hyperlipidemia type   4. Myasthenia gravis (HCC)    PLAN:    In order of problems listed above:  1. CAD is stable compliant with medications including dual antiplatelet walking 3 miles a day has no anginal discomfort on current treatment and after PCI New York Heart Association class I.  I will see him back in my office in 6 months and consider an ischemia evaluation as he had a moderate residual LAD stenosis.  At this time I would not recommend further revascularization. 2. Stable hypertension continue ACE inhibitor beta-blocker 3. He is on a low intensity statin and not can intensify drug therapy with his neuromuscular disease 4. Stable managed by neurology   Next appointment: 3 months   Medication Adjustments/Labs and Tests Ordered: Current medicines are reviewed at length with the patient today.  Concerns regarding medicines are outlined above.  No orders of the defined types were placed in this encounter.  No orders of the defined types were placed in this encounter.   No chief complaint on file.   History of Present Illness:    Adrian NissenLarry Bell is a 73 y.o. male with a hx of inferior STEMI with PCI and DES of RCA 07/21/18   last seen 08/02/2018 virtual visit. Compliance with diet, lifestyle and medications: yes  Diagnostic Studies/Procedures   Cath: 07/21/18   Ost RCA to Prox RCA lesion is 50% stenosed.  Mid RCA lesion is 100% stenosed.  A drug-eluting stent was successfully placed using a STENT SYNERGY DES 2.75X32, postdilated 3.25 mm.  Post intervention, there is a 0% residual stenosis.  Ost 1st Mrg lesion is 50% stenosed.  Ost Cx to Mid Cx lesion is 25% stenosed.   Ost LAD to Prox LAD lesion is 50% stenosed.  Dist LAD lesion is 75% stenosed.  The left ventricular ejection fraction is 35-45% by visual estimate.  There is mild to moderate left ventricular systolic dysfunction.  LV end diastolic pressure is normal. LVEDP 10 mm Hg.  There is no aortic valve stenosis.  Diffusely diseased coronary arteries. Culprit vessel was RCA and this was successfully treated.   Would plan medical therapy for the rest of his CAD given that he was asymptomatic prior to this acute event.   If he has further angina, could consider PCI of the LAD.  Diagnostic  Dominance: Right   Intervention       Past Medical History:  Diagnosis Date  . Dysphagia   . Hyperlipidemia   . Kidney stones   . Myasthenia gravis (HCC)   . STEMI (ST elevation myocardial infarction) [facility](HCC)     Past Surgical History:  Procedure Laterality Date  . BACK SURGERY    . CORONARY/GRAFT ACUTE MI REVASCULARIZATION N/A 07/21/2018   Procedure: Coronary/Graft Acute MI Revascularization;  Surgeon: Corky CraftsVaranasi, Jayadeep S, MD;  [facility];  Service: Cardiovascular;  Laterality: N/A;  . CYSTOSCOPY WITH INSERTION OF UROLIFT  2018  . LASIK    . LEFT HEART CATH AND CORONARY ANGIOGRAPHY N/A 07/21/2018   Procedure: LEFT HEART CATH AND CORONARY ANGIOGRAPHY;  Surgeon: Corky CraftsVaranasi, Jayadeep S, MD;  [facility];  Service: Cardiovascular;  Laterality: N/A;  . LITHOTRIPSY    . NECK SURGERY    . TONSILLECTOMY      Current Medications: Current Meds  Medication Sig  . aspirin 81 MG chewable tablet Chew 1 tablet (81 mg total) by mouth daily.  Marland Kitchen CALCIUM-VITAMIN D PO Take 600 mg elemental calcium/kg/hr by mouth daily.  . metoprolol succinate (TOPROL-XL) 25 MG 24 hr tablet Take 0.5 tablets (12.5 mg total) by mouth daily.  . nitroGLYCERIN (NITROSTAT) 0.4 MG SL tablet Place 1 tablet (0.4 mg total) under the tongue every 5 (five) minutes as needed.  . NON FORMULARY  Take 1 tablet by mouth every 3 (three) days. Rox-OTC medication for erectile dysfunction  . omeprazole (PRILOSEC) 20 MG capsule Take 20 mg by mouth daily as needed.  . Pitavastatin Calcium (LIVALO) 2 MG TABS Take 1 tablet (2 mg total) by mouth daily.  . predniSONE (DELTASONE) 5 MG tablet Take 1 tablet (5 mg total) by mouth daily.  Marland Kitchen pyridostigmine (MESTINON) 60 MG tablet Take 1 tablet (60 mg total) by mouth 2 (two) times a day.  . ramipril (ALTACE) 5 MG capsule Take 5 mg by mouth daily.   . ticagrelor (BRILINTA) 90 MG TABS tablet Take 1 tablet (90 mg total) by mouth 2 (two) times daily.     Allergies:   Zetia [ezetimibe], Demerol [meperidine hcl], and Statins   Social History   Socioeconomic History  . Marital status: Married    Spouse name: Not on file  . Number of children: 3  . Years of education: 33  . Highest education level: Not on file  Occupational History  . Occupation: retired    Fish farm manager: [employer]  Social Needs  . Financial resource strain: Not on file  . Food insecurity    Worry: Not on file    Inability: Not on file  . Transportation needs    Medical: Not on file    Non-medical: Not on file  Tobacco Use  . Smoking status: Never Smoker  . Smokeless tobacco: Former Systems developer    Types: Chew  Substance and Sexual Activity  . Alcohol use: No    Frequency: Never  . Drug use: No  . Sexual activity: Not on file  Lifestyle  . Physical activity    Days per week: Not on file    Minutes per session: Not on file  . Stress: Not on file  Relationships  . Social Herbalist on phone: Not on file    Gets together: Not on file    Attends religious service: Not on file    Active member of club or organization: Not on file    Attends meetings of clubs or organizations: Not on file    Relationship status: Not on file  Other Topics Concern  . Not on file  Social History Narrative   Lives with wife in a one story home.  Has 3 children.     Retired Armed forces operational officer.     Education: some college.      Family History: The patient's family history includes Deep vein thrombosis in his brother; Diabetes in his sister; Esophageal cancer in his brother; Heart attack in his father and mother; Heart disease in his mother; Stroke in his brother and sister. ROS:   Please see the history of present illness.    All other systems reviewed and are negative.  EKGs/Labs/Other Studies Reviewed:    The following studies were reviewed today:  EKG:  EKG ordered today and personally reviewed.  The ekg ordered today demonstrates [place] RBBB  Echo 07/22/2018:  IMPRESSIONS  1. The left ventricle has normal systolic function, with an ejection fraction of 55-60%. The cavity size was normal. There is mild concentric left ventricular hypertrophy. Left ventricular diastolic parameters were normal. No evidence of left  ventricular regional wall motion abnormalities.  2. The right ventricle has normal systolic function. The cavity was normal. There is no increase in right ventricular wall thickness.  3. The mitral valve is grossly normal. There is mild mitral annular calcification present.  4. The tricuspid valve is grossly normal.  5. The aortic valve is tricuspid.  Recent Labs: 07/21/2018: ALT 15 07/22/2018: BUN 11; Creatinine, Ser 1.11; Hemoglobin 12.4; Platelets 190; Potassium 4.4; Sodium 141  Recent Lipid Panel    Component Value Date/Time   CHOL 180 07/21/2018 2254   TRIG 127 07/21/2018 2254   HDL 59 07/21/2018 2254   CHOLHDL 3.1 07/21/2018 2254   VLDL 25 07/21/2018 2254   LDLCALC 96 07/21/2018 2254    Physical Exam:    VS:  BP (!) 158/62 (BP Location: Left Arm, Patient Position: Sitting)   Pulse (!) 49   Ht 5\' 6"  (1.676 m)   Wt 172 lb 12.8 oz (78.4 kg)   BMI 27.89 kg/m     Wt Readings from Last 3 Encounters:  09/22/18 172 lb 12.8 oz (78.4 kg)  08/02/18 163 lb (73.9 kg)  07/24/18 167 lb 9.6 oz (76 kg)     GEN:  Well nourished, well  developed in no acute distress HEENT: Normal NECK: No JVD; No carotid bruits LYMPHATICS: No lymphadenopathy CARDIAC: RRR, no murmurs, rubs, gallops RESPIRATORY:  Clear to auscultation without rales, wheezing or rhonchi  ABDOMEN: Soft, non-tender, non-distended MUSCULOSKELETAL:  No edema; No deformity  SKIN: Warm and dry NEUROLOGIC:  Alert and oriented x 3 PSYCHIATRIC:  Normal affect    Signed, Norman Herrlich, MD  09/22/2018 11:07 AM    [facility]

## 2018-09-22 ENCOUNTER — Encounter: Payer: Self-pay | Admitting: Cardiology

## 2018-09-22 ENCOUNTER — Other Ambulatory Visit: Payer: Self-pay

## 2018-09-22 ENCOUNTER — Ambulatory Visit (INDEPENDENT_AMBULATORY_CARE_PROVIDER_SITE_OTHER): Payer: Medicare HMO | Admitting: Cardiology

## 2018-09-22 VITALS — BP 158/62 | HR 49 | Ht 66.0 in | Wt 172.8 lb

## 2018-09-22 DIAGNOSIS — G7000 Myasthenia gravis without (acute) exacerbation: Secondary | ICD-10-CM | POA: Diagnosis not present

## 2018-09-22 DIAGNOSIS — I10 Essential (primary) hypertension: Secondary | ICD-10-CM

## 2018-09-22 DIAGNOSIS — I2510 Atherosclerotic heart disease of native coronary artery without angina pectoris: Secondary | ICD-10-CM | POA: Diagnosis not present

## 2018-09-22 DIAGNOSIS — E785 Hyperlipidemia, unspecified: Secondary | ICD-10-CM

## 2018-09-22 NOTE — Patient Instructions (Signed)
Medication Instructions:  Your physician has recommended you make the following change in your medication:   STOP prilosec  If you need a refill on your cardiac medications before your next appointment, please call your pharmacy.   Lab work: None  If you have labs (blood work) drawn today and your tests are completely normal, you will receive your results only by: Marland Kitchen MyChart Message (if you have MyChart) OR . A paper copy in the mail If you have any lab test that is abnormal or we need to change your treatment, we will call you to review the results.  Testing/Procedures: You had an EKG today.   Follow-Up: At [facility], you and your health needs are our priority.  As part of our continuing mission to provide you with exceptional heart care, we have created designated Provider Care Teams.  These Care Teams include your primary Cardiologist (physician) and Advanced Practice Providers (APPs -  Physician Assistants and Nurse Practitioners) who all work together to provide you with the care you need, when you need it. You will need a follow up appointment in 3 months.  Please call our office 2 months in advance to schedule this appointment.

## 2018-10-06 ENCOUNTER — Ambulatory Visit: Payer: Medicare HMO | Admitting: Neurology

## 2018-10-18 ENCOUNTER — Other Ambulatory Visit: Payer: Self-pay | Admitting: Neurology

## 2018-10-18 ENCOUNTER — Encounter: Payer: Self-pay | Admitting: Neurology

## 2018-10-19 ENCOUNTER — Other Ambulatory Visit: Payer: Self-pay

## 2018-10-19 MED ORDER — PREDNISONE 5 MG PO TABS: 5.0000 mg | ORAL_TABLET | Freq: Every day | ORAL | 3 refills | Status: DC

## 2018-10-20 ENCOUNTER — Other Ambulatory Visit: Payer: Self-pay

## 2018-10-20 ENCOUNTER — Telehealth (INDEPENDENT_AMBULATORY_CARE_PROVIDER_SITE_OTHER): Payer: Medicare HMO | Admitting: Neurology

## 2018-10-20 VITALS — BP 128/57 | HR 55 | Wt 167.0 lb

## 2018-10-20 DIAGNOSIS — G7000 Myasthenia gravis without (acute) exacerbation: Secondary | ICD-10-CM | POA: Diagnosis not present

## 2018-10-20 NOTE — Progress Notes (Signed)
    Virtual Visit via Telephone Note The purpose of this virtual visit is to provide medical care while limiting exposure to the novel coronavirus.    Consent was obtained for phone visit:  Yes.   Answered questions that patient had about telehealth interaction:  Yes.   I discussed the limitations, risks, security and privacy concerns of performing an evaluation and management service by telephone. I also discussed with the patient that there may be a patient responsible charge related to this service. The patient expressed understanding and agreed to proceed.  Pt location: Home Physician Location: office Name of referring provider:  [place] connected with .Lindalou Hose at patients initiation/request on 10/20/2018 at  7:50 AM EDT by telephone and verified that I am speaking with the correct person using two identifiers.  Pt MRN:  [id number] Pt DOB:  07/03/1945   History of Present Illness:   This is a 73 year old man returning for follow-up of seropositive myasthenia gravis.  His myasthenia is very well controlled on prednisone 5mg  daily and mestinon 60mg  BID.  No problems with double vision, ptosis, or weakness.    He was hospitalized in May with acute MI and found to have 100% RCA stenosis s/p PCI with DES.  He recovered very well and has been able to resume walking 3 miles.      Observations/Objective:   Today's Vitals   10/20/18 0803  BP: (!) 128/57  Pulse: (!) 55  Weight: 167 lb (75.8 kg)   Body mass index is 26.95 kg/m.   He is awake and oriented.  Speech is clear, no dysarthria.  BP 146/62, 165lb   Assessment and Plan:   Seropositive bulbar myasthenia gravis without exacerbation (March 2019).  Clinically doing very well.  He developed breakthrough symptoms on prednisone taper when he was at 2.5mg  daily. Continue prednisone 5mg  daily, if doing well in the fall, consider tapering further Continue Mestinon 60 mg twice daily.   Follow Up Instructions:   I  discussed the assessment and treatment plan with the patient. The patient was provided an opportunity to ask questions and all were answered. The patient agreed with the plan and demonstrated an understanding of the instructions.   The patient was advised to call back or seek an in-person evaluation if the symptoms worsen or if the condition fails to improve as anticipated.  Return to clinic in 3 months  Total Time spent in visit with the patient was:  22 min, of which 100% of the time was spent in counseling and/or coordinating care.   Pt understands and agrees with the plan of care outlined.     Alda Berthold, DO

## 2018-10-23 ENCOUNTER — Ambulatory Visit: Payer: Medicare HMO | Admitting: Neurology

## 2018-11-24 ENCOUNTER — Telehealth: Payer: Self-pay | Admitting: Cardiology

## 2018-11-24 NOTE — Telephone Encounter (Signed)
Attempted to contact patient with no answer, unable to leave message. Will continue efforts.  

## 2018-11-24 NOTE — Telephone Encounter (Signed)
Pt had a procedure recently and has been experiencing chest discomfort and taking nitro

## 2018-11-27 DIAGNOSIS — N3020 Other chronic cystitis without hematuria: Secondary | ICD-10-CM | POA: Diagnosis not present

## 2018-11-27 DIAGNOSIS — N400 Enlarged prostate without lower urinary tract symptoms: Secondary | ICD-10-CM | POA: Diagnosis not present

## 2018-11-27 DIAGNOSIS — M545 Low back pain: Secondary | ICD-10-CM | POA: Diagnosis not present

## 2018-11-28 ENCOUNTER — Telehealth: Payer: Self-pay | Admitting: Cardiology

## 2018-11-28 NOTE — Telephone Encounter (Signed)
Been having some unusual CP

## 2018-11-28 NOTE — Telephone Encounter (Signed)
Duplicate encounter. Please see previous encounter.  

## 2018-11-28 NOTE — Telephone Encounter (Signed)
Spoke with patient who explained for the past 3 weeks he has been experiencing mild shortness of breath and intermittent chest pain. He went on to describe the chest pain as an achy and burning sensation at times. Patient reports taking 1 nitroglycerin tablet on 2 occasions over the past few weeks and this has resolved his symptoms. Patient denies any symptoms now. His BP yesterday at his urology appointment was 140/62. He does not check his vital signs at home. Patient has been scheduled for a follow up appointment with Dr. Bettina Gavia on Friday, 12/01/2018, at 3:55 pm for further evaluation. Advised him to go to the nearest emergency department if symptoms worsen before his appointment. Patient is agreeable and verbalized understanding.   Please advise of any further recommendations. Thanks!

## 2018-11-29 NOTE — H&P (View-Only) (Signed)
Cardiology Office Note:    Date:  12/01/2018   ID:  Adrian Bell, DOB May 13, 1945, MRN [id number]  PCP:  [facility]  Cardiologist:  Norman Herrlich, MD    Referring MD: [facility]    ASSESSMENT:    1. CAD in native artery   2. Essential hypertension   3. Hyperlipidemia, unspecified hyperlipidemia type   4. Chest pain, unspecified type    PLAN:    In order of problems listed above:  1. He is symptomatic and although somewhat atypical I think this is anginal I told her the options included intensified medical treatment and close observation and an ischemia evaluation either noninvasive or invasive.  He prefers a noninvasive myocardial perfusion study and if abnormal with demonstrable ischemia agrees to undergo coronary angiography.  For high degree of reassurance especially with [place] leaving Monday we will draw troponin today and if abnormal will need to be treated as acute coronary syndrome. 2. Stable hypertension continue beta-blocker ACE inhibitor 3. Stable dyslipidemia continue statin   Next appointment: 4 weeks    Medication Adjustments/Labs and Tests Ordered: Current medicines are reviewed at length with the patient today.  Concerns regarding medicines are outlined above.  Orders Placed This Encounter  Procedures  . Troponin I  . MYOCARDIAL PERFUSION IMAGING  . EKG 12-Lead   No orders of the defined types were placed in this encounter.   No chief complaint on file.   History of Present Illness:    Adrian Bell is a 73 y.o. male with a hx of  inferior STEMI with PCI and DES of RCA 07/21/18  last seen 09/22/2018.  He has been seen today as a work in with a phone to our office that he is having chest pain. Compliance with diet, lifestyle and medications: Yes  He is seen today because he found our office that he took nitroglycerin.  For several months he has been getting a vague burning in his chest some palpitation  typically occurs in the morning without activity but can wax and wane for hours.  Recently had a more severe episode on 2 occasions taken nitroglycerin with relief.  I went back and reviewed his cath records he had an LAD stenosis and a notation of his having angina that he would benefit from further PCI and stent.  I presented this to him he would prefer to have a noninvasive test we will set up a myocardial perfusion study he is planning on traveling next week and for assurance of monitor on troponin today it is elevated he need to be treated as acute coronary syndrome.  He will continue his uninterrupted dual antiplatelet therapy beta-blocker along with very low-dose statin.  If he has demonstrable ischemia he will undergo coronary angiography and further revascularization.  He has had no edema shortness of breath orthopnea or syncope.  He has myasthenia gravis is stable at this time Past Medical History:  Diagnosis Date  . Dysphagia   . Hyperlipidemia   . Kidney stones   . Myasthenia gravis (HCC)   . STEMI (ST elevation myocardial infarction) [facility])     Past Surgical History:  Procedure Laterality Date  . BACK SURGERY    . CARDIAC CATHETERIZATION    . CORONARY/GRAFT ACUTE MI REVASCULARIZATION N/A 07/21/2018   Procedure: Coronary/Graft Acute MI Revascularization;  Surgeon: Corky Crafts, MD;  Location: [facility] INVASIVE CV LAB;  Service: Cardiovascular;  Laterality: N/A;  . CYSTOSCOPY WITH INSERTION  OF UROLIFT  2018  . LASIK    . LEFT HEART CATH AND CORONARY ANGIOGRAPHY N/A 07/21/2018   Procedure: LEFT HEART CATH AND CORONARY ANGIOGRAPHY;  Surgeon: Corky Crafts, MD;  Location: [facility] INVASIVE CV LAB;  Service: Cardiovascular;  Laterality: N/A;  . LITHOTRIPSY    . NECK SURGERY    . TONSILLECTOMY      Current Medications: Current Meds  Medication Sig  . aspirin 81 MG chewable tablet Chew 1 tablet (81 mg total) by mouth daily.  Marland Kitchen CALCIUM-VITAMIN D PO Take 600 mg elemental calcium/kg/hr  by mouth daily.  . metoprolol succinate (TOPROL-XL) 25 MG 24 hr tablet Take 0.5 tablets (12.5 mg total) by mouth daily.  . mirabegron ER (MYRBETRIQ) 25 MG TB24 tablet Take 25 mg by mouth daily.  . nitroGLYCERIN (NITROSTAT) 0.4 MG SL tablet Place 1 tablet (0.4 mg total) under the tongue every 5 (five) minutes as needed.  . NON FORMULARY Take 1 tablet by mouth every 3 (three) days. Rox-OTC medication for erectile dysfunction  . Pitavastatin Calcium (LIVALO) 2 MG TABS Take 1 tablet (2 mg total) by mouth daily.  . predniSONE (DELTASONE) 5 MG tablet Take 1 tablet (5 mg total) by mouth daily.  Marland Kitchen pyridostigmine (MESTINON) 60 MG tablet Take 1 tablet (60 mg total) by mouth 2 (two) times a day.  . ramipril (ALTACE) 5 MG capsule Take 5 mg by mouth daily.   . ticagrelor (BRILINTA) 90 MG TABS tablet Take 1 tablet (90 mg total) by mouth 2 (two) times daily.     Allergies:   Zetia [ezetimibe], Demerol [meperidine hcl], and Statins   Social History   Socioeconomic History  . Marital status: Married    Spouse name: Not on file  . Number of children: 3  . Years of education: 13  . Highest education level: Not on file  Occupational History  . 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 Financial resource strain: Not on file  . Food insecurity    Worry: Not on file    Inability: Not on file  . Transportation needs    Medical: Not on file    Non-medical: Not on file  Tobacco Use  . Smoking status: Never Smoker  . Smokeless tobacco: Former Neurosurgeon    Types: Chew  Substance and Sexual Activity  . Alcohol use: Yes    Frequency: Never    Comment: 4 ounces of red wine each night  . Drug use: No  . Sexual activity: Not on file  Lifestyle  . Physical activity    Days per week: Not on file    Minutes per session: Not on file  . Stress: Not on file  Relationships  . Social Musician on phone: Not on file    Gets together: Not on file    Attends religious service: Not on  file    Active member of club or organization: Not on file    Attends meetings of clubs or organizations: Not on file    Relationship status: Not on file  Other Topics Concern  . Not on file  Social History Narrative   Lives with wife in a one story home.  Has 3 children.     Retired Public librarian.     Education: some college.      Family History: The patient's family history includes Deep vein thrombosis in his brother; Diabetes in his sister; Esophageal cancer in his  brother; Heart attack in his father and mother; Heart disease in his mother; Stroke in his brother and sister. ROS:   Please see the history of present illness.    All other systems reviewed and are negative.  EKGs/Labs/Other Studies Reviewed:    The following studies were reviewed today:  EKG:  EKG ordered today and personally reviewed.  The ekg ordered today demonstrates sinus rhythm right bundle branch block inferior infarction old and is unchanged from the EKG of 09/22/2018  Recent Labs: 07/21/2018: ALT 15 07/22/2018: BUN 11; Creatinine, Ser 1.11; Hemoglobin 12.4; Platelets 190; Potassium 4.4; Sodium 141  Recent Lipid Panel    Component Value Date/Time   CHOL 180 07/21/2018 2254   TRIG 127 07/21/2018 2254   HDL 59 07/21/2018 2254   CHOLHDL 3.1 07/21/2018 2254   VLDL 25 07/21/2018 2254   LDLCALC 96 07/21/2018 2254    Physical Exam:    VS:  BP (!) 144/82 (BP Location: Right Arm, Patient Position: Sitting, Cuff Size: Normal)   Pulse 66   Ht 5' 5.5" (1.664 m)   Wt 170 lb (77.1 kg)   SpO2 99%   BMI 27.86 kg/m     Wt Readings from Last 3 Encounters:  12/01/18 170 lb (77.1 kg)  10/20/18 167 lb (75.8 kg)  09/22/18 172 lb 12.8 oz (78.4 kg)     GEN:  Well nourished, well developed in no acute distress HEENT: Normal NECK: No JVD; No carotid bruits LYMPHATICS: No lymphadenopathy CARDIAC: RRR, no murmurs, rubs, gallops RESPIRATORY:  Clear to auscultation without rales, wheezing  or rhonchi  ABDOMEN: Soft, non-tender, non-distended MUSCULOSKELETAL:  No edema; No deformity  SKIN: Warm and dry NEUROLOGIC:  Alert and oriented x 3 PSYCHIATRIC:  Normal affect    Signed, Shirlee More, MD  12/01/2018 4:28 PM    [facility]

## 2018-11-29 NOTE — Progress Notes (Signed)
Cardiology Office Note:    Date:  12/01/2018   ID:  Adrian Bell, DOB 05/26/1945, MRN [id number]  PCP:  [facility]  Cardiologist:  Roshana Shuffield, MD    Referring MD: [facility]    ASSESSMENT:    1. CAD in native artery   2. Essential hypertension   3. Hyperlipidemia, unspecified hyperlipidemia type   4. Chest pain, unspecified type    PLAN:    In order of problems listed above:  1. He is symptomatic and although somewhat atypical I think this is anginal I told her the options included intensified medical treatment and close observation and an ischemia evaluation either noninvasive or invasive.  He prefers a noninvasive myocardial perfusion study and if abnormal with demonstrable ischemia agrees to undergo coronary angiography.  For high degree of reassurance especially [place] leaving Monday we will draw troponin today and if abnormal will need to be treated as acute coronary syndrome. 2. Stable hypertension continue beta-blocker ACE inhibitor 3. Stable dyslipidemia continue statin   Next appointment: 4 weeks    Medication Adjustments/Labs and Tests Ordered: Current medicines are reviewed at length with the patient today.  Concerns regarding medicines are outlined above.  Orders Placed This Encounter  Procedures  . Troponin I  . MYOCARDIAL PERFUSION IMAGING  . EKG 12-Lead   No orders of the defined types were placed in this encounter.   No chief complaint on file.   History of Present Illness:    Adrian Bell is a 73 y.o. male with a hx of  inferior STEMI with PCI and DES of RCA 07/21/18  last seen 09/22/2018.  He has been seen today as a work in with a phone to our office that he is having chest pain. Compliance with diet, lifestyle and medications: Yes  He is seen today because he found our office that he took nitroglycerin.  For several months he has been getting a vague burning in his chest some palpitation  typically occurs in the morning without activity but can wax and wane for hours.  Recently had a more severe episode on 2 occasions taken nitroglycerin with relief.  I went back and reviewed his cath records he had an LAD stenosis and a notation of his having angina that he would benefit from further PCI and stent.  I presented this to him he would prefer to have a noninvasive test we will set up a myocardial perfusion study he is planning on traveling next week and for assurance of monitor on troponin today it is elevated he need to be treated as acute coronary syndrome.  He will continue his uninterrupted dual antiplatelet therapy beta-blocker along with very low-dose statin.  If he has demonstrable ischemia he will undergo coronary angiography and further revascularization.  He has had no edema shortness of breath orthopnea or syncope.  He has myasthenia gravis is stable at this time Past Medical History:  Diagnosis Date  . Dysphagia   . Hyperlipidemia   . Kidney stones   . Myasthenia gravis (HCC)   . STEMI (ST elevation myocardial infarction) (HCC)     Past Surgical History:  Procedure Laterality Date  . BACK SURGERY    . CARDIAC CATHETERIZATION    . CORONARY/GRAFT ACUTE MI REVASCULARIZATION N/A 07/21/2018   Procedure: Coronary/Graft Acute MI Revascularization;  Surgeon: Varanasi, Jayadeep S, MD;  Location: [facility] INVASIVE CV LAB;  Service: Cardiovascular;  Laterality: N/A;  . CYSTOSCOPY WITH INSERTION   OF UROLIFT  2018  . LASIK    . LEFT HEART CATH AND CORONARY ANGIOGRAPHY N/A 07/21/2018   Procedure: LEFT HEART CATH AND CORONARY ANGIOGRAPHY;  Surgeon: Corky Crafts, MD;  Location: [facility] INVASIVE CV LAB;  Service: Cardiovascular;  Laterality: N/A;  . LITHOTRIPSY    . NECK SURGERY    . TONSILLECTOMY      Current Medications: Current Meds  Medication Sig  . aspirin 81 MG chewable tablet Chew 1 tablet (81 mg total) by mouth daily.  Marland Kitchen CALCIUM-VITAMIN D PO Take 600 mg elemental calcium/kg/hr  by mouth daily.  . metoprolol succinate (TOPROL-XL) 25 MG 24 hr tablet Take 0.5 tablets (12.5 mg total) by mouth daily.  . mirabegron ER (MYRBETRIQ) 25 MG TB24 tablet Take 25 mg by mouth daily.  . nitroGLYCERIN (NITROSTAT) 0.4 MG SL tablet Place 1 tablet (0.4 mg total) under the tongue every 5 (five) minutes as needed.  . NON FORMULARY Take 1 tablet by mouth every 3 (three) days. Rox-OTC medication for erectile dysfunction  . Pitavastatin Calcium (LIVALO) 2 MG TABS Take 1 tablet (2 mg total) by mouth daily.  . predniSONE (DELTASONE) 5 MG tablet Take 1 tablet (5 mg total) by mouth daily.  Marland Kitchen pyridostigmine (MESTINON) 60 MG tablet Take 1 tablet (60 mg total) by mouth 2 (two) times a day.  . ramipril (ALTACE) 5 MG capsule Take 5 mg by mouth daily.   . ticagrelor (BRILINTA) 90 MG TABS tablet Take 1 tablet (90 mg total) by mouth 2 (two) times daily.     Allergies:   Zetia [ezetimibe], Demerol [meperidine hcl], and Statins   Social History   Socioeconomic History  . Marital status: Married    Spouse name: Not on file  . Number of children: 3  . Years of education: 13  . Highest education level: Not on file  Occupational History  . 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 Financial resource strain: Not on file  . Food insecurity    Worry: Not on file    Inability: Not on file  . Transportation needs    Medical: Not on file    Non-medical: Not on file  Tobacco Use  . Smoking status: Never Smoker  . Smokeless tobacco: Former Neurosurgeon    Types: Chew  Substance and Sexual Activity  . Alcohol use: Yes    Frequency: Never    Comment: 4 ounces of red wine each night  . Drug use: No  . Sexual activity: Not on file  Lifestyle  . Physical activity    Days per week: Not on file    Minutes per session: Not on file  . Stress: Not on file  Relationships  . Social Musician on phone: Not on file    Gets together: Not on file    Attends religious service: Not on  file    Active member of club or organization: Not on file    Attends meetings of clubs or organizations: Not on file    Relationship status: Not on file  Other Topics Concern  . Not on file  Social History Narrative   Lives with wife in a one story home.  Has 3 children.     Retired Public librarian.     Education: some college.      Family History: The patient's family history includes Deep vein thrombosis in his brother; Diabetes in his sister; Esophageal cancer in his  brother; Heart attack in his father and mother; Heart disease in his mother; Stroke in his brother and sister. ROS:   Please see the history of present illness.    All other systems reviewed and are negative.  EKGs/Labs/Other Studies Reviewed:    The following studies were reviewed today:  EKG:  EKG ordered today and personally reviewed.  The ekg ordered today demonstrates sinus rhythm right bundle branch block inferior infarction old and is unchanged from the EKG of 09/22/2018  Recent Labs: 07/21/2018: ALT 15 07/22/2018: BUN 11; Creatinine, Ser 1.11; Hemoglobin 12.4; Platelets 190; Potassium 4.4; Sodium 141  Recent Lipid Panel    Component Value Date/Time   CHOL 180 07/21/2018 2254   TRIG 127 07/21/2018 2254   HDL 59 07/21/2018 2254   CHOLHDL 3.1 07/21/2018 2254   VLDL 25 07/21/2018 2254   LDLCALC 96 07/21/2018 2254    Physical Exam:    VS:  BP (!) 144/82 (BP Location: Right Arm, Patient Position: Sitting, Cuff Size: Normal)   Pulse 66   Ht 5' 5.5" (1.664 m)   Wt 170 lb (77.1 kg)   SpO2 99%   BMI 27.86 kg/m     Wt Readings from Last 3 Encounters:  12/01/18 170 lb (77.1 kg)  10/20/18 167 lb (75.8 kg)  09/22/18 172 lb 12.8 oz (78.4 kg)     GEN:  Well nourished, well developed in no acute distress HEENT: Normal NECK: No JVD; No carotid bruits LYMPHATICS: No lymphadenopathy CARDIAC: RRR, no murmurs, rubs, gallops RESPIRATORY:  Clear to auscultation without rales, wheezing  or rhonchi  ABDOMEN: Soft, non-tender, non-distended MUSCULOSKELETAL:  No edema; No deformity  SKIN: Warm and dry NEUROLOGIC:  Alert and oriented x 3 PSYCHIATRIC:  Normal affect    Signed, Shirlee More, MD  12/01/2018 4:28 PM    [facility]

## 2018-12-01 ENCOUNTER — Encounter: Payer: Self-pay | Admitting: Cardiology

## 2018-12-01 ENCOUNTER — Other Ambulatory Visit: Payer: Self-pay

## 2018-12-01 ENCOUNTER — Ambulatory Visit (INDEPENDENT_AMBULATORY_CARE_PROVIDER_SITE_OTHER): Payer: Medicare HMO | Admitting: Cardiology

## 2018-12-01 ENCOUNTER — Encounter: Payer: Self-pay | Admitting: *Deleted

## 2018-12-01 VITALS — BP 144/82 | HR 66 | Ht 65.5 in | Wt 170.0 lb

## 2018-12-01 DIAGNOSIS — R079 Chest pain, unspecified: Secondary | ICD-10-CM | POA: Diagnosis not present

## 2018-12-01 DIAGNOSIS — I10 Essential (primary) hypertension: Secondary | ICD-10-CM

## 2018-12-01 DIAGNOSIS — E785 Hyperlipidemia, unspecified: Secondary | ICD-10-CM

## 2018-12-01 DIAGNOSIS — I2510 Atherosclerotic heart disease of native coronary artery without angina pectoris: Secondary | ICD-10-CM

## 2018-12-01 NOTE — Patient Instructions (Signed)
Medication Instructions:  Your physician recommends that you continue on your current medications as directed. Please refer to the Current Medication list given to you today.  If you need a refill on your cardiac medications before your next appointment, please call your pharmacy.   Lab work: Your physician recommends that you return for lab work today: Troponin I.   If you have labs (blood work) drawn today and your tests are completely normal, you will receive your results only by: Marland Kitchen MyChart Message (if you have MyChart) OR . A paper copy in the mail If you have any lab test that is abnormal or we need to change your treatment, we will call you to review the results.  Testing/Procedures: You had an EKG today.   Your physician has requested that you have a lexiscan myoview. For further information please visit [email or web address]. Please follow instruction sheet, as given.  Follow-Up: At [facility], you and your health needs are our priority.  As part of our continuing mission to provide you with exceptional heart care, we have created designated Provider Care Teams.  These Care Teams include your primary Cardiologist (physician) and Advanced Practice Providers (APPs -  Physician Assistants and Nurse Practitioners) who all work together to provide you with the care you need, when you need it. You will need a follow up appointment in 4 weeks.

## 2018-12-02 LAB — TROPONIN I: Troponin I: 0.02 ng/mL (ref 0.00–0.04)

## 2018-12-04 ENCOUNTER — Other Ambulatory Visit: Payer: Self-pay | Admitting: Cardiology

## 2018-12-04 ENCOUNTER — Telehealth: Payer: Self-pay | Admitting: Cardiology

## 2018-12-04 ENCOUNTER — Encounter: Payer: Self-pay | Admitting: *Deleted

## 2018-12-04 DIAGNOSIS — I2510 Atherosclerotic heart disease of native coronary artery without angina pectoris: Secondary | ICD-10-CM | POA: Diagnosis not present

## 2018-12-04 DIAGNOSIS — Z01812 Encounter for preprocedural laboratory examination: Secondary | ICD-10-CM | POA: Diagnosis not present

## 2018-12-04 DIAGNOSIS — I25110 Atherosclerotic heart disease of native coronary artery with unstable angina pectoris: Secondary | ICD-10-CM

## 2018-12-04 NOTE — Telephone Encounter (Signed)
Wants to cancel nuc and get stents

## 2018-12-04 NOTE — Telephone Encounter (Signed)
OK, I do see from Dr. Bettina Gavia note on 11/30/2018. We can schedule the LHC and cancel pharmacologic nuclear stress test. I will call him this morning.

## 2018-12-04 NOTE — Addendum Note (Signed)
Addended by: Particia Nearing B on: 12/04/2018 11:21 AM   Modules accepted: Orders

## 2018-12-04 NOTE — Telephone Encounter (Signed)
Phone call made to patient this am to discuss lab results.  Patient reports "feeling worse over the weekend."  He states that he even cancelled his trip to the mountains this week as he would like to proceed with left heart catheterization. He states that he and Dr Bettina Gavia had discussed him having LHC vs myocardial perfusion, and he decided originally to have myoview.  After this weekend, he would rather proceed with LHC.  Please advise.  Thank you, Elmyra Ricks

## 2018-12-04 NOTE — Telephone Encounter (Signed)
I called patient to understand the nature of his symptoms. He does have a hx of inferior STEMI with PCI and DES of RCA 07/21/18 last seen 12/01/2018. During his visit he complaint of chest pain. Given his history Dr. Bettina Gavia is a pursue ischemic evaluation.  The patient at the time preferred nonivasive myocardial fusion testing.  He was plans to go to the mountains however he says the symptoms persisted and started to feel similar to when he had his inferior STEMI.  I spoke to the patient and given his current symptoms it would be appropriate for him to pursue a left heart catheterization.  We will therefore cancel the pharmacologic stress test.  We will have patient scheduled for Covid testing 3 days prior to his left heart cath.  As well as secure date for his left heart cath.  Will notify patient with the states.  I have educated patient on the testing.  The patient understands that risks include but are not limited to stroke (1 in 1000), death (1 in 86), kidney failure [usually temporary] (1 in 500), bleeding (1 in 200), allergic reaction [possibly serious] (1 in 200), and agrees to proceed.  He was also advised that if symptoms persist and/or worsens to please call 911 to be taken to the nearest emergency department.

## 2018-12-04 NOTE — Telephone Encounter (Signed)
Telephone call to patient. Informed him of cardiac catheterization is 12/08/2018. Be at [facility] short stay at 0830 AM for 1030 procedure with Dr Peter Martinique. Covid 19 screen set up for 12/05/18 at 1030 AM at [facility]. Labs to be drawn today and pre cath instructions printed and placed at front desk for patient pick up.

## 2018-12-05 ENCOUNTER — Other Ambulatory Visit (HOSPITAL_COMMUNITY)
Admission: RE | Admit: 2018-12-05 | Discharge: 2018-12-05 | Disposition: A | Discharge status: Home / Self Care | Payer: Medicare HMO | Source: Ambulatory Visit | Attending: Cardiology | Admitting: Cardiology

## 2018-12-05 DIAGNOSIS — Z20828 Contact with and (suspected) exposure to other viral communicable diseases: Secondary | ICD-10-CM | POA: Diagnosis not present

## 2018-12-05 DIAGNOSIS — Z01812 Encounter for preprocedural laboratory examination: Secondary | ICD-10-CM | POA: Insufficient documentation

## 2018-12-05 LAB — BASIC METABOLIC PANEL
BUN/Creatinine Ratio: 15 (ref 10–24)
BUN: 15 mg/dL (ref 8–27)
CO2: 25 mmol/L (ref 20–29)
Calcium: 9.2 mg/dL (ref 8.6–10.2)
Chloride: 106 mmol/L (ref 96–106)
Creatinine, Ser: 1 mg/dL (ref 0.76–1.27)
GFR calc Af Amer: 86 mL/min/{1.73_m2} (ref >59)
GFR calc non Af Amer: 74 mL/min/{1.73_m2} (ref >59)
Glucose: 147 mg/dL — ABNORMAL HIGH (ref 65–99)
Potassium: 5.1 mmol/L (ref 3.5–5.2)
Sodium: 141 mmol/L (ref 134–144)

## 2018-12-05 LAB — CBC
Hematocrit: 37.8 % (ref 37.5–51.0)
Hemoglobin: 12.8 g/dL — ABNORMAL LOW (ref 13.0–17.7)
MCH: 31.4 pg (ref 26.6–33.0)
MCHC: 33.9 g/dL (ref 31.5–35.7)
MCV: 93 fL (ref 79–97)
Platelets: 230 10*3/uL (ref 150–450)
RBC: 4.08 x10E6/uL — ABNORMAL LOW (ref 4.14–5.80)
RDW: 12.9 % (ref 11.6–15.4)
WBC: 6 10*3/uL (ref 3.4–10.8)

## 2018-12-06 ENCOUNTER — Other Ambulatory Visit (HOSPITAL_COMMUNITY): Payer: Medicare HMO

## 2018-12-06 LAB — NOVEL CORONAVIRUS, NAA (HOSP ORDER, SEND-OUT TO REF LAB; TAT 18-24 HRS): SARS-CoV-2, NAA: NOT DETECTED

## 2018-12-07 ENCOUNTER — Telehealth: Payer: Self-pay | Admitting: *Deleted

## 2018-12-07 NOTE — Telephone Encounter (Addendum)
Pt contacted pre-catheterization scheduled at [facility] for: Friday December 08, 2018 10:30 AM Verified arrival time and place: [facility]) at: 8:30 AM   No solid food after midnight prior to cath, clear liquids until 5 AM day of procedure. Contrast allergy: no   AM meds can be  taken pre-cath with sip of water including: ASA 81 mg Brilinta 90 mg  Confirmed patient has responsible adult to drive home post procedure and observe 24 hours after arriving home: yes  Currently, due to Covid-19 pandemic, only one support person will be allowed with patient. Must be the same support person for that patient's entire stay, will be screened and required to wear a mask. They will be asked to wait in the waiting room for the duration of the patient's stay.  Patients are required to wear a mask when they enter the hospital.      COVID-19 Pre-Screening Questions:  . In the past 7 to 10 days have you had a cough,  shortness of breath, headache, congestion, fever (100 or greater) body aches, chills, sore throat, or sudden loss of taste or sense of smell? no . Have you been around anyone with known Covid 19? no . Have you been around anyone who is awaiting Covid 19 test results in the past 7 to 10 days? no . Have you been around anyone who has been exposed to Covid 19, or has mentioned symptoms of Covid 19 within the past 7 to 10 days? no     I reviewed procedure/mask/visitor, Covid-19 screening questions with patient, he verbalized understanding, thanked me for call.

## 2018-12-08 ENCOUNTER — Ambulatory Visit (HOSPITAL_COMMUNITY)
Admission: RE | Admit: 2018-12-08 | Discharge: 2018-12-08 | Disposition: A | Discharge status: Home / Self Care | Payer: Medicare HMO | Attending: Cardiology | Admitting: Cardiology

## 2018-12-08 ENCOUNTER — Encounter (HOSPITAL_COMMUNITY)
Admission: RE | Disposition: A | Discharge status: Home / Self Care | Payer: Self-pay | Source: Home / Self Care | Attending: Cardiology

## 2018-12-08 ENCOUNTER — Other Ambulatory Visit: Payer: Self-pay

## 2018-12-08 DIAGNOSIS — I209 Angina pectoris, unspecified: Secondary | ICD-10-CM

## 2018-12-08 DIAGNOSIS — Z885 Allergy status to narcotic agent status: Secondary | ICD-10-CM | POA: Diagnosis not present

## 2018-12-08 DIAGNOSIS — I10 Essential (primary) hypertension: Secondary | ICD-10-CM | POA: Diagnosis not present

## 2018-12-08 DIAGNOSIS — Z955 Presence of coronary angioplasty implant and graft: Secondary | ICD-10-CM | POA: Insufficient documentation

## 2018-12-08 DIAGNOSIS — I2510 Atherosclerotic heart disease of native coronary artery without angina pectoris: Secondary | ICD-10-CM | POA: Diagnosis present

## 2018-12-08 DIAGNOSIS — Z888 Allergy status to other drugs, medicaments and biological substances status: Secondary | ICD-10-CM | POA: Insufficient documentation

## 2018-12-08 DIAGNOSIS — Z7982 Long term (current) use of aspirin: Secondary | ICD-10-CM | POA: Insufficient documentation

## 2018-12-08 DIAGNOSIS — G7000 Myasthenia gravis without (acute) exacerbation: Secondary | ICD-10-CM | POA: Diagnosis not present

## 2018-12-08 DIAGNOSIS — Z8249 Family history of ischemic heart disease and other diseases of the circulatory system: Secondary | ICD-10-CM | POA: Insufficient documentation

## 2018-12-08 DIAGNOSIS — I2584 Coronary atherosclerosis due to calcified coronary lesion: Secondary | ICD-10-CM | POA: Insufficient documentation

## 2018-12-08 DIAGNOSIS — I252 Old myocardial infarction: Secondary | ICD-10-CM | POA: Diagnosis not present

## 2018-12-08 DIAGNOSIS — I25110 Atherosclerotic heart disease of native coronary artery with unstable angina pectoris: Secondary | ICD-10-CM

## 2018-12-08 DIAGNOSIS — E785 Hyperlipidemia, unspecified: Secondary | ICD-10-CM | POA: Diagnosis not present

## 2018-12-08 DIAGNOSIS — I25119 Atherosclerotic heart disease of native coronary artery with unspecified angina pectoris: Secondary | ICD-10-CM | POA: Insufficient documentation

## 2018-12-08 DIAGNOSIS — Z79899 Other long term (current) drug therapy: Secondary | ICD-10-CM | POA: Insufficient documentation

## 2018-12-08 HISTORY — DX: Angina pectoris, unspecified: I20.9

## 2018-12-08 HISTORY — PX: LEFT HEART CATH AND CORONARY ANGIOGRAPHY: CATH118249

## 2018-12-08 SURGERY — LEFT HEART CATH AND CORONARY ANGIOGRAPHY
Anesthesia: LOCAL

## 2018-12-08 MED ORDER — HEPARIN SODIUM (PORCINE) 1000 UNIT/ML IJ SOLN
INTRAMUSCULAR | Status: DC | PRN
  Administered 2018-12-08: 4000 [IU] via INTRAVENOUS

## 2018-12-08 MED ORDER — SODIUM CHLORIDE 0.9 % WEIGHT BASED INFUSION: 1.0000 mL/kg/h | INTRAVENOUS | Status: DC

## 2018-12-08 MED ORDER — HYDRALAZINE HCL 20 MG/ML IJ SOLN: 10.0000 mg | INTRAMUSCULAR | Status: DC | PRN

## 2018-12-08 MED ORDER — SODIUM CHLORIDE 0.9% FLUSH: 3.0000 mL | Freq: Two times a day (BID) | INTRAVENOUS | Status: DC

## 2018-12-08 MED ORDER — HEPARIN (PORCINE) IN NACL 1000-0.9 UT/500ML-% IV SOLN
INTRAVENOUS | Status: AC
  Filled 2018-12-08: qty 500

## 2018-12-08 MED ORDER — FENTANYL CITRATE (PF) 100 MCG/2ML IJ SOLN
INTRAMUSCULAR | Status: AC
  Filled 2018-12-08: qty 2

## 2018-12-08 MED ORDER — FENTANYL CITRATE (PF) 100 MCG/2ML IJ SOLN
INTRAMUSCULAR | Status: DC | PRN
  Administered 2018-12-08: 25 ug via INTRAVENOUS

## 2018-12-08 MED ORDER — SODIUM CHLORIDE 0.9 % WEIGHT BASED INFUSION
3.0000 mL/kg/h | INTRAVENOUS | Status: AC
  Administered 2018-12-08: 3 mL/kg/h via INTRAVENOUS

## 2018-12-08 MED ORDER — SODIUM CHLORIDE 0.9 % IV SOLN: 250.0000 mL | INTRAVENOUS | Status: DC | PRN

## 2018-12-08 MED ORDER — HEPARIN (PORCINE) IN NACL 1000-0.9 UT/500ML-% IV SOLN
INTRAVENOUS | Status: AC
  Filled 2018-12-08: qty 1000

## 2018-12-08 MED ORDER — MIDAZOLAM HCL 2 MG/2ML IJ SOLN
INTRAMUSCULAR | Status: AC
  Filled 2018-12-08: qty 2

## 2018-12-08 MED ORDER — HEPARIN SODIUM (PORCINE) 1000 UNIT/ML IJ SOLN
INTRAMUSCULAR | Status: AC
  Filled 2018-12-08: qty 1

## 2018-12-08 MED ORDER — SODIUM CHLORIDE 0.9% FLUSH: 3.0000 mL | INTRAVENOUS | Status: DC | PRN

## 2018-12-08 MED ORDER — VERAPAMIL HCL 2.5 MG/ML IV SOLN
INTRAVENOUS | Status: DC | PRN
  Administered 2018-12-08: 11:00:00 10 mL via INTRA_ARTERIAL

## 2018-12-08 MED ORDER — VERAPAMIL HCL 2.5 MG/ML IV SOLN
INTRAVENOUS | Status: AC
  Filled 2018-12-08: qty 2

## 2018-12-08 MED ORDER — ISOSORBIDE MONONITRATE ER 30 MG PO TB24: 30.0000 mg | ORAL_TABLET | Freq: Every day | ORAL | 11 refills | Status: DC

## 2018-12-08 MED ORDER — ONDANSETRON HCL 4 MG/2ML IJ SOLN: 4.0000 mg | Freq: Four times a day (QID) | INTRAMUSCULAR | Status: DC | PRN

## 2018-12-08 MED ORDER — LIDOCAINE HCL (PF) 1 % IJ SOLN
INTRAMUSCULAR | Status: DC | PRN
  Administered 2018-12-08: 2 mL

## 2018-12-08 MED ORDER — LIDOCAINE HCL (PF) 1 % IJ SOLN
INTRAMUSCULAR | Status: AC
  Filled 2018-12-08: qty 30

## 2018-12-08 MED ORDER — ACETAMINOPHEN 325 MG PO TABS: 650.0000 mg | ORAL_TABLET | ORAL | Status: DC | PRN

## 2018-12-08 MED ORDER — IOHEXOL 350 MG/ML SOLN
INTRAVENOUS | Status: DC | PRN
  Administered 2018-12-08: 80 mL

## 2018-12-08 MED ORDER — HEPARIN (PORCINE) IN NACL 1000-0.9 UT/500ML-% IV SOLN
INTRAVENOUS | Status: DC | PRN
  Administered 2018-12-08 (×3): 500 mL

## 2018-12-08 MED ORDER — MIDAZOLAM HCL 2 MG/2ML IJ SOLN
INTRAMUSCULAR | Status: DC | PRN
  Administered 2018-12-08: 1 mg via INTRAVENOUS

## 2018-12-08 SURGICAL SUPPLY — 10 items
CATH 5FR JL3.5 JR4 ANG PIG MP (CATHETERS) ×2 IMPLANT
DEVICE RAD COMP TR BAND LRG (VASCULAR PRODUCTS) ×2 IMPLANT
GLIDESHEATH SLEND SS 6F .021 (SHEATH) ×2 IMPLANT
GUIDEWIRE INQWIRE 1.5J.035X260 (WIRE) ×1 IMPLANT
INQWIRE 1.5J .035X260CM (WIRE) ×2
KIT HEART LEFT (KITS) ×2 IMPLANT
PACK CARDIAC CATHETERIZATION (CUSTOM PROCEDURE TRAY) ×2 IMPLANT
SYR MEDRAD MARK 7 150ML (SYRINGE) ×2 IMPLANT
TRANSDUCER W/STOPCOCK (MISCELLANEOUS) ×2 IMPLANT
TUBING CIL FLEX 10 FLL-RA (TUBING) ×2 IMPLANT

## 2018-12-08 NOTE — Interval H&P Note (Signed)
History and Physical Interval Note:  12/08/2018 10:25 AM  Adrian Bell  has presented today for surgery, with the diagnosis of chest pain.  The various methods of treatment have been discussed with the patient and family. After consideration of risks, benefits and other options for treatment, the patient has consented to  Procedure(s): LEFT HEART CATH AND CORONARY ANGIOGRAPHY (N/A) as a surgical intervention.  The patient's history has been reviewed, patient examined, no change in status, stable for surgery.  I have reviewed the patient's chart and labs.  Questions were answered to the patient's satisfaction.   Cath Lab Visit (complete for each Cath Lab visit)  Clinical Evaluation Leading to the Procedure:   ACS: No.  Non-ACS:    Anginal Classification: CCS II  Anti-ischemic medical therapy: Minimal Therapy (1 class of medications)  Non-Invasive Test Results: No non-invasive testing performed  Prior CABG: No previous CABG        Adrian Bell [facility] 12/08/2018 10:25 AM

## 2018-12-08 NOTE — Progress Notes (Signed)
Discharge instructions reviewed with patient and wife. Verbalized understanding. 

## 2018-12-08 NOTE — Discharge Instructions (Signed)
Radial Site Care ° °This sheet gives you information about how to care for yourself after your procedure. Your health care provider may also give you more specific instructions. If you have problems or questions, contact your health care provider. °What can I expect after the procedure? °After the procedure, it is common to have: °· Bruising and tenderness at the catheter insertion area. °Follow these instructions at home: °Medicines °· Take over-the-counter and prescription medicines only as told by your health care provider. °Insertion site care °· Follow instructions from your health care provider about how to take care of your insertion site. Make sure you: °? Wash your hands with soap and water before you change your bandage (dressing). If soap and water are not available, use hand sanitizer. °? Change your dressing as told by your health care provider. °? Leave stitches (sutures), skin glue, or adhesive strips in place. These skin closures may need to stay in place for 2 weeks or longer. If adhesive strip edges start to loosen and curl up, you may trim the loose edges. Do not remove adhesive strips completely unless your health care provider tells you to do that. °· Check your insertion site every day for signs of infection. Check for: °? Redness, swelling, or pain. °? Fluid or blood. °? Pus or a bad smell. °? Warmth. °· Do not take baths, swim, or use a hot tub until your health care provider approves. °· You may shower 24-48 hours after the procedure, or as directed by your health care provider. °? Remove the dressing and gently wash the site with plain soap and water. °? Pat the area dry with a clean towel. °? Do not rub the site. That could cause bleeding. °· Do not apply powder or lotion to the site. °Activity ° °· For 24 hours after the procedure, or as directed by your health care provider: °? Do not flex or bend the affected arm. °? Do not push or pull heavy objects with the affected arm. °? Do not  drive yourself home from the hospital or clinic. You may drive 24 hours after the procedure unless your health care provider tells you not to. °? Do not operate machinery or power tools. °· Do not lift anything that is heavier than 10 lb (4.5 kg), or the limit that you are told, until your health care provider says that it is safe. °· Ask your health care provider when it is okay to: °? Return to work or school. °? Resume usual physical activities or sports. °? Resume sexual activity. °General instructions °· If the catheter site starts to bleed, raise your arm and put firm pressure on the site. If the bleeding does not stop, get help right away. This is a medical emergency. °· If you went home on the same day as your procedure, a responsible adult should be with you for the first 24 hours after you arrive home. °· Keep all follow-up visits as told by your health care provider. This is important. °Contact a health care provider if: °· You have a fever. °· You have redness, swelling, or yellow drainage around your insertion site. °Get help right away if: °· You have unusual pain at the radial site. °· The catheter insertion area swells very fast. °· The insertion area is bleeding, and the bleeding does not stop when you hold steady pressure on the area. °· Your arm or hand becomes pale, cool, tingly, or numb. °These symptoms may represent a serious problem   that is an emergency. Do not wait to see if the symptoms will go away. Get medical help right away. Call your local emergency services (911 in the [place] Do not drive yourself to the hospital. °Summary °· After the procedure, it is common to have bruising and tenderness at the site. °· Follow instructions from your health care provider about how to take care of your radial site wound. Check the wound every day for signs of infection. °· Do not lift anything that is heavier than 10 lb (4.5 kg), or the limit that you are told, until your health care provider says  that it is safe. °This information is not intended to replace advice given to you by your health care provider. Make sure you discuss any questions you have with your health care provider. °Document Released: 03/13/2010 Document Revised: 03/16/2017 Document Reviewed: 03/16/2017 °Elsevier Patient Education © 2020 [employer] ° °

## 2018-12-08 NOTE — Interval H&P Note (Signed)
History and Physical Interval Note:  12/08/2018 10:22 AM  Adrian Bell  has presented today for surgery, with the diagnosis of chest pain.  The various methods of treatment have been discussed with the patient and family. After consideration of risks, benefits and other options for treatment, the patient has consented to  Procedure(s): LEFT HEART CATH AND CORONARY ANGIOGRAPHY (N/A) as a surgical intervention.  The patient's history has been reviewed, patient examined, no change in status, stable for surgery.  I have reviewed the patient's chart and labs.  Questions were answered to the patient's satisfaction.     [name] 12/08/2018 10:22 AM Cath Lab Visit (complete for each Cath Lab visit)  Clinical Evaluation Leading to the Procedure:   ACS: No.  Non-ACS:    Anginal Classification: CCS II  Anti-ischemic medical therapy: Minimal Therapy (1 class of medications)  Non-Invasive Test Results: Intermediate-risk stress test findings: cardiac mortality 1-3%/year  Prior CABG: No previous CABG

## 2018-12-11 ENCOUNTER — Encounter (HOSPITAL_COMMUNITY): Payer: Self-pay | Admitting: Cardiology

## 2018-12-25 ENCOUNTER — Encounter: Payer: Self-pay | Admitting: Neurology

## 2018-12-25 ENCOUNTER — Telehealth (INDEPENDENT_AMBULATORY_CARE_PROVIDER_SITE_OTHER): Payer: Medicare HMO | Admitting: Neurology

## 2018-12-25 ENCOUNTER — Other Ambulatory Visit: Payer: Self-pay

## 2018-12-25 VITALS — BP 131/66 | HR 54 | Ht 66.0 in | Wt 173.0 lb

## 2018-12-25 DIAGNOSIS — G7000 Myasthenia gravis without (acute) exacerbation: Secondary | ICD-10-CM

## 2018-12-25 MED ORDER — PREDNISONE 5 MG PO TABS: 5.0000 mg | ORAL_TABLET | Freq: Every day | ORAL | 3 refills | Status: DC

## 2018-12-25 NOTE — Progress Notes (Signed)
    Virtual Visit via Telephone Note The purpose of this virtual visit is to provide medical care while limiting exposure to the novel coronavirus.    Consent was obtained for phone visit:  Yes.   Answered questions that patient had about telehealth interaction:  Yes.   I discussed the limitations, risks, security and privacy concerns of performing an evaluation and management service by telephone. I also discussed with the patient that there may be a patient responsible charge related to this service. The patient expressed understanding and agreed to proceed.  Pt location: Home Physician Location: office Name of referring provider:  Jobos connected with .Lindalou Hose at patients initiation/request on 12/25/2018 at 10:30 AM EST by telephone and verified that I am speaking with the correct person using two identifiers.  Pt MRN:  [id number] Pt DOB:  [birth year]   History of Present Illness:   This is a 73 year old man returning for follow-up of seropositive myasthenia gravis.  He remains on prednisone 5 mg and Mestinon 60 mg twice daily and is doing well.  He denies any double vision, ptosis, or weakness.     Observations/Objective:   Today's Vitals   12/25/18 1014  BP: 131/66  Pulse: (!) 54  Weight: 173 lb (78.5 kg)  Height: 5\' 6"  (1.676 m)   Body mass index is 27.92 kg/m.   Speech is clear, no dysarthria.     Assessment and Plan:   Seropositive bulbar myasthenia gravis without exacerbation (March 2019).  Clinically doing well with no signs of weakness.  In the past, he developed exacerbation when prednisone was tapered to 2.5 mg daily.  After much discussion, it was mutually agreed upon to keep him on prednisone 5 mg daily and Mestinon 60 mg twice daily.   Follow Up Instructions:   I discussed the assessment and treatment plan with the patient. The patient was provided an opportunity to ask questions and all were answered. The patient agreed with the plan and  demonstrated an understanding of the instructions.   The patient was advised to call back or seek an in-person evaluation if the symptoms worsen or if the condition fails to improve as anticipated.  Return to clinic in 4 months  Total Time spent in visit with the patient was:  10 min, of which 100% of the time was spent in counseling and/or coordinating care.   Pt understands and agrees with the plan of care outlined.     Alda Berthold, DO

## 2018-12-26 ENCOUNTER — Telehealth: Payer: Self-pay | Admitting: Cardiology

## 2018-12-26 NOTE — Telephone Encounter (Signed)
Has questions about getting his medication from the [place]

## 2018-12-27 MED ORDER — ISOSORBIDE MONONITRATE ER 30 MG PO TB24: 30.0000 mg | ORAL_TABLET | Freq: Every day | ORAL | 3 refills | Status: DC

## 2018-12-27 NOTE — Addendum Note (Signed)
Addended by: Austin Miles on: 12/27/2018 12:16 PM   Modules accepted: Orders

## 2018-12-27 NOTE — Telephone Encounter (Signed)
Spoke with patient who is requesting a refill for isosorbide mononitrate to go to the [place] in [place] for refill. Will send printed prescription to Dr. Rex Kras office at [phone] as requested once Dr. Bettina Gavia signs it. Patient is agreeable and verbalized understanding. No further questions.

## 2019-01-10 ENCOUNTER — Other Ambulatory Visit: Payer: Self-pay

## 2019-01-10 ENCOUNTER — Encounter: Payer: Self-pay | Admitting: Cardiology

## 2019-01-10 ENCOUNTER — Telehealth (INDEPENDENT_AMBULATORY_CARE_PROVIDER_SITE_OTHER): Payer: Medicare HMO | Admitting: Cardiology

## 2019-01-10 VITALS — BP 127/58 | HR 54 | Ht 66.0 in | Wt 169.0 lb

## 2019-01-10 DIAGNOSIS — E782 Mixed hyperlipidemia: Secondary | ICD-10-CM

## 2019-01-10 DIAGNOSIS — I10 Essential (primary) hypertension: Secondary | ICD-10-CM

## 2019-01-10 DIAGNOSIS — I2510 Atherosclerotic heart disease of native coronary artery without angina pectoris: Secondary | ICD-10-CM

## 2019-01-10 NOTE — Patient Instructions (Signed)
Medication Instructions:  Your physician recommends that you continue on your current medications as directed. Please refer to the Current Medication list given to you today.  *If you need a refill on your cardiac medications before your next appointment, please call your pharmacy*  Lab Work: None  If you have labs (blood work) drawn today and your tests are completely normal, you will receive your results only by: . MyChart Message (if you have MyChart) OR . A paper copy in the mail If you have any lab test that is abnormal or we need to change your treatment, we will call you to review the results.  Testing/Procedures: None  Follow-Up: At [facility], you and your health needs are our priority.  As part of our continuing mission to provide you with exceptional heart care, we have created designated Provider Care Teams.  These Care Teams include your primary Cardiologist (physician) and Advanced Practice Providers (APPs -  Physician Assistants and Nurse Practitioners) who all work together to provide you with the care you need, when you need it.  Your next appointment:   6 month(s)  The format for your next appointment:   In Person  Provider:   Brian Munley, MD   

## 2019-01-10 NOTE — Progress Notes (Signed)
Virtual Visit via Video Note   This visit type was conducted due to national recommendations for restrictions regarding the COVID-19 Pandemic (e.g. social distancing) in an effort to limit this patient's exposure and mitigate transmission in our community.  Due to his co-morbid illnesses, this patient is at least at moderate risk for complications without adequate follow up.  This format is felt to be most appropriate for this patient at this time.  All issues noted in this document were discussed and addressed.  A limited physical exam was performed with this format.  Please refer to the patient's chart for his consent to telehealth for [facility].  Date:  01/10/2019   ID:  Adrian Bell, DOB 1945/12/21, MRN [id number]  PCP:  [facility]  Cardiologist:  Norman Herrlich, MD    Referring MD: [facility]    ASSESSMENT:    1. CAD in native artery   2. Essential hypertension   3. Mixed hyperlipidemia    PLAN:    In order of problems listed above:  1. Stable CAD presently having no angina on current medical treatment New York Heart Association class I we will continue therapy including aspirin oral nitrate beta-blocker Brilinta and his statin.  We will plan to transition to clopidogrel from Brilinta at the 1 year anniversary 07/21/2019 2. Stable BP at target continue treatment including ACE inhibitor with CAD 3. Stable tolerates his statin well and lipids are ideal continue Lipitor   Next appointment: 6 months   Medication Adjustments/Labs and Tests Ordered: Current medicines are reviewed at length with the patient today.  Concerns regarding medicines are outlined above.  No orders of the defined types were placed in this encounter.  No orders of the defined types were placed in this encounter.   Followup for CAD  History of Present Illness:    Adrian Bell is a 73 y.o. male with a hx of  inferior STEMI with PCI and DES of RCA 07/21/18 last seen 12/01/2018  with recurrent angina.. Compliance with diet, lifestyle and medications: Yes  I reviewed his coronary angiogram with the patient. He discussed his recent labs from the [place] hospital with me CBC and CMP were normal.  Hemoglobin A1c 6.0% at target cholesterol is ideal with total 143 HDL 63 LDL 59 triglycerides 104 in the last 2 weeks at the [facility] hospital.  LEFT HEART CATH AND CORONARY ANGIOGRAPHY  Conclusion    Ost LAD to Prox LAD lesion is 75% stenosed.  Mid LAD to Dist LAD lesion is 75% stenosed.  Ost 1st Mrg lesion is 70% stenosed.  Ost RCA to Prox RCA lesion is 70% stenosed.  Previously placed Mid RCA drug eluting stent is widely patent.  Ost 1st Diag lesion is 100% stenosed.  Ost 2nd Diag to 2nd Diag lesion is 100% stenosed.  Ost Cx to Prox Cx lesion is 50% stenosed.  Prox Cx to Mid Cx lesion is 50% stenosed.  Dist RCA lesion is 80% stenosed with 80% stenosed side branch in Ost RPDA.  The left ventricular systolic function is normal.  LV end diastolic pressure is normal.  The left ventricular ejection fraction is 55-65% by visual estimate.   1. Complex 3 vessel CAD. He has severely calcified coronary arteries.    - 70% ostial LAD with bulky, eccentric lesion    - segmental 75% mid LAD    - occluded small first and second diagonals    - 50% ostial and mid  LCx    - 70% ostial large OM1    - 70% ostial RCA. 80% distal RCA at bifurcation 2. Normal LV function.  3. Normal LVEDP  Diagnostic Dominance: Right   Diagnostic Dominance: Right Plan; will intensify medical therapy for angina adding Imdur 30 mg daily. If he continues to have angina I would recommend CT surgery consult for consideration of CABG   Unfortunately has no ability to do a video visit.  He checked his blood pressure and heart rate at home he has good healthcare literacy and is quite pleased with the quality of his life.  Presently is New York Heart Association class I is having no anginal discomfort  on his current medical treatment.  He walks for 20 to 30 minutes each morning.  I cautioned him to delay that for about an hour after taking his oral nitrate to allow it to be effective and after discussion he understands that he may need to take sublingual nitroglycerin at times.  He is having no side effects from his medications and no muscle symptoms from his statin.  His myasthenia gravis is stable.  He has had no chest pain edema shortness of breath orthopnea palpitation or syncope. Past Medical History:  Diagnosis Date  . Dysphagia   . Hyperlipidemia   . Kidney stones   . Myasthenia gravis ([place])   . STEMI (ST elevation myocardial infarction) [facility])     Past Surgical History:  Procedure Laterality Date  . BACK SURGERY    . CARDIAC CATHETERIZATION    . CORONARY/GRAFT ACUTE MI REVASCULARIZATION N/A 07/21/2018   Procedure: Coronary/Graft Acute MI Revascularization;  Surgeon: [name], MD;  Location: [place] [facility];  Service: Cardiovascular;  Laterality: N/A;  . CYSTOSCOPY WITH INSERTION OF UROLIFT  2018  . LASIK    . LEFT HEART CATH AND CORONARY ANGIOGRAPHY N/A 07/21/2018   Procedure: LEFT HEART CATH AND CORONARY ANGIOGRAPHY;  Surgeon: [name], MD;  Location: [place] [facility];  Service: Cardiovascular;  Laterality: N/A;  . LEFT HEART CATH AND CORONARY ANGIOGRAPHY N/A 12/08/2018   Procedure: LEFT HEART CATH AND CORONARY ANGIOGRAPHY;  Surgeon: Martinique, Peter M, MD;  Location: [place] [facility];  Service: Cardiovascular;  Laterality: N/A;  . LITHOTRIPSY    . NECK SURGERY    . TONSILLECTOMY      Current Medications: Current Meds  Medication Sig  . aspirin 81 MG chewable tablet Chew 1 tablet (81 mg total) by mouth daily.  . isosorbide mononitrate (IMDUR) 30 MG 24 hr tablet Take 1 tablet (30 mg total) by mouth daily.  . metoprolol succinate (TOPROL-XL) 25 MG 24 hr tablet Take 0.5 tablets (12.5 mg total) by mouth daily.  . mirabegron ER (MYRBETRIQ) 25 MG TB24  tablet Take 25 mg by mouth daily.  . nitroGLYCERIN (NITROSTAT) 0.4 MG SL tablet Place 1 tablet (0.4 mg total) under the tongue every 5 (five) minutes as needed.  . NON FORMULARY Take 1 tablet by mouth every 3 (three) days. Rox-OTC medication for erectile dysfunction  . Pitavastatin Calcium (LIVALO) 2 MG TABS Take 1 tablet (2 mg total) by mouth daily.  . predniSONE (DELTASONE) 5 MG tablet Take 1 tablet (5 mg total) by mouth daily.  Marland Kitchen pyridostigmine (MESTINON) 60 MG tablet Take 1 tablet (60 mg total) by mouth 2 (two) times a day.  . ramipril (ALTACE) 5 MG capsule Take 5 mg by mouth daily.   . ticagrelor (BRILINTA) 90 MG TABS tablet Take 1 tablet (  90 mg total) by mouth 2 (two) times daily.     Allergies:   Zetia [ezetimibe], Demerol [meperidine hcl], and Statins   Social History   Socioeconomic History  . Marital status: Married    Spouse name: Not on file  . Number of children: 3  . Years of education: 70  . Highest education level: Not on file  Occupational History  . 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 Financial resource strain: Not on file  . Food insecurity    Worry: Not on file    Inability: Not on file  . Transportation needs    Medical: Not on file    Non-medical: Not on file  Tobacco Use  . Smoking status: Never Smoker  . Smokeless tobacco: Former Neurosurgeon    Types: Chew  Substance and Sexual Activity  . Alcohol use: Yes    Frequency: Never    Comment: occ   . Drug use: No  . Sexual activity: Not on file  Lifestyle  . Physical activity    Days per week: Not on file    Minutes per session: Not on file  . Stress: Not on file  Relationships  . Social Musician on phone: Not on file    Gets together: Not on file    Attends religious service: Not on file    Active member of club or organization: Not on file    Attends meetings of clubs or organizations: Not on file    Relationship status: Not on file  Other Topics Concern  . Not  on file  Social History Narrative   Lives with wife in a one story home.  Has 3 children.     Retired Public librarian.     Education: some college.    Right handed     Family History: The patient's family history includes Deep vein thrombosis in his brother; Diabetes in his sister; Esophageal cancer in his brother; Heart attack in his father and mother; Heart disease in his mother; Stroke in his brother and sister. ROS:   Please see the history of present illness.    All other systems reviewed and are negative.  EKGs/Labs/Other Studies Reviewed:    The following studies were reviewed today:  EKG:  EKG ordered today and personally reviewed.  The ekg ordered today demonstrates SRTH RBBB and old inferior MI  Recent Labs: 07/21/2018: ALT 15 12/04/2018: BUN 15; Creatinine, Ser 1.00; Hemoglobin 12.8; Platelets 230; Potassium 5.1; Sodium 141  Recent Lipid Panel    Component Value Date/Time   CHOL 180 07/21/2018 2254   TRIG 127 07/21/2018 2254   HDL 59 07/21/2018 2254   CHOLHDL 3.1 07/21/2018 2254   VLDL 25 07/21/2018 2254   LDLCALC 96 07/21/2018 2254    Physical Exam:    VS:  BP (!) 127/58   Pulse (!) 54   Ht 5\' 6"  (1.676 m)   Wt 169 lb (76.7 kg)   BMI 27.28 kg/m     Wt Readings from Last 3 Encounters:  01/10/19 169 lb (76.7 kg)  12/25/18 173 lb (78.5 kg)  12/08/18 170 lb (77.1 kg)    Vital signs reviewed No audible wheezing or respiratory distress  Signed, 12/10/18, MD  01/10/2019 11:03 AM    [facility]

## 2019-01-22 ENCOUNTER — Telehealth: Payer: Self-pay | Admitting: Cardiology

## 2019-01-22 NOTE — Telephone Encounter (Signed)
Please advise. Thanks.  

## 2019-01-22 NOTE — Telephone Encounter (Signed)
Please call patient regarding his Isosorbide and since he has started taking he is having a lot of dizziness. Please call him.Marland Kitchen

## 2019-01-22 NOTE — Telephone Encounter (Signed)
I would like him to stop taking the isosorbide for the time being, if he is having angina frequently we could restart it.

## 2019-01-23 NOTE — Telephone Encounter (Signed)
Attempted to contact patient with no answer and unable to leave message. Will continue efforts.  

## 2019-01-24 MED ORDER — ISOSORBIDE MONONITRATE ER 30 MG PO TB24: 15.0000 mg | ORAL_TABLET | Freq: Every day | ORAL | 3 refills | Status: DC

## 2019-01-24 NOTE — Addendum Note (Signed)
Addended by: Austin Miles on: 01/24/2019 11:50 AM   Modules accepted: Orders

## 2019-01-24 NOTE — Telephone Encounter (Signed)
Spoke with patient regarding isosorbide mononitrate. Patient states the medication is doing a great job of treating his chronic chest pain but he has been experiencing intermittent dizziness since starting this medication. Patient reports his blood pressure has been within normal limits.   Updated Dr. Bettina Gavia on patient's status and he advised for patient to decrease isosorbide mononitrate from 30 mg to 15 mg daily. Patient is agreeable and will see if this change improves dizziness. He will notify our office with any further questions or concerns. Patient verbalized understanding.

## 2019-02-02 ENCOUNTER — Encounter: Payer: Self-pay | Admitting: Gastroenterology

## 2019-03-08 ENCOUNTER — Telehealth: Payer: Self-pay | Admitting: Cardiology

## 2019-03-08 MED ORDER — RANOLAZINE ER 500 MG PO TB12: 500.0000 mg | ORAL_TABLET | Freq: Two times a day (BID) | ORAL | 2 refills | Status: DC

## 2019-03-08 NOTE — Addendum Note (Signed)
Addended by: Dossie Arbour on: 03/08/2019 12:40 PM   Modules accepted: Orders

## 2019-03-08 NOTE — Telephone Encounter (Signed)
Is still having burning in his chest and wants something as good as isosorbide but that won't make him dizzy

## 2019-03-08 NOTE — Telephone Encounter (Signed)
I spoke with patient. Imdur was decreased from 30 mg daily to 15 mg daily due to dizziness.  Patient reports very little dizziness on 15 mg dose. He recently started having episodes of chest burning again. Not as bad as before he started Imdur.   Had episode yesterday and today. Today he took one NTG with relief. He states the 30 mg dose worked well controlling burning.  He is asking if he went back to this dose if there was something he could take to help with dizziness or if there is a medication he could take in place of Imdur.

## 2019-03-08 NOTE — Telephone Encounter (Signed)
Yes virtual is good

## 2019-03-08 NOTE — Telephone Encounter (Signed)
Lets initiate him on ranolazine 500 mgs twice daily I would like to see him in the office in 1 to 2 weeks and I suspect he will have a good response by that time continue his low-dose Imdur.

## 2019-03-08 NOTE — Telephone Encounter (Signed)
I spoke with pt and gave him information from Dr Dulce Sellar.  Will send prescription to [facility] on [address] in [place]. If he ends up being on this long term patient would like prescription sent to [place].  He would prefer virtual visit for follow up.  Will send to Dr Dulce Sellar to see if this is OK.  Will then ask scheduling team to contact patient to arrange appointment.

## 2019-03-22 ENCOUNTER — Other Ambulatory Visit: Payer: Self-pay | Admitting: Cardiology

## 2019-03-22 NOTE — Telephone Encounter (Signed)
*  STAT* If patient is at the pharmacy, call can be transferred to refill team.   1. Which medications need to be refilled? (please list name of each medication and dose if known) Ranol;axine 500mg   2. Which pharmacy/location (including street and city if local pharmacy) is medication to be sent to? Call to the [facility]   3. Do they need a 30 day or 90 day supply? 90   FAX to the [facility] [phone]

## 2019-03-23 ENCOUNTER — Other Ambulatory Visit: Payer: Self-pay

## 2019-03-23 MED ORDER — RANOLAZINE ER 500 MG PO TB12: 500.0000 mg | ORAL_TABLET | Freq: Two times a day (BID) | ORAL | 2 refills | Status: DC

## 2019-03-23 NOTE — Progress Notes (Signed)
Resent Ranexa RX to [facility] per patients request.

## 2019-03-23 NOTE — Telephone Encounter (Signed)
Resent rx to [facility] per patients request .

## 2019-03-24 NOTE — Progress Notes (Signed)
Virtual Visit via Telephone Note   This visit type was conducted due to national recommendations for restrictions regarding the COVID-19 Pandemic (e.g. social distancing) in an effort to limit this patient's exposure and mitigate transmission in our community.  Due to his co-morbid illnesses, this patient is at least at moderate risk for complications without adequate follow up.  This format is felt to be most appropriate for this patient at this time.  The patient did not have access to video technology/had technical difficulties with video requiring transitioning to audio format only (telephone).  All issues noted in this document were discussed and addressed.  No physical exam could be performed with this format.  Please refer to the patient's chart for his  consent to telehealth for [facility].   Date:  03/24/2019   ID:  Adrian Bell, DOB [birth year], MRN [id number]  Patient Location: Home Provider Location: Office  PCP:  [facility]  Cardiologist:  Norman Herrlich, MD  Electrophysiologist:  None   Evaluation Performed:  Follow-Up Visit  Chief Complaint:  CAD  History of Present Illness:    Adrian Bell is a 74 y.o. male with  inferior STEMI with PCI and DES of RCA 07/21/18 last seen 01/10/2019 after coronary angiography.  The patient does not have symptoms concerning for COVID-19 infection (fever, chills, cough, or new shortness of breath).   LEFT HEART CATH AND CORONARY ANGIOGRAPHY  Conclusion      Ost LAD to Prox LAD lesion is 75% stenosed.  Mid LAD to Dist LAD lesion is 75% stenosed.  Ost 1st Mrg lesion is 70% stenosed.  Ost RCA to Prox RCA lesion is 70% stenosed.  Previously placed Mid RCA drug eluting stent is widely patent.  Ost 1st Diag lesion is 100% stenosed.  Ost 2nd Diag to 2nd Diag lesion is 100% stenosed.  Ost Cx to Prox Cx lesion is 50% stenosed.  Prox Cx to Mid Cx lesion is 50% stenosed.  Dist RCA lesion is 80% stenosed with 80% stenosed side  branch in Ost RPDA.  The left ventricular systolic function is normal.  LV end diastolic pressure is normal.  The left ventricular ejection fraction is 55-65% by visual estimate.   1. Complex 3 vessel CAD. He has severely calcified coronary arteries.    - 70% ostial LAD with bulky, eccentric lesion    - segmental 75% mid LAD    - occluded small first and second diagonals    - 50% ostial and mid LCx    - 70% ostial large OM1    - 70% ostial RCA. 80% distal RCA at bifurcation 2. Normal LV function.  3. Normal LVEDP   Diagnostic Dominance: Right     He is improved his previous lightheadedness with higher dose Imdur has resolved he is doing his walking program has had no recurrent angina and tolerates ranolazine.  He has had no adverse muscle symptoms with his myasthenia gravis muscle pain or weakness and tolerates dual antiplatelet therapy without abdominal discomfort or bleeding.  He has had no angina edema shortness of breath palpitation or syncope  Past Medical History:  Diagnosis Date  . Dysphagia   . Hyperlipidemia   . Kidney stones   . Myasthenia gravis (HCC)   . STEMI (ST elevation myocardial infarction) [facility])    Past Surgical History:  Procedure Laterality Date  . BACK SURGERY    . CARDIAC CATHETERIZATION    . CORONARY/GRAFT ACUTE MI REVASCULARIZATION N/A 07/21/2018   Procedure: Coronary/Graft Acute  MI Revascularization;  Surgeon: [name], MD;  Location: [place] [facility];  Service: Cardiovascular;  Laterality: N/A;  . CYSTOSCOPY WITH INSERTION OF UROLIFT  2018  . LASIK    . LEFT HEART CATH AND CORONARY ANGIOGRAPHY N/A 07/21/2018   Procedure: LEFT HEART CATH AND CORONARY ANGIOGRAPHY;  Surgeon: [name], MD;  Location: [place] [facility];  Service: Cardiovascular;  Laterality: N/A;  . LEFT HEART CATH AND CORONARY ANGIOGRAPHY N/A 12/08/2018   Procedure: LEFT HEART CATH AND CORONARY ANGIOGRAPHY;  Surgeon: Martinique, Peter M, MD;  Location: [place]  [facility];  Service: Cardiovascular;  Laterality: N/A;  . LITHOTRIPSY    . NECK SURGERY    . TONSILLECTOMY       No outpatient medications have been marked as taking for the 03/26/19 encounter (Appointment) with Richardo Priest, MD.     Allergies:   Zetia [ezetimibe], Demerol [meperidine hcl], and Statins   Social History   Tobacco Use  . Smoking status: Never Smoker  . Smokeless tobacco: Former Systems developer    Types: Chew  Substance Use Topics  . Alcohol use: Yes    Comment: occ   . Drug use: No     Family Hx: The patient's family history includes Deep vein thrombosis in his brother; Diabetes in his sister; Esophageal cancer in his brother; Heart attack in his father and mother; Heart disease in his mother; Stroke in his brother and sister.  ROS:   Please see the history of present illness.     All other systems reviewed and are negative.   Prior CV studies:   The following studies were reviewed today:    Labs/Other Tests and Data Reviewed:    EKG:  An ECG dated 12/01/2018 was personally reviewed today and demonstrated:  Sinus rhythm right bundle branch block  Recent Labs: 07/21/2018: ALT 15 12/04/2018: BUN 15; Creatinine, Ser 1.00; Hemoglobin 12.8; Platelets 230; Potassium 5.1; Sodium 141   Recent Lipid Panel Lab Results  Component Value Date/Time   CHOL 180 07/21/2018 10:54 PM   TRIG 127 07/21/2018 10:54 PM   HDL 59 07/21/2018 10:54 PM   CHOLHDL 3.1 07/21/2018 10:54 PM   LDLCALC 96 07/21/2018 10:54 PM    Wt Readings from Last 3 Encounters:  01/10/19 169 lb (76.7 kg)  12/25/18 173 lb (78.5 kg)  12/08/18 170 lb (77.1 kg)     Objective:    Vital Signs:  There were no vitals taken for this visit.   VITAL SIGNS:  reviewed  ASSESSMENT & PLAN:    1. CAD, improved New York Heart Association class I purpose of visit is to optimize medical therapy he tolerates reduced dose oral nitrate and his ranolazine will continue his current treatment including dual antiplatelet  therapy.  Plan for office follow-up in 6 months. 2. Hypertension stable continue current treatment beta-blocker 3. Hyperlipidemia, stable continue his low intensity statin with myasthenia gravis, lipids are at target LDL less than 100  COVID-19 Education: The signs and symptoms of COVID-19 were discussed with the patient and how to seek care for testing (follow up with PCP or arrange E-visit).  The importance of social distancing was discussed today.  Time:   Today, I have spent 15 minutes with the patient with telehealth technology discussing the above problems.     Medication Adjustments/Labs and Tests Ordered: Current medicines are reviewed at length with the patient today.  Concerns regarding medicines are outlined above.   Tests Ordered: No orders of  the defined types were placed in this encounter.   Medication Changes: No orders of the defined types were placed in this encounter.   Follow Up:  In Person in 6 month(s)  Signed, Norman Herrlich, MD  03/24/2019 3:33 PM    [facility]

## 2019-03-26 ENCOUNTER — Telehealth (INDEPENDENT_AMBULATORY_CARE_PROVIDER_SITE_OTHER): Payer: Medicare HMO | Admitting: Cardiology

## 2019-03-26 ENCOUNTER — Encounter: Payer: Self-pay | Admitting: Cardiology

## 2019-03-26 ENCOUNTER — Other Ambulatory Visit: Payer: Self-pay | Admitting: Cardiology

## 2019-03-26 ENCOUNTER — Other Ambulatory Visit: Payer: Self-pay

## 2019-03-26 VITALS — BP 136/64 | HR 55 | Wt 174.0 lb

## 2019-03-26 DIAGNOSIS — I10 Essential (primary) hypertension: Secondary | ICD-10-CM | POA: Diagnosis not present

## 2019-03-26 DIAGNOSIS — I2510 Atherosclerotic heart disease of native coronary artery without angina pectoris: Secondary | ICD-10-CM

## 2019-03-26 DIAGNOSIS — E782 Mixed hyperlipidemia: Secondary | ICD-10-CM

## 2019-03-26 NOTE — Patient Instructions (Signed)
Medication Instructions:  Your physician recommends that you continue on your current medications as directed. Please refer to the Current Medication list given to you today.  *If you need a refill on your cardiac medications before your next appointment, please call your pharmacy*  Lab Work: NONE If you have labs (blood work) drawn today and your tests are completely normal, you will receive your results only by: . MyChart Message (if you have MyChart) OR . A paper copy in the mail If you have any lab test that is abnormal or we need to change your treatment, we will call you to review the results.  Testing/Procedures: NONE  Follow-Up: At [facility], you and your health needs are our priority.  As part of our continuing mission to provide you with exceptional heart care, we have created designated Provider Care Teams.  These Care Teams include your primary Cardiologist (physician) and Advanced Practice Providers (APPs -  Physician Assistants and Nurse Practitioners) who all work together to provide you with the care you need, when you need it.  Your next appointment:   6 month(s)  The format for your next appointment:   In Person  Provider:   Brian Munley, MD    

## 2019-03-26 NOTE — Telephone Encounter (Signed)
Adrian Bell from [facility] is calling stating they have not received the fax regarding   ranolazine (RANEXA) 500 MG 12 hr tablet  She states to try faxing it to: [phone] with Attention Dr. Channing Mutters

## 2019-03-26 NOTE — Telephone Encounter (Signed)
*  STAT* If patient is at the pharmacy, call can be transferred to refill team.   1. Which medications need to be refilled? (please list name of each medication and dose if known) ranolazine (RANEXA) 500 MG 12 hr tablet  2. Which pharmacy/location (including street and city if local pharmacy) is medication to be sent to? [facility] - [place], [place] - [address]  3. Do they need a 30 day or 90 day supply? 90  Patient states that the [facility] never received the medication refill request. New fax number: [phone] with Attention to Amy. Please contact patient once prescription has been filled.

## 2019-03-27 NOTE — Telephone Encounter (Signed)
Called [facility] and spoke with Caryn Bee he said he doesn't know anyone named Amy and that the fax number the patient provided is wrong. Confirmed correct fax is the one listed in the chart. He said that the patient must follow their process and wait for the prescription to be received and processed per their policy. He would not take a verbal refill over the phone as I explained the patient mentioned they do not have our request when we have sent it multiple times and received confirmation. He apologized and repeated that the patient must wait for them to process the prescription and that he can call and check on it if he wishes. I thanked him and told him I would let the patient know.   Ranolazine 500 MG

## 2019-03-27 NOTE — Telephone Encounter (Signed)
Called patient to inform him of my call with the [facility]. His number isn't currently working and I was unable to leave a message.

## 2019-04-06 ENCOUNTER — Telehealth (INDEPENDENT_AMBULATORY_CARE_PROVIDER_SITE_OTHER): Payer: Medicare HMO | Admitting: Neurology

## 2019-04-06 ENCOUNTER — Encounter: Payer: Self-pay | Admitting: Neurology

## 2019-04-06 ENCOUNTER — Other Ambulatory Visit: Payer: Self-pay

## 2019-04-06 VITALS — Ht 66.0 in | Wt 174.0 lb

## 2019-04-06 DIAGNOSIS — G7000 Myasthenia gravis without (acute) exacerbation: Secondary | ICD-10-CM

## 2019-04-06 NOTE — Progress Notes (Signed)
    Virtual Visit via Telephone Note The purpose of this virtual visit is to provide medical care while limiting exposure to the novel coronavirus.    Consent was obtained for phone visit:  Yes.   Answered questions that patient had about telehealth interaction:  Yes.   I discussed the limitations, risks, security and privacy concerns of performing an evaluation and management service by telephone. I also discussed with the patient that there may be a patient responsible charge related to this service. The patient expressed understanding and agreed to proceed.  Pt location: Home Physician Location: office Name of referring provider:  [facility] I connected with .[name] at patients initiation/request on 04/06/2019 at  2:30 PM EST by telephone and verified that I am speaking with the correct person using two identifiers.  Pt MRN:  [id number] Pt DOB:  01-07-1946   History of Present Illness: This is a 74 year old man returning for follow-up of seropositive myasthenia gravis.  He reports doing very well on prednisone 5 mg and Mestinon 60 mg twice daily.  Very rarely, he reports trace difficulty closing his eyes when he is washing his face in the shower, which only lasts the duration of time when his eyes are closed.  This occurs about once per week and is very transient.  He denies any double vision, ptosis, difficulty swallowing/talking, or weakness.   He has received his first Covid vaccination and tolerated well.   Observations/Objective:   Today's Vitals   04/06/19 1153  Weight: 174 lb (78.9 kg)  Height: 5\' 6"  (1.676 m)   Body mass index is 28.08 kg/m.   Speech is clear, no dysarthria.     Assessment and Plan:   Seropositive bulbar myasthenia gravis without exacerbation (March 2019).  Clinically, he has been doing very well and was on a tapering dose of prednisone down to 2.5 mg daily, however developed some visual symptoms which resolved with prednisone 5 mg daily.  The  highest dose of prednisone he required was 30mg /d with initial diagnosis.  He has never been hospitalized with myasthenia. I will keep him on prednisone 5 mg daily and Mestinon 60 mg twice daily. If he develops any weakness during the warmer months, prednisone can be increased to 10-20mg mg daily or higher based on the severity of symptoms.    Follow Up Instructions:   I discussed the assessment and treatment plan with the patient. The patient was provided an opportunity to ask questions and all were answered. The patient agreed with the plan and demonstrated an understanding of the instructions.   The patient was advised to call back or seek an in-person evaluation if the symptoms worsen or if the condition fails to improve as anticipated.  Return to clinic in 6 months  Total Time spent in visit with the patient was:  12 min, of which 100% of the time was spent in counseling and/or coordinating care.   Pt understands and agrees with the plan of care outlined.     07-10-1983, DO

## 2019-09-07 ENCOUNTER — Telehealth: Payer: Self-pay | Admitting: Cardiology

## 2019-09-07 NOTE — Telephone Encounter (Signed)
Received a call from [employer] with [facility] [place] and she said that they need a clinical documentation stating why the patient needs to stay on ticagrelor (BRILINTA) 90 MG TABS tablet in order for the pt to contiune to take the medication. Please fax to [phone], attn to Dr. Channing Mutters.

## 2019-09-07 NOTE — Telephone Encounter (Signed)
Records faxed at this time

## 2019-09-10 NOTE — Telephone Encounter (Signed)
Attempted returning Amy Kirks call from the [place] regarding patient medications. Nn answer & unable to leave a message at this time.

## 2019-09-10 NOTE — Telephone Encounter (Signed)
Amy returning call. She states she is in the office until 4:30 pm.

## 2019-09-10 NOTE — Telephone Encounter (Signed)
Tried calling Adrian Bell back. No answer and no voicemail set up for me to leave a message.

## 2019-09-10 NOTE — Telephone Encounter (Signed)
He is overdue for follow-up as PCI and stent was May 2020 he can stop Brilinta but needs to remain on aspirin should be scheduled with me for office follow-up.

## 2019-09-10 NOTE — Telephone Encounter (Signed)
Pt c/o medication issue:  1. Name of Medication: ticagrelor (BRILINTA) 90 MG TABS tablet   2. How are you currently taking this medication (dosage and times per day)? As directed  3. Are you having a reaction (difficulty breathing--STAT)? No  4. What is your medication issue? Amy from [facility] is calling. She state that since it has been so long since Dr. Dulce Sellar has seen the patient, Dr. Channing Mutters wants to know if the patient should continue on Brilinta or not. If so, they need documentation faxed to their office stating the reason why. Fax number [phone].

## 2019-09-11 NOTE — Telephone Encounter (Signed)
Spoke with Rod Holler from the [facility] hospital and let her know that the patient could stop the Brilinta but would need to remain on Aspirin. I also let her know that the patient needs to keep his follow up appointment on 10/16/19. She verbalizes understanding and states that they will let the patient know.

## 2019-09-14 ENCOUNTER — Telehealth: Payer: Self-pay | Admitting: Cardiology

## 2019-09-14 NOTE — Telephone Encounter (Signed)
Left message on patients voicemail to please return our call.   

## 2019-09-14 NOTE — Telephone Encounter (Signed)
Pt c/o medication issue:  1. Name of Medication: ticagrelor (BRILINTA) 90 MG TABS tablet  2. How are you currently taking this medication (dosage and times per day)? 1 tablet by mouth two times daily  3. Are you having a reaction (difficulty breathing--STAT)? No  4. What is your medication issue? [facility] Dulce Sellar is wanting him to stop this medication. [name] states he still has almost a full bottle left and was wanting to know if he should finish the bottle of medication before stopping. Please advise.

## 2019-10-08 ENCOUNTER — Ambulatory Visit: Payer: Medicare HMO | Admitting: Neurology

## 2019-10-12 DIAGNOSIS — R1310 Dysphagia, unspecified: Secondary | ICD-10-CM | POA: Insufficient documentation

## 2019-10-12 DIAGNOSIS — N200 Calculus of kidney: Secondary | ICD-10-CM | POA: Insufficient documentation

## 2019-10-12 DIAGNOSIS — I213 ST elevation (STEMI) myocardial infarction of unspecified site: Secondary | ICD-10-CM | POA: Insufficient documentation

## 2019-10-15 NOTE — Progress Notes (Signed)
Cardiology Office Note:    Date:  10/16/2019   ID:  Adrian Bell, DOB 01/13/1946, MRN [id number]  PCP:  [facility]  Cardiologist:  Adrian Herrlich, MD    Referring MD: [facility]    ASSESSMENT:    1. CAD in native artery   2. Essential hypertension   3. Mixed hyperlipidemia    PLAN:    In order of problems listed above:  1. Stable CAD doing well with medical therapy at this time would not advise surgery unless symptoms progress more frequent more severe disrupting his life and continue his current medical regimen including aspirin beta-blocker oral nitrate ranolazine and statin. 2. Stable continue current treatment including beta-blocker ACE inhibitor 3. Tolerates his statin continue the same check lipid profile and CMP liver function for safety   Next appointment: 6 months   Medication Adjustments/Labs and Tests Ordered: Current medicines are reviewed at length with the patient today.  Concerns regarding medicines are outlined above.  Orders Placed This Encounter  Procedures  . Comprehensive metabolic panel  . Lipid panel  . EKG 12-Lead   No orders of the defined types were placed in this encounter.   No chief complaint on file.   History of Present Illness:    Adrian Bell is a 74 y.o. male with a hx of  inferior STEMI with PCI and DES of RCA 07/21/18  last seen 03/26/2019. Compliance with diet, lifestyle and medications: Yes  Overall Adrian Bell is done well he has had 2 episodes of angina in the last 4 to 5 months quickly relieved with nitroglycerin and is pleased with the quality of his life and remains active.  He has some mild shortness of breath climbing stairs not severe limiting no palpitation or syncope and no intolerance of his cardiac meds including statin. Past Medical History:  Diagnosis Date  . Dysphagia   . Hyperlipidemia   . Kidney stones   . Myasthenia gravis (HCC)   . STEMI (ST elevation myocardial infarction) [facility])     Past Surgical  History:  Procedure Laterality Date  . BACK SURGERY    . CARDIAC CATHETERIZATION    . CORONARY/GRAFT ACUTE MI REVASCULARIZATION N/A 07/21/2018   Procedure: Coronary/Graft Acute MI Revascularization;  Surgeon: Corky Crafts, MD;  Location: [facility] INVASIVE CV LAB;  Service: Cardiovascular;  Laterality: N/A;  . CYSTOSCOPY WITH INSERTION OF UROLIFT  2018  . LASIK    . LEFT HEART CATH AND CORONARY ANGIOGRAPHY N/A 07/21/2018   Procedure: LEFT HEART CATH AND CORONARY ANGIOGRAPHY;  Surgeon: Corky Crafts, MD;  Location: [facility] INVASIVE CV LAB;  Service: Cardiovascular;  Laterality: N/A;  . LEFT HEART CATH AND CORONARY ANGIOGRAPHY N/A 12/08/2018   Procedure: LEFT HEART CATH AND CORONARY ANGIOGRAPHY;  Surgeon: Swaziland, Peter M, MD;  Location: [facility] INVASIVE CV LAB;  Service: Cardiovascular;  Laterality: N/A;  . LITHOTRIPSY    . NECK SURGERY    . TONSILLECTOMY      Current Medications: Current Meds  Medication Sig  . aspirin 81 MG chewable tablet Chew 1 tablet (81 mg total) by mouth daily.  . isosorbide mononitrate (IMDUR) 30 MG 24 hr tablet Take 0.5 tablets (15 mg total) by mouth daily.  . metoprolol succinate (TOPROL-XL) 25 MG 24 hr tablet Take 0.5 tablets (12.5 mg total) by mouth daily.  . mirabegron ER (MYRBETRIQ) 25 MG TB24 tablet Take 25 mg by mouth daily.  . nitroGLYCERIN (NITROSTAT) 0.4 MG SL tablet Place 1 tablet (0.4 mg total)  under the tongue every 5 (five) minutes as needed.  . NON FORMULARY Take 1 tablet by mouth every 3 (three) days. Rox-OTC medication for erectile dysfunction  . Pitavastatin Calcium (LIVALO) 2 MG TABS Take 1 tablet (2 mg total) by mouth daily.  . predniSONE (DELTASONE) 5 MG tablet Take 1 tablet (5 mg total) by mouth daily.  Marland Kitchen pyridostigmine (MESTINON) 60 MG tablet Take 1 tablet (60 mg total) by mouth 2 (two) times a day.  . ramipril (ALTACE) 5 MG capsule Take 5 mg by mouth daily.   . ranolazine (RANEXA) 500 MG 12 hr tablet Take 1 tablet (500 mg total) by mouth 2  (two) times daily.     Allergies:   Zetia [ezetimibe], Demerol [meperidine hcl], and Statins   Social History   Socioeconomic History  . Marital status: Married    Spouse name: Not on file  . Number of children: 3  . Years of education: 54  . Highest education level: Not on file  Occupational History  . 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 Smoking status: Never Smoker  . Smokeless tobacco: Former Neurosurgeon    Types: Engineer, drilling  . Vaping Use: Never used  Substance and Sexual Activity  . Alcohol use: Yes    Comment: occ   . Drug use: No  . Sexual activity: Not on file  Other Topics Concern  . Not on file  Social History Narrative   Lives with wife in a one story home.  Has 3 children.     Retired Public librarian.     Education: some college.    Right handed   Social Determinants of Health   Financial Resource Strain:   . Difficulty of Paying Living Expenses: Not on file  Food Insecurity:   . Worried About Programme researcher, broadcasting/film/video in the Last Year: Not on file  . Ran Out of Food in the Last Year: Not on file  Transportation Needs:   . Lack of Transportation (Medical): Not on file  . Lack of Transportation (Non-Medical): Not on file  Physical Activity:   . Days of Exercise per Week: Not on file  . Minutes of Exercise per Session: Not on file  Stress:   . Feeling of Stress : Not on file  Social Connections:   . Frequency of Communication with Friends and Family: Not on file  . Frequency of Social Gatherings with Friends and Family: Not on file  . Attends Religious Services: Not on file  . Active Member of Clubs or Organizations: Not on file  . Attends Banker Meetings: Not on file  . Marital Status: Not on file     Family History: The patient's family history includes Deep vein thrombosis in his brother; Diabetes in his sister; Esophageal cancer in his brother; Heart attack in his father and mother; Heart  disease in his mother; Stroke in his brother and sister. ROS:   Please see the history of present illness.    All other systems reviewed and are negative.  EKGs/Labs/Other Studies Reviewed:    The following studies were reviewed today:  EKG:  EKG ordered today and personally reviewed.  The ekg ordered today demonstrates sinus rhythm right bundle branch block  Recent Labs: 12/04/2018: BUN 15; Creatinine, Ser 1.00; Hemoglobin 12.8; Platelets 230; Potassium 5.1; Sodium 141  Recent Lipid Panel    Component Value Date/Time   CHOL 180 07/21/2018 2254   TRIG  127 07/21/2018 2254   HDL 59 07/21/2018 2254   CHOLHDL 3.1 07/21/2018 2254   VLDL 25 07/21/2018 2254   LDLCALC 96 07/21/2018 2254    Physical Exam:    VS:  BP 138/62 (BP Location: Right Arm, Patient Position: Sitting, Cuff Size: Normal)   Pulse 62   Ht 5\' 6"  (1.676 m)   Wt 174 lb 3.2 oz (79 kg)   SpO2 98%   BMI 28.12 kg/m     Wt Readings from Last 3 Encounters:  10/16/19 174 lb 3.2 oz (79 kg)  04/06/19 174 lb (78.9 kg)  03/26/19 174 lb (78.9 kg)     GEN:  Well nourished, well developed in no acute distress HEENT: Normal NECK: No JVD; No carotid bruits LYMPHATICS: No lymphadenopathy CARDIAC: RRR, no murmurs, rubs, gallops RESPIRATORY:  Clear to auscultation without rales, wheezing or rhonchi  ABDOMEN: Soft, non-tender, non-distended MUSCULOSKELETAL:  No edema; No deformity  SKIN: Warm and dry NEUROLOGIC:  Alert and oriented x 3 PSYCHIATRIC:  Normal affect    Signed, 05/24/19, MD  10/16/2019 12:07 PM    [facility]

## 2019-10-16 ENCOUNTER — Ambulatory Visit: Payer: Medicare HMO | Admitting: Cardiology

## 2019-10-16 ENCOUNTER — Encounter: Payer: Self-pay | Admitting: Cardiology

## 2019-10-16 ENCOUNTER — Other Ambulatory Visit: Payer: Self-pay

## 2019-10-16 VITALS — BP 138/62 | HR 62 | Ht 66.0 in | Wt 174.2 lb

## 2019-10-16 DIAGNOSIS — E782 Mixed hyperlipidemia: Secondary | ICD-10-CM

## 2019-10-16 DIAGNOSIS — I2510 Atherosclerotic heart disease of native coronary artery without angina pectoris: Secondary | ICD-10-CM

## 2019-10-16 DIAGNOSIS — I10 Essential (primary) hypertension: Secondary | ICD-10-CM | POA: Diagnosis not present

## 2019-10-16 NOTE — Patient Instructions (Signed)

## 2019-10-17 ENCOUNTER — Telehealth: Payer: Self-pay

## 2019-10-17 LAB — COMPREHENSIVE METABOLIC PANEL
ALT: 10 IU/L (ref 0–44)
AST: 18 IU/L (ref 0–40)
Albumin/Globulin Ratio: 2 (ref 1.2–2.2)
Albumin: 4.5 g/dL (ref 3.7–4.7)
Alkaline Phosphatase: 56 IU/L (ref 48–121)
BUN/Creatinine Ratio: 12 (ref 10–24)
BUN: 13 mg/dL (ref 8–27)
Bilirubin Total: 0.4 mg/dL (ref 0.0–1.2)
CO2: 25 mmol/L (ref 20–29)
Calcium: 9.1 mg/dL (ref 8.6–10.2)
Chloride: 105 mmol/L (ref 96–106)
Creatinine, Ser: 1.1 mg/dL (ref 0.76–1.27)
GFR calc Af Amer: 76 mL/min/{1.73_m2} (ref >59)
GFR calc non Af Amer: 66 mL/min/{1.73_m2} (ref >59)
Globulin, Total: 2.3 g/dL (ref 1.5–4.5)
Glucose: 99 mg/dL (ref 65–99)
Potassium: 4.6 mmol/L (ref 3.5–5.2)
Sodium: 140 mmol/L (ref 134–144)
Total Protein: 6.8 g/dL (ref 6.0–8.5)

## 2019-10-17 LAB — LIPID PANEL
Chol/HDL Ratio: 2.8 ratio (ref 0.0–5.0)
Cholesterol, Total: 165 mg/dL (ref 100–199)
HDL: 59 mg/dL (ref >39)
LDL Chol Calc (NIH): 86 mg/dL (ref 0–99)
Triglycerides: 115 mg/dL (ref 0–149)
VLDL Cholesterol Cal: 20 mg/dL (ref 5–40)

## 2019-10-17 NOTE — Telephone Encounter (Signed)
Spoke with patient regarding results and recommendation.  Patient verbalizes understanding and is agreeable to plan of care. Advised patient to call back with any issues or concerns.  

## 2019-10-17 NOTE — Telephone Encounter (Signed)
-----   Message from Baldo Daub, MD sent at 10/17/2019  8:02 AM EDT ----- Good result no changes he takes his statin 3 days a week

## 2019-12-03 ENCOUNTER — Other Ambulatory Visit: Payer: Self-pay

## 2019-12-03 ENCOUNTER — Ambulatory Visit: Payer: Medicare HMO | Admitting: Neurology

## 2019-12-03 ENCOUNTER — Encounter: Payer: Self-pay | Admitting: Neurology

## 2019-12-03 VITALS — BP 169/63 | HR 74 | Resp 18 | Ht 66.0 in | Wt 175.0 lb

## 2019-12-03 DIAGNOSIS — G7000 Myasthenia gravis without (acute) exacerbation: Secondary | ICD-10-CM | POA: Diagnosis not present

## 2019-12-03 NOTE — Patient Instructions (Signed)
Continue your medications as you are taking  Return to clinic in 6 months 

## 2019-12-03 NOTE — Progress Notes (Signed)
Follow-up Visit   Date: 12/03/19    Adrian Bell MRN: [id number] DOB: [birth year]   Interim History: Adrian Bell is a 74 y.o. right-handed Caucasian male with hypertension, kidney stones, and iron deficiency anemia returning to the clinic for follow-up of seropositive myasthenia gravis.  The patient was accompanied to the clinic by self.  History of present illness: Starting around the summer of 2018, he developed spells of slurred speech, difficulty swallowing, and double vision which is always worse by the afternoon and evening. Closing one eye would resolve his double vision. Slurred speech is worse after talking for longer periods of time. Rest always improves his symptoms.  He denies any arm or leg weakness.  He went to the emergency department in October and was evaluated for TIA with CT head, MRI brain, and echo which returned normal, per patient.  He was diagnosed with myasthenia after AChR returned positive. He was started on mestinon and within an hour of his first dose, he noticed resolution of double vision.  He was able to increase mestinon to 60mg  three times daily (8am, 1pm, and 5pm).  Prednisone was increased to 30mg  daily which controlled symptoms and he has been slowly tapered over the fall of 2019.   He had one episode of what sounds like transient global amnesia in 2016 which lasted about an hour.   UPDATE 12/03/2019:  He is here for follow-up visit. He has ben doing very well and denies any vision complaints, droopiness of the eyelids.  He is compliant with prednisone 5mg  and mestinon 60mg  BID.  He has mild cataracts which is being followed at the 2017.  No new complaints.    Medications:  Current Outpatient Medications on File Prior to Visit  Medication Sig Dispense Refill  . aspirin 81 MG chewable tablet Chew 1 tablet (81 mg total) by mouth daily. 90 tablet 1  . isosorbide mononitrate (IMDUR) 30 MG 24 hr tablet Take 0.5 tablets (15 mg total) by mouth daily. 90 tablet 3   . metoprolol succinate (TOPROL-XL) 25 MG 24 hr tablet Take 0.5 tablets (12.5 mg total) by mouth daily. 90 tablet 1  . mirabegron ER (MYRBETRIQ) 25 MG TB24 tablet Take 25 mg by mouth daily.    . nitroGLYCERIN (NITROSTAT) 0.4 MG SL tablet Place 1 tablet (0.4 mg total) under the tongue every 5 (five) minutes as needed. 25 tablet 2  . NON FORMULARY Take 1 tablet by mouth every 3 (three) days. Rox-OTC medication for erectile dysfunction    . Pitavastatin Calcium (LIVALO) 2 MG TABS Take 1 tablet (2 mg total) by mouth daily. 90 tablet 1  . predniSONE (DELTASONE) 5 MG tablet Take 1 tablet (5 mg total) by mouth daily. 90 tablet 3  . pyridostigmine (MESTINON) 60 MG tablet Take 1 tablet (60 mg total) by mouth 2 (two) times a day. 60 tablet 3  . ramipril (ALTACE) 5 MG capsule Take 5 mg by mouth daily.     . ranolazine (RANEXA) 500 MG 12 hr tablet Take 1 tablet (500 mg total) by mouth 2 (two) times daily. 60 tablet 2   No current facility-administered medications on file prior to visit.    Allergies:  Allergies  Allergen Reactions  . Zetia [Ezetimibe] Other (See Comments)    Weakness, shortness of breath  . Demerol [Meperidine Hcl]   . Statins Other (See Comments)    myalgias    Review of Systems:  CONSTITUTIONAL: No fevers, chills, night sweats, or weight loss.  EYES: No visual changes or eye pain ENT: No hearing changes.  No history of nose bleeds.   RESPIRATORY: No cough, wheezing and shortness of breath.   CARDIOVASCULAR: Negative for chest pain, and palpitations.   GI: Negative for abdominal discomfort, blood in stools or black stools.  No recent change in bowel habits.   GU:  No history of incontinence.   MUSCLOSKELETAL: No history of joint pain or swelling.  No myalgias.   SKIN: Negative for lesions, rash, and itching.   ENDOCRINE: Negative for cold or heat intolerance, polydipsia or goiter.   PSYCH:  No depression or anxiety symptoms.   NEURO: As Above.   Vital Signs:  There were  no vitals taken for this visit.  Neurological Exam: MENTAL STATUS including orientation to time, place, person, recent and remote memory, attention span and concentration, language, and fund of knowledge is normal.  Speech is not dysarthric.  CRANIAL NERVES:  Pupils equal round and reactive to light.  Normal conjugate, extra-ocular eye movements in all directions of gaze. Mild left ptosis.  Face is symmetric.  Facial muscles are 5/5 throughout.  Palate elevates symmetrically.  Tongue is midline.   MOTOR:  Motor strength is 5/5 in all extremities. No fatigability.  COORDINATION/GAIT:   Gait narrow based and stable.   Data: CT head 12/08/2016:  Unremarkable  Labs 04/20/2017:   AChR binding 9.20*, blocking 76*, and modulating 90*  CT chest wo contrast 05/04/2017:   1. Unremarkable CT examination the chest. No anterior mediastinal mass/thymoma is identified. 2. No acute pulmonary findings or worrisome pulmonary lesions. 3. Significant three-vessel coronary artery calcifications. 4. Cholelithiasis.  Labs received 06/29/2017:  TPMT  13 (normal).   IMPRESSION/PLAN: Seropositive bulbar myasthenia gravis without exacerbation (diagnosed 04/2017).  Thymoma negative.  Symptomatic improvement on prednisone 30mg  which has been tapered down to 5mg  daily.  He had breakthrough symptoms, when I attempted to reduce prednisone less than 5mg /d. I will keep him on prednisone 5mg  and mestinon 60mg  BID.    Chronic corticosteroid use.  Continue calcium and prilosec 20mg  daily.   Return to clinic in 6 months    Thank you for allowing me to participate in patient's care.  If I can answer any additional questions, I would be pleased to do so.    Sincerely,    Claramae Rigdon K. , DO

## 2020-01-08 IMAGING — CT CT CHEST W/ CM
2 of 4 series · 11 of 36 positions shown, 13 images · IV contrast (iopamidol)
Comparison: None.

CLINICAL DATA: Recent history of myasthenia gravis. Evaluate for
possible thymoma.

Creatinine was obtained on site at [HOSPITAL] at [REDACTED].
Results: Creatinine 1.0 mg/dL.
EXAM:
CT CHEST WITH CONTRAST
TECHNIQUE: Multidetector CT imaging of the chest was performed during
intravenous contrast administration.
CONTRAST:  75mL 90JC5K-QEE IOPAMIDOL (90JC5K-QEE) INJECTION 61%

[Series 2: chest 2.00 br40 s3 ax · axial · 0.56mm/px · z∈[+1679,+1934]mm · 8 of 152 slices shown, 10 images]
[im 12/152  mediastinal]
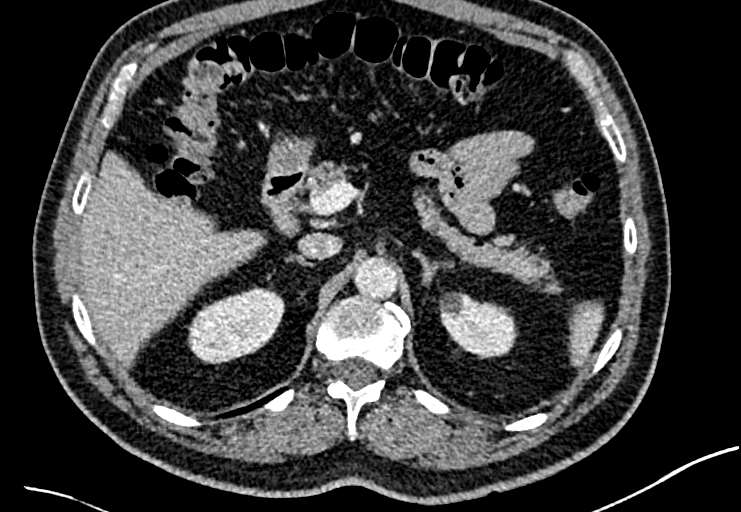
[im 12/152  lung]
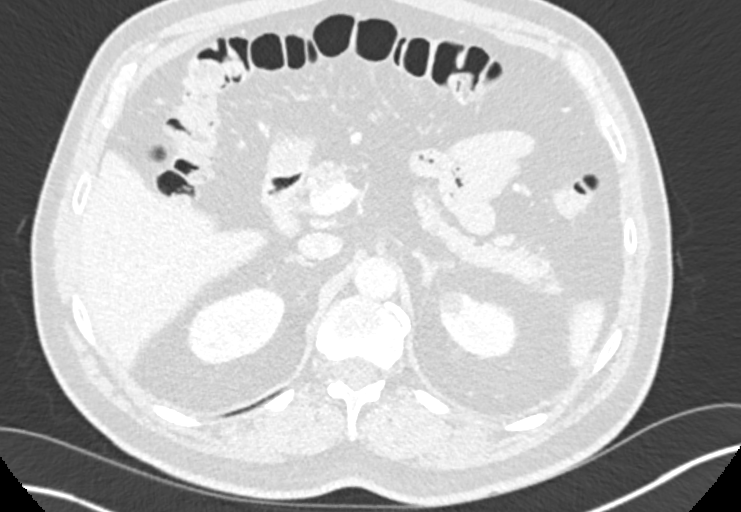
[im 35/152  lung]
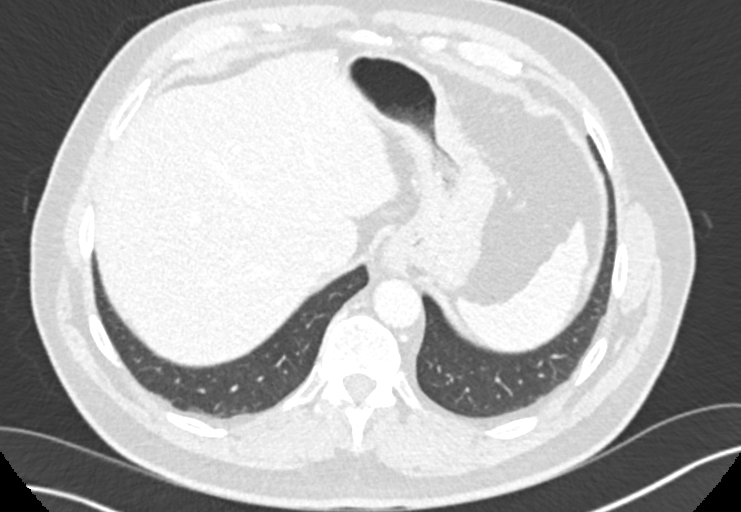
[im 47/152  lung]
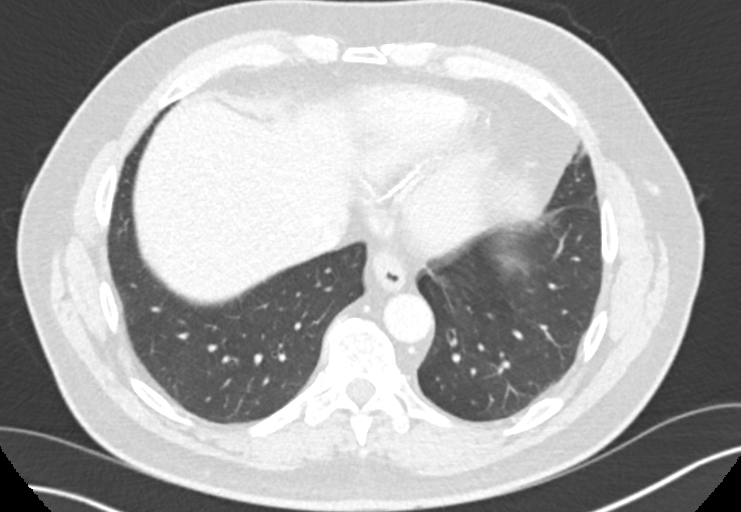
[im 70/152  lung]
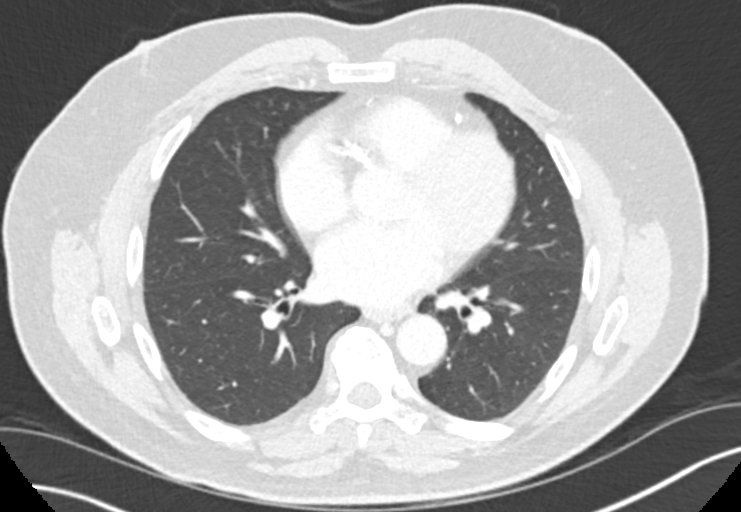
[im 82/152  mediastinal]
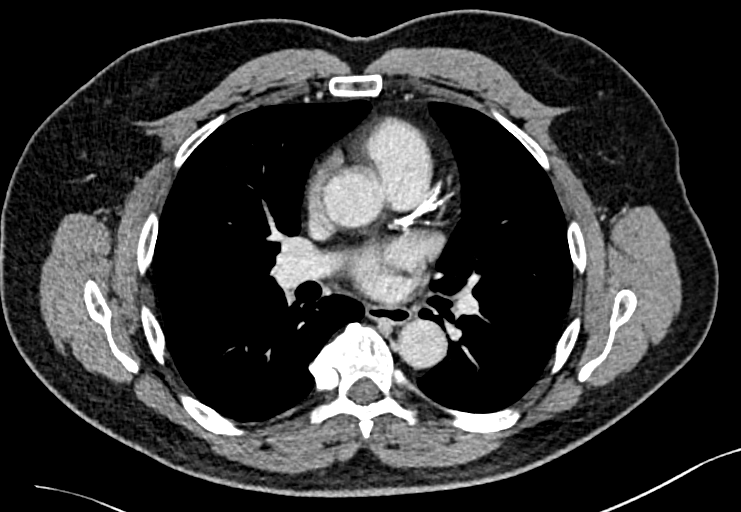
[im 82/152  lung]
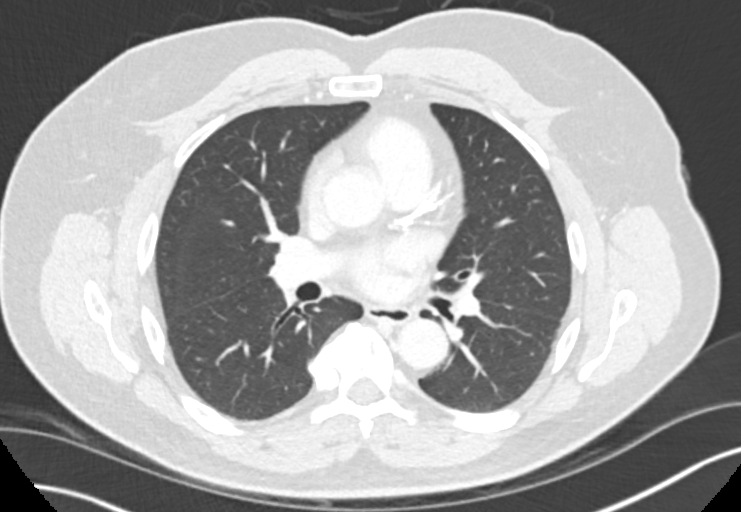
[im 105/152  lung]
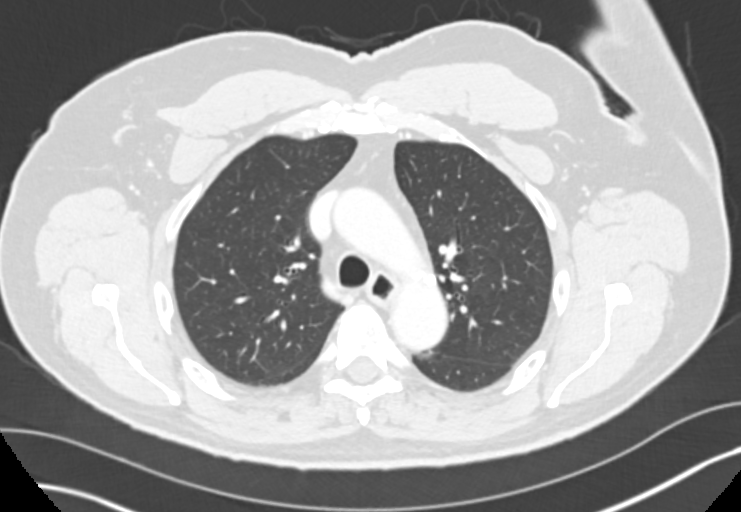
[im 117/152  lung]
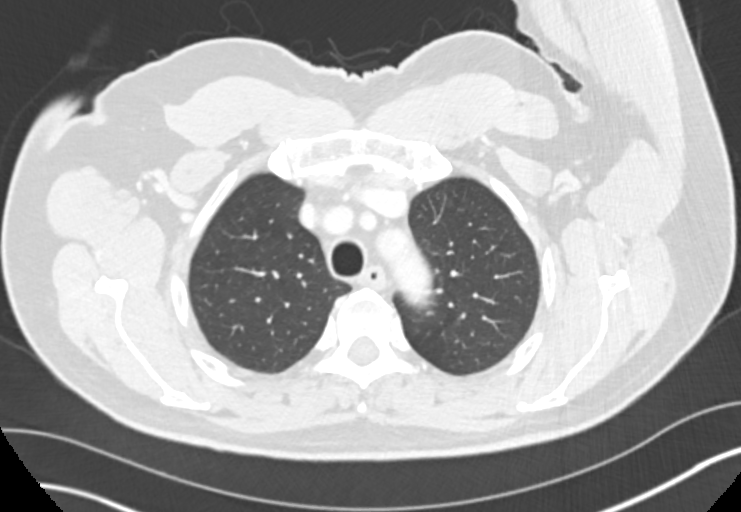
[im 140/152  lung]
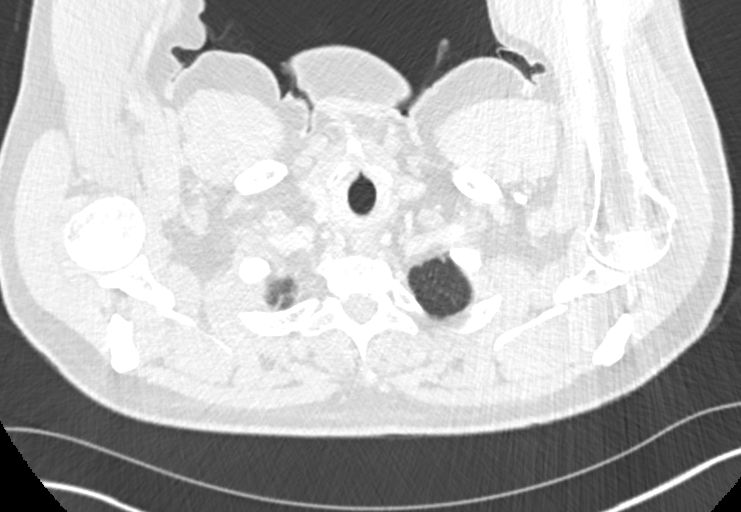

[Series 4: chest 2.00 br40 s3 cor · coronal · 0.59mm/px · 3 of 144 slices shown]
[im 29/144  lung]
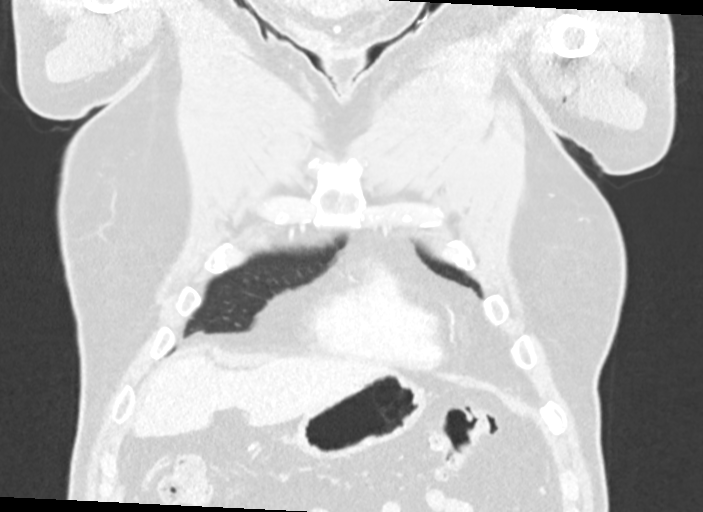
[im 58/144  lung]
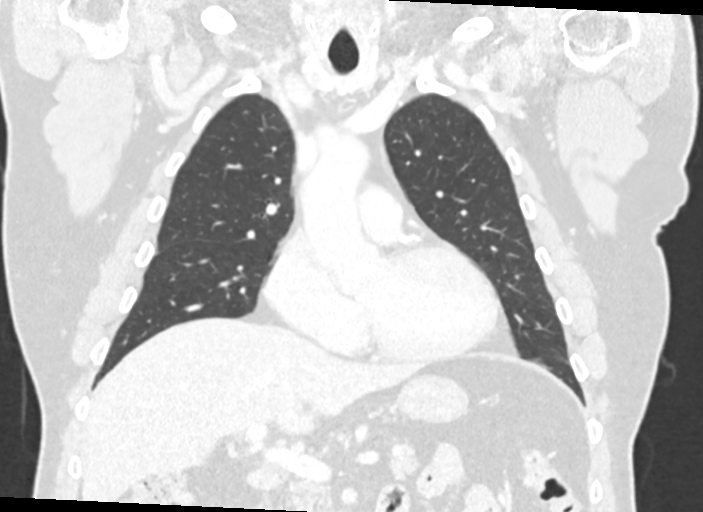
[im 86/144  lung]
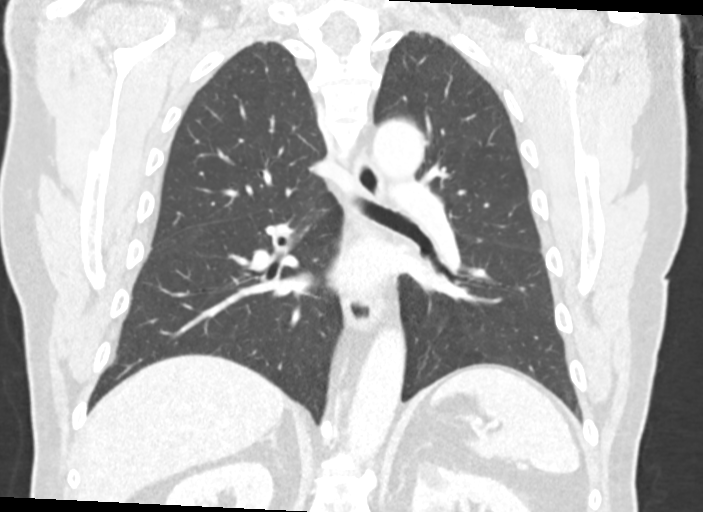

[11 of 36 positions shown; findings below may reference images not displayed]

FINDINGS: Cardiovascular: The heart is normal in size. No pericardial
effusion. There is mild tortuosity and minimal atherosclerotic
calcifications involving the aorta for age. No dissection. There are
fairly extensive three-vessel coronary artery calcifications.

Mediastinum/Nodes: No mediastinal or hilar mass or lymphadenopathy.
Specifically, no anterior mediastinal lesion or thymoma. The
esophagus is grossly normal.

Lungs/Pleura: The lungs are clear of an acute process. No pulmonary
nodules or pleural effusions.

Upper Abdomen: No significant upper abdominal findings. Small
scattered renal cysts are noted. Gallstones are noted in the
gallbladder. Mild to moderate pancreatic atrophy.

Musculoskeletal: No chest wall mass, supraclavicular or axillary
lymphadenopathy. The thyroid gland demonstrates a few small nodules.

No acute bony findings.
IMPRESSION: 1. Unremarkable CT examination the chest. No anterior mediastinal
mass/thymoma is identified.
2. No acute pulmonary findings or worrisome pulmonary lesions.
3. Significant three-vessel coronary artery calcifications.
4. Cholelithiasis.

Aortic Atherosclerosis ([id number]-PY6.6).

## 2020-02-15 ENCOUNTER — Other Ambulatory Visit: Payer: Self-pay | Admitting: Neurology

## 2020-02-18 DIAGNOSIS — Z20828 Contact with and (suspected) exposure to other viral communicable diseases: Secondary | ICD-10-CM | POA: Diagnosis not present

## 2020-02-18 DIAGNOSIS — R051 Acute cough: Secondary | ICD-10-CM | POA: Diagnosis not present

## 2020-02-18 DIAGNOSIS — R509 Fever, unspecified: Secondary | ICD-10-CM | POA: Diagnosis not present

## 2020-02-18 DIAGNOSIS — J189 Pneumonia, unspecified organism: Secondary | ICD-10-CM | POA: Diagnosis not present

## 2020-02-23 HISTORY — PX: COLONOSCOPY: SHX174

## 2020-04-16 NOTE — Progress Notes (Signed)
Cardiology Office Note:    Date:  04/17/2020   ID:  Adrian Bell, DOB 06/05/1945, MRN [id number]  PCP:  [facility]  Cardiologist:  Adrian Herrlich, MD    Referring MD: [facility]    ASSESSMENT:    1. Coronary artery disease involving native coronary artery of native heart with unstable angina pectoris (HCC)   2. Essential hypertension   3. Mixed hyperlipidemia    PLAN:    In order of problems listed above:  Stable in his situation think the symptoms are due to his neuromuscular disease and statin will discontinue I am concerned with his known CAD he may be having anginal equivalent of asked him to set up a myocardial perfusion study and continue medical therapy including aspirin beta-blocker oral nitrate ranolazine and he will transition the PCSK9 therapy. Stable BP at target continue current treatment  Next appointment: 6 months   Medication Adjustments/Labs and Tests Ordered: Current medicines are reviewed at length with the patient today.  Concerns regarding medicines are outlined above.  Orders Placed This Encounter  Procedures  . MYOCARDIAL PERFUSION IMAGING  . MYOCARDIAL PERFUSION IMAGING  . EKG 12-Lead   Meds ordered this encounter  Medications  . Evolocumab (REPATHA SURECLICK) 140 MG/ML SOAJ    Sig: Inject 2 mLs into the skin every 14 (fourteen) days.    Dispense:  6 mL    Refill:  3    Chief Complaint  Patient presents with  . Follow-up  . Coronary Artery Disease  . Hyperlipidemia    I think I am having a problem with my statin causing weakness and shortness of breath.  Happened to me previously.    History of Present Illness:    Adrian Bell is a 75 y.o. male with a hx of CAD inferior STEMI with PCI and drug-eluting stent right coronary artery 07/21/2018, hypertension hyperlipidemia last seen 10/16/2019.  His last coronary angiogram 12/08/2018 showed calcific CAD treated medically.  He is not doing well he thinks he is having a problem from his  statin again with weakness and exertional shortness of breath he is perhaps taking 1 nitroglycerin since last seen by me.  Were going to discontinue his statin with myasthenia gravis is good handcarried prescription to the [facility] the start PCSK9 therapy.  He is not having exertional angina but this could be equivalent symptoms I think we should do a myocardial perfusion study in our office.  No palpitation or syncope Compliance with diet, lifestyle and medications: Yes Past Medical History:  Diagnosis Date  . Dysphagia   . Hyperlipidemia   . Kidney stones   . Myasthenia gravis (HCC)   . STEMI (ST elevation myocardial infarction) [facility])     Past Surgical History:  Procedure Laterality Date  . BACK SURGERY    . CARDIAC CATHETERIZATION    . CORONARY/GRAFT ACUTE MI REVASCULARIZATION N/A 07/21/2018   Procedure: Coronary/Graft Acute MI Revascularization;  Surgeon: Corky Crafts, MD;  Location: [facility] INVASIVE CV LAB;  Service: Cardiovascular;  Laterality: N/A;  . CYSTOSCOPY WITH INSERTION OF UROLIFT  2018  . LASIK    . LEFT HEART CATH AND CORONARY ANGIOGRAPHY N/A 07/21/2018   Procedure: LEFT HEART CATH AND CORONARY ANGIOGRAPHY;  Surgeon: Corky Crafts, MD;  Location: [facility] INVASIVE CV LAB;  Service: Cardiovascular;  Laterality: N/A;  . LEFT HEART CATH AND CORONARY ANGIOGRAPHY N/A 12/08/2018   Procedure: LEFT HEART CATH AND CORONARY ANGIOGRAPHY;  Surgeon: Swaziland, Peter M, MD;  Location: [facility] INVASIVE  CV LAB;  Service: Cardiovascular;  Laterality: N/A;  . LITHOTRIPSY    . NECK SURGERY    . TONSILLECTOMY      Current Medications: Current Meds  Medication Sig  . aspirin 81 MG chewable tablet Chew 1 tablet (81 mg total) by mouth daily.  . Evolocumab (REPATHA SURECLICK) 140 MG/ML SOAJ Inject 2 mLs into the skin every 14 (fourteen) days.  . isosorbide mononitrate (IMDUR) 30 MG 24 hr tablet Take 0.5 tablets (15 mg total) by mouth daily.  . metoprolol succinate (TOPROL-XL) 25 MG 24 hr  tablet Take 0.5 tablets (12.5 mg total) by mouth daily. (Patient taking differently: Take 12.5 mg by mouth daily. 0.5 (12.5 mg ) daily)  . mirabegron ER (MYRBETRIQ) 25 MG TB24 tablet Take 25 mg by mouth daily.  . nitroGLYCERIN (NITROSTAT) 0.4 MG SL tablet Place 1 tablet (0.4 mg total) under the tongue every 5 (five) minutes as needed.  . predniSONE (DELTASONE) 5 MG tablet TAKE 1 TABLET EVERY DAY  . pyridostigmine (MESTINON) 60 MG tablet Take 1 tablet (60 mg total) by mouth 2 (two) times a day.  . ramipril (ALTACE) 5 MG capsule Take 5 mg by mouth daily.   . ranolazine (RANEXA) 500 MG 12 hr tablet Take 1 tablet (500 mg total) by mouth 2 (two) times daily.  . [DISCONTINUED] Pitavastatin Calcium (LIVALO) 2 MG TABS Take 1 tablet (2 mg total) by mouth daily. (Patient taking differently: Take 2 mg by mouth daily. Takes only twice a week)     Allergies:   Zetia [ezetimibe], Demerol [meperidine hcl], and Statins   Social History   Socioeconomic History  . Marital status: Married    Spouse name: Not on file  . Number of children: 3  . Years of education: 78  . Highest education level: Not on file  Occupational History  . 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 Smoking status: Never Smoker  . Smokeless tobacco: Former Neurosurgeon    Types: Engineer, drilling  . Vaping Use: Never used  Substance and Sexual Activity  . Alcohol use: Yes    Comment: occ   . Drug use: No  . Sexual activity: Not on file  Other Topics Concern  . Not on file  Social History Narrative   Lives with wife in a one story home.  Has 3 children.     Retired Public librarian.     Education: some college.    Right handed   Social Determinants of Health   Financial Resource Strain: Not on file  Food Insecurity: Not on file  Transportation Needs: Not on file  Physical Activity: Not on file  Stress: Not on file  Social Connections: Not on file     Family History: The patient's  family history includes Deep vein thrombosis in his brother; Diabetes in his sister; Esophageal cancer in his brother; Heart attack in his father and mother; Heart disease in his mother; Stroke in his brother and sister. ROS:   Please see the history of present illness.    All other systems reviewed and are negative.  EKGs/Labs/Other Studies Reviewed:    The following studies were reviewed today:  EKG:  EKG ordered today and personally reviewed.  The ekg ordered today demonstrates sinus rhythm right bundle branch block unchanged from 10/16/2019  Recent Labs: 10/16/2019: ALT 10; BUN 13; Creatinine, Ser 1.10; Potassium 4.6; Sodium 140  Recent Lipid Panel    Component Value Date/Time  CHOL 165 10/16/2019 1142   TRIG 115 10/16/2019 1142   HDL 59 10/16/2019 1142   CHOLHDL 2.8 10/16/2019 1142   CHOLHDL 3.1 07/21/2018 2254   VLDL 25 07/21/2018 2254   LDLCALC 86 10/16/2019 1142    Physical Exam:    VS:  BP 120/70   Pulse (!) 58   Ht 5\' 6"  (1.676 m)   Wt 181 lb (82.1 kg)   SpO2 99%   BMI 29.21 kg/m     Wt Readings from Last 3 Encounters:  04/17/20 181 lb (82.1 kg)  12/03/19 175 lb (79.4 kg)  10/16/19 174 lb 3.2 oz (79 kg)     GEN:  Well nourished, well developed in no acute distress HEENT: Normal NECK: No JVD; No carotid bruits LYMPHATICS: No lymphadenopathy CARDIAC: RRR, no murmurs, rubs, gallops RESPIRATORY:  Clear to auscultation without rales, wheezing or rhonchi  ABDOMEN: Soft, non-tender, non-distended MUSCULOSKELETAL:  No edema; No deformity  SKIN: Warm and dry NEUROLOGIC:  Alert and oriented x 3 PSYCHIATRIC:  Normal affect    Signed, 10/18/19, MD  04/17/2020 8:47 AM    [facility]

## 2020-04-17 ENCOUNTER — Encounter: Payer: Self-pay | Admitting: Cardiology

## 2020-04-17 ENCOUNTER — Ambulatory Visit: Payer: Medicare HMO | Admitting: Cardiology

## 2020-04-17 ENCOUNTER — Other Ambulatory Visit: Payer: Self-pay

## 2020-04-17 ENCOUNTER — Telehealth: Payer: Self-pay | Admitting: *Deleted

## 2020-04-17 VITALS — BP 120/70 | HR 58 | Ht 66.0 in | Wt 181.0 lb

## 2020-04-17 DIAGNOSIS — I10 Essential (primary) hypertension: Secondary | ICD-10-CM | POA: Diagnosis not present

## 2020-04-17 DIAGNOSIS — I25110 Atherosclerotic heart disease of native coronary artery with unstable angina pectoris: Secondary | ICD-10-CM

## 2020-04-17 DIAGNOSIS — E782 Mixed hyperlipidemia: Secondary | ICD-10-CM

## 2020-04-17 MED ORDER — REPATHA SURECLICK 140 MG/ML ~~LOC~~ SOAJ: 2.0000 mL | SUBCUTANEOUS | 3 refills | Status: DC

## 2020-04-17 NOTE — Telephone Encounter (Signed)
Patient given detailed instructions per Myocardial Perfusion Study Information Sheet for the test on 04/22/20 at 0745. Patient notified to arrive 15 minutes early and that it is imperative to arrive on time for appointment to keep from having the test rescheduled.  If you need to cancel or reschedule your appointment, please call the office within 24 hours of your appointment. . Patient verbalized understanding.Hasspacher, Adelene Idler No mychart available

## 2020-04-17 NOTE — Patient Instructions (Signed)
Medication Instructions:  Your physician has recommended you make the following change in your medication:  STOP: Livalo START: Repatha 140 mg per 2 ml injection. Inject one pen into the skin every two weeks. *If you need a refill on your cardiac medications before your next appointment, please call your pharmacy*   Lab Work: None If you have labs (blood work) drawn today and your tests are completely normal, you will receive your results only by: Marland Kitchen MyChart Message (if you have MyChart) OR . A paper copy in the mail If you have any lab test that is abnormal or we need to change your treatment, we will call you to review the results.   Testing/Procedures:   [facility] [address] [place], [place] [address] Phone:  [phone]    Please arrive 15 minutes prior to your appointment time for registration and insurance purposes.  The test will take approximately 3 to 4 hours to complete; you may bring reading material.  If someone comes with you to your appointment, they will need to remain in the main lobby due to limited space in the testing area. **If you are pregnant or breastfeeding, please notify the nuclear lab prior to your appointment**  How to prepare for your Myocardial Perfusion Test: . Do not eat or drink 3 hours prior to your test, except you may have water. . Do not consume products containing caffeine (regular or decaffeinated) 12 hours prior to your test. (ex: coffee, chocolate, sodas, tea). . Do bring a list of your current medications with you.  If not listed below, you may take your medications as normal. . Do wear comfortable clothes (no dresses or overalls) and walking shoes, tennis shoes preferred (No heels or open toe shoes are allowed). . Do NOT wear cologne, perfume, aftershave, or lotions (deodorant is allowed). . If these instructions are not followed, your test will have to be rescheduled.  Please report to [address] for your test.  If you have questions or concerns about your appointment, you can call the [facility] at [phone].  If you cannot keep your appointment, please provide 24 hours notification to the Nuclear Lab, to avoid a possible $50 charge to your account.    Follow-Up: At [facility], you and your health needs are our priority.  As part of our continuing mission to provide you with exceptional heart care, we have created designated Provider Care Teams.  These Care Teams include your primary Cardiologist (physician) and Advanced Practice Providers (APPs -  Physician Assistants and Nurse Practitioners) who all work together to provide you with the care you need, when you need it.  We recommend signing up for the patient portal called "MyChart".  Sign up information is provided on this After Visit Summary.  MyChart is used to connect with patients for Virtual Visits (Telemedicine).  Patients are able to view lab/test results, encounter notes, upcoming appointments, etc.  Non-urgent messages can be sent to your provider as well.   To learn more about what you can do with MyChart, go to [email or web address].    Your next appointment:   6 month(s)  The format for your next appointment:   In Person  Provider:   Norman Herrlich, MD   Other Instructions

## 2020-04-17 NOTE — Addendum Note (Signed)
Addended by: Norman Herrlich on: 04/17/2020 02:44 PM   Modules accepted: Orders

## 2020-04-22 ENCOUNTER — Ambulatory Visit (INDEPENDENT_AMBULATORY_CARE_PROVIDER_SITE_OTHER): Payer: Medicare HMO

## 2020-04-22 ENCOUNTER — Other Ambulatory Visit: Payer: Self-pay

## 2020-04-22 VITALS — Ht 66.0 in | Wt 181.0 lb

## 2020-04-22 DIAGNOSIS — I25110 Atherosclerotic heart disease of native coronary artery with unstable angina pectoris: Secondary | ICD-10-CM

## 2020-04-22 DIAGNOSIS — R110 Nausea: Secondary | ICD-10-CM

## 2020-04-22 DIAGNOSIS — E782 Mixed hyperlipidemia: Secondary | ICD-10-CM | POA: Diagnosis not present

## 2020-04-22 DIAGNOSIS — I2510 Atherosclerotic heart disease of native coronary artery without angina pectoris: Secondary | ICD-10-CM

## 2020-04-22 DIAGNOSIS — I2119 ST elevation (STEMI) myocardial infarction involving other coronary artery of inferior wall: Secondary | ICD-10-CM

## 2020-04-22 DIAGNOSIS — I10 Essential (primary) hypertension: Secondary | ICD-10-CM | POA: Diagnosis not present

## 2020-04-22 LAB — MYOCARDIAL PERFUSION IMAGING
LV dias vol: 100 mL (ref 62–150)
LV sys vol: 33 mL
Peak HR: 67 {beats}/min
Rest HR: 49 {beats}/min
SDS: 1
SRS: 4
SSS: 5
TID: 1.03

## 2020-04-22 MED ORDER — TECHNETIUM TC 99M TETROFOSMIN IV KIT
32.4000 | PACK | Freq: Once | INTRAVENOUS | Status: AC | PRN
  Administered 2020-04-22: 32.4 via INTRAVENOUS

## 2020-04-22 MED ORDER — TECHNETIUM TC 99M TETROFOSMIN IV KIT
9.6000 | PACK | Freq: Once | INTRAVENOUS | Status: AC | PRN
  Administered 2020-04-22: 9.6 via INTRAVENOUS

## 2020-04-22 MED ORDER — AMINOPHYLLINE 25 MG/ML IV SOLN
75.0000 mg | Freq: Once | INTRAVENOUS | Status: AC
Start: 2020-04-22 — End: 2020-04-22
  Administered 2020-04-22: 75 mg via INTRAVENOUS

## 2020-04-22 MED ORDER — REGADENOSON 0.4 MG/5ML IV SOLN
0.4000 mg | Freq: Once | INTRAVENOUS | Status: AC
  Administered 2020-04-22: 0.4 mg via INTRAVENOUS

## 2020-05-01 ENCOUNTER — Telehealth: Payer: Self-pay | Admitting: Cardiology

## 2020-05-01 MED ORDER — PITAVASTATIN CALCIUM 2 MG PO TABS: 2.0000 mg | ORAL_TABLET | Freq: Every day | ORAL | 3 refills | Status: DC

## 2020-05-01 NOTE — Telephone Encounter (Signed)
Yes

## 2020-05-01 NOTE — Telephone Encounter (Signed)
Patient called to talk with Dr. Dulce Sellar or nurse about the medication Evolocumab (REPATHA SURECLICK) 140 MG/ML SOAJ. Patient says that he prefer to stay on the medication that he was taking before it until appt in August. Please call to discuss

## 2020-05-01 NOTE — Telephone Encounter (Signed)
Left message on patients voicemail to please return our call.   

## 2020-05-01 NOTE — Telephone Encounter (Signed)
Spoke to the patient just now and let him know that this would be fine per Dr. Dulce Sellar. I sent this prescription in for him at this time.

## 2020-06-02 ENCOUNTER — Encounter: Payer: Self-pay | Admitting: Neurology

## 2020-06-02 ENCOUNTER — Other Ambulatory Visit: Payer: Self-pay

## 2020-06-02 ENCOUNTER — Ambulatory Visit: Payer: Medicare HMO | Admitting: Neurology

## 2020-06-02 VITALS — BP 170/64 | HR 77 | Ht 66.0 in | Wt 181.0 lb

## 2020-06-02 DIAGNOSIS — G7000 Myasthenia gravis without (acute) exacerbation: Secondary | ICD-10-CM | POA: Diagnosis not present

## 2020-06-02 NOTE — Patient Instructions (Signed)
Great to see you today!  Continue prednisone 5mg  daily  You may adjust pyridostigmine 60mg  tablet 1-3 times daily as needed.  Hopefully, this will help your cramps.  Return to clinic 6 months

## 2020-06-02 NOTE — Progress Notes (Signed)
Follow-up Visit   Date: 06/02/20    Adrian Bell MRN: [id number] DOB: March 04, 1945   Interim History: Adrian Bell is a 75 y.o. right-handed Caucasian male with hypertension, kidney stones, and iron deficiency anemia returning to the clinic for follow-up of seropositive myasthenia gravis.  The patient was accompanied to the clinic by self.  History of present illness: Starting around the summer of 2018, he developed spells of slurred speech, difficulty swallowing, and double vision which is always worse by the afternoon and evening. Closing one eye would resolve his double vision. Slurred speech is worse after talking for longer periods of time. Rest always improves his symptoms.  He denies any arm or leg weakness.  He went to the emergency department in October and was evaluated for TIA with CT head, MRI brain, and echo which returned normal, per patient.  He was diagnosed with myasthenia after AChR returned positive. He was started on mestinon and within an hour of his first dose, he noticed resolution of double vision.  He was able to increase mestinon to 60mg  three times daily (8am, 1pm, and 5pm).  Prednisone was increased to 30mg  daily which controlled symptoms and he has been slowly tapered over the fall of 2019.   He had one episode of what sounds like transient global amnesia in 2016 which lasted about an hour.    UPDATE 06/02/2020:  He is here for follow-up visit.  He remains on prednisone 5mg  and mestinon 60mg  twice daily.  Sometimes, he continues to get blurred vision/halo in the evenings, which he thinks is due to his cataracts.  He denies droopiness of the eyelids or double vision.  No difficulty swallowing/talking or limb weakness.  He has noticed increased cramps in the legs, if he takes an extra mestinon.   Medications:  Current Outpatient Medications on File Prior to Visit  Medication Sig Dispense Refill  . aspirin 81 MG chewable tablet Chew 1 tablet (81 mg total) by mouth  daily. 90 tablet 1  . isosorbide mononitrate (IMDUR) 30 MG 24 hr tablet Take 0.5 tablets (15 mg total) by mouth daily. 90 tablet 3  . metoprolol succinate (TOPROL-XL) 25 MG 24 hr tablet Take 0.5 tablets (12.5 mg total) by mouth daily. (Patient taking differently: Take 12.5 mg by mouth daily. 0.5 (12.5 mg ) daily) 90 tablet 1  . mirabegron ER (MYRBETRIQ) 25 MG TB24 tablet Take 25 mg by mouth daily.    . nitroGLYCERIN (NITROSTAT) 0.4 MG SL tablet Place 1 tablet (0.4 mg total) under the tongue every 5 (five) minutes as needed. 25 tablet 2  . Pitavastatin Calcium (LIVALO) 2 MG TABS Take by mouth daily.    . predniSONE (DELTASONE) 5 MG tablet TAKE 1 TABLET EVERY DAY 90 tablet 3  . pyridostigmine (MESTINON) 60 MG tablet Take 1 tablet (60 mg total) by mouth 2 (two) times a day. 60 tablet 3  . ramipril (ALTACE) 5 MG capsule Take 5 mg by mouth daily.     . ranolazine (RANEXA) 500 MG 12 hr tablet Take 1 tablet (500 mg total) by mouth 2 (two) times daily. 60 tablet 2  . NON FORMULARY Take 1 tablet by mouth every 3 (three) days. Rox-OTC medication for erectile dysfunction (Patient not taking: No sig reported)    . Pitavastatin Calcium 2 MG TABS Take 1 tablet (2 mg total) by mouth daily. (Patient not taking: Reported on 06/02/2020) 90 tablet 3   No current facility-administered medications on file prior to visit.  Allergies:  Allergies  Allergen Reactions  . Zetia [Ezetimibe] Other (See Comments)    Weakness, shortness of breath  . Demerol [Meperidine Hcl]   . Statins Other (See Comments)    myalgias    Vital Signs:  BP (!) 170/64   Pulse 77   Ht 5\' 6"  (1.676 m)   Wt 181 lb (82.1 kg)   SpO2 98%   BMI 29.21 kg/m   Neurological Exam: MENTAL STATUS including orientation to time, place, person, recent and remote memory, attention span and concentration, language, and fund of knowledge is normal.  Speech is not dysarthric.  CRANIAL NERVES:  Pupils equal round and reactive to light.  Normal  conjugate, extra-ocular eye movements in all directions of gaze. Mild left ptosis.  Face is symmetric.  Facial muscles are 5/5 throughout.  Palate elevates symmetrically.  Tongue is midline.   MOTOR:  Motor strength is 5/5 in all extremities. No fatigability.  COORDINATION/GAIT:   Gait narrow based and stable.   Data: CT head 12/08/2016:  Unremarkable  Labs 04/20/2017:   AChR binding 9.20*, blocking 76*, and modulating 90*  CT chest wo contrast 05/04/2017:   1. Unremarkable CT examination the chest. No anterior mediastinal mass/thymoma is identified. 2. No acute pulmonary findings or worrisome pulmonary lesions. 3. Significant three-vessel coronary artery calcifications. 4. Cholelithiasis.  Labs received 06/29/2017:  TPMT  13 (normal).   IMPRESSION/PLAN: Seropositive bulbar myasthenia gravis without exacerbation (diagnosed 04/2017).  Thymoma negative.  Symptomatic improvement on prednisone 30mg  which has been tapered down to 5mg  daily.  He had breakthrough symptoms, when I attempted to reduce prednisone less than 5mg /d. Continue prednisone 5mg  daily OK to adjust mestinon to 30mg  (half-tablet) - 60mg  three times daily as needed  Chronic corticosteroid use.  Continue calcium and prilosec 20mg  daily.   Return to clinic in 6 months    Thank you for allowing me to participate in patient's care.  If I can answer any additional questions, I would be pleased to do so.    Sincerely,    Jmarion Christiano K. 05/2017, DO

## 2020-10-15 NOTE — Progress Notes (Signed)
Cardiology Office Note:    Date:  10/16/2020   ID:  Adrian Bell, DOB 11/16/1945, MRN [id number]  PCP:  [facility]  Cardiologist:  Norman Herrlich, MD    Referring MD: [facility]    ASSESSMENT:    1. Coronary artery disease involving native coronary artery of native heart with unstable angina pectoris (HCC)   2. Essential hypertension   3. Mixed hyperlipidemia   4. Myasthenia gravis (HCC)    PLAN:    In order of problems listed above:  Bartosz continues to do well with CAD has had a marked improvement in his functional complaints of shortness of breath and weakness with treatment of iron deficiency at this time and continue medical treatment including aspirin low-dose beta-blocker along with ranolazine and a statin.  He does not require Ischemia evaluation. Blood pressure target continue current treatment Fortunately tolerates his low intensity statin continue same lipids at target Stable myasthenia is on long-term steroid therapy   Next appointment: 1 year   Medication Adjustments/Labs and Tests Ordered: Current medicines are reviewed at length with the patient today.  Concerns regarding medicines are outlined above.  No orders of the defined types were placed in this encounter.  No orders of the defined types were placed in this encounter.   Chief Complaint  Patient presents with   Follow-up   Coronary Artery Disease     History of Present Illness:    Adrian Bell is a 75 y.o. male with a hx of myasthenia gravis CAD with inferior ST elevation MI PCI and drug-eluting stent to the right coronary artery 07/21/2018 hypertension hyperlipidemia last seen 04/16/2020 and [facility].  Compliance with diet, lifestyle and medications: Yes  Had a myocardial perfusion study 04/22/2020 low risk.  Ejection fraction 67% normal LV function no ST segment abnormality during pharmacologic stress and had a small fixed defect in the apex  felt to be attenuation and no ischemia.  Recent labs [facility] 09/24/2020 hemoglobin 9.7 MCV is normal 83 1328 2022 lipid profile cholesterol 182 triglycerides 159 HDL 57 LDL 93  He is on iron supplementation he has had upper and lower endoscopy and is markedly improved with strength endurance and shortness of breath has resolved. He did receive PCSK9 therapy through [place] benefits but he does tolerate Livalo He is having no worsening muscle aches from his baseline with myasthenia gravis. He has had no anginal discomfort edema shortness of breath palpitation or syncope. Past Medical History:  Diagnosis Date   Dysphagia    Heart attack (HCC) 2020   Hyperlipidemia    Kidney stones    Myasthenia gravis (HCC)    STEMI (ST elevation myocardial infarction) [facility])     Past Surgical History:  Procedure Laterality Date   BACK SURGERY     CARDIAC CATHETERIZATION     CORONARY/GRAFT ACUTE MI REVASCULARIZATION N/A 07/21/2018   Procedure: Coronary/Graft Acute MI Revascularization;  Surgeon: Corky Crafts, MD;  Location: [facility] INVASIVE CV LAB;  Service: Cardiovascular;  Laterality: N/A;   CYSTOSCOPY WITH INSERTION OF UROLIFT  2018   LASIK     LEFT HEART CATH AND CORONARY ANGIOGRAPHY N/A 07/21/2018   Procedure: LEFT HEART CATH AND CORONARY ANGIOGRAPHY;  Surgeon: Corky Crafts, MD;  Location: [facility] INVAS[facility] CV LAB;  Service: Cardiovascular;  Laterality: N/A;   LEFT HEART CATH AND CORONARY ANGIOGRAPHY N/A 12/08/2018   Procedure: LEFT HEART CATH AND CORONARY ANGIOGRAPHY;  Surgeon: [place], Peter M,  MD;  Location: [facility] INVASIVE CV LAB;  Service: Cardiovascular;  Laterality: N/A;   LITHOTRIPSY     NECK SURGERY     TONSILLECTOMY      Current Medications: Current Meds  Medication Sig   aspirin 81 MG chewable tablet Chew 1 tablet (81 mg total) by mouth daily.   ferrous gluconate (FERGON) 324 MG tablet Take 1 tablet by mouth 3 (three) times a week. Monday, Wednesday AND Friday   isosorbide  mononitrate (IMDUR) 30 MG 24 hr tablet Take 0.5 tablets (15 mg total) by mouth daily.   metoprolol succinate (TOPROL-XL) 25 MG 24 hr tablet Take 0.5 tablets (12.5 mg total) by mouth daily. (Patient taking differently: Take 12.5 mg by mouth daily. 0.5 (12.5 mg ) daily)   nitroGLYCERIN (NITROSTAT) 0.4 MG SL tablet Place 1 tablet (0.4 mg total) under the tongue every 5 (five) minutes as needed.   NON FORMULARY Take 1 tablet by mouth every 3 (three) days. Rox-OTC medication for erectile dysfunction   Pitavastatin Calcium 2 MG TABS Take 1 tablet (2 mg total) by mouth daily.   predniSONE (DELTASONE) 5 MG tablet TAKE 1 TABLET EVERY DAY   pyridostigmine (MESTINON) 60 MG tablet Take 1 tablet (60 mg total) by mouth 2 (two) times a day.   ramipril (ALTACE) 5 MG capsule Take 5 mg by mouth daily.    ranolazine (RANEXA) 500 MG 12 hr tablet Take 1 tablet (500 mg total) by mouth 2 (two) times daily.   sucralfate (CARAFATE) 1 g tablet Take 1 g by mouth daily.     Allergies:   Zetia [ezetimibe], Demerol [meperidine hcl], and Statins   Social History   Socioeconomic History   Marital status: Married    Spouse name: Not on file   Number of children: 3   Years of education: 14   Highest education level: Not on file  Occupational History   Occupation: retired    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  Tobacco Use   Smoking status: Never   Smokeless tobacco: Former    Types: Chew    Quit date: 2009  Vaping Use   Vaping Use: Never used  Substance and Sexual Activity   Alcohol use: Yes    Comment: occ    Drug use: No   Sexual activity: Not on file  Other Topics Concern   Not on file  Social History Narrative   Lives with wife in a one story home.  Has 3 children.     Retired Public librarian.     Education: some college.    Right handed   Drinks caffeine    Social Determinants of Health   Financial Resource Strain: Not on file  Food Insecurity: Not on file  Transportation Needs: Not on  file  Physical Activity: Not on file  Stress: Not on file  Social Connections: Not on file     Family History: The patient's family history includes Deep vein thrombosis in his brother; Diabetes in his sister; Esophageal cancer in his brother; Heart attack in his father and mother; Heart disease in his mother; Stroke in his brother and sister. ROS:   Please see the history of present illness.    All other systems reviewed and are negative.  EKGs/Labs/Other Studies Reviewed:    The following studies were reviewed today:    Recent Labs: No results found for requested labs within last 8760 hours.  Recent Lipid Panel    Component Value Date/Time   CHOL 165 10/16/2019 1142  TRIG 115 10/16/2019 1142   HDL 59 10/16/2019 1142   CHOLHDL 2.8 10/16/2019 1142   CHOLHDL 3.1 07/21/2018 2254   VLDL 25 07/21/2018 2254   LDLCALC 86 10/16/2019 1142    Physical Exam:    VS:  BP 134/68 (BP Location: Right Arm, Patient Position: Sitting, Cuff Size: Normal)   Pulse (!) 58   Ht 5\' 6"  (1.676 m)   Wt 177 lb 12.8 oz (80.6 kg)   SpO2 99%   BMI 28.70 kg/m     Wt Readings from Last 3 Encounters:  10/16/20 177 lb 12.8 oz (80.6 kg)  06/02/20 181 lb (82.1 kg)  04/22/20 181 lb (82.1 kg)     GEN:  Well nourished, well developed in no acute distress HEENT: Normal NECK: No JVD; No carotid bruits LYMPHATICS: No lymphadenopathy CARDIAC: RRR, no murmurs, rubs, gallops RESPIRATORY:  Clear to auscultation without rales, wheezing or rhonchi  ABDOMEN: Soft, non-tender, non-distended MUSCULOSKELETAL:  No edema; No deformity  SKIN: Warm and dry NEUROLOGIC:  Alert and oriented x 3 PSYCHIATRIC:  Normal affect    Signed, 06/22/20, MD  10/16/2020 8:15 AM    [facility]

## 2020-10-16 ENCOUNTER — Ambulatory Visit: Payer: Medicare HMO | Admitting: Cardiology

## 2020-10-16 ENCOUNTER — Other Ambulatory Visit: Payer: Self-pay

## 2020-10-16 ENCOUNTER — Encounter: Payer: Self-pay | Admitting: Cardiology

## 2020-10-16 VITALS — BP 134/68 | HR 58 | Ht 66.0 in | Wt 177.8 lb

## 2020-10-16 DIAGNOSIS — I10 Essential (primary) hypertension: Secondary | ICD-10-CM

## 2020-10-16 DIAGNOSIS — G7000 Myasthenia gravis without (acute) exacerbation: Secondary | ICD-10-CM

## 2020-10-16 DIAGNOSIS — I25110 Atherosclerotic heart disease of native coronary artery with unstable angina pectoris: Secondary | ICD-10-CM | POA: Diagnosis not present

## 2020-10-16 DIAGNOSIS — E782 Mixed hyperlipidemia: Secondary | ICD-10-CM | POA: Diagnosis not present

## 2020-10-16 NOTE — Patient Instructions (Signed)
Medication Instructions:   Your physician recommends that you continue on your current medications as directed. Please refer to the Current Medication list given to you today.  *If you need a refill on your cardiac medications before your next appointment, please call your pharmacy*   Lab Work: NONE ORDERED  TODAY   If you have labs (blood work) drawn today and your tests are completely normal, you will receive your results only by: MyChart Message (if you have MyChart) OR A paper copy in the mail If you have any lab test that is abnormal or we need to change your treatment, we will call you to review the results.   Testing/Procedures: NONE ORDERED  TODAY    Follow-Up: At [facility], you and your health needs are our priority.  As part of our continuing mission to provide you with exceptional heart care, we have created designated Provider Care Teams.  These Care Teams include your primary Cardiologist (physician) and Advanced Practice Providers (APPs -  Physician Assistants and Nurse Practitioners) who all work together to provide you with the care you need, when you need it.  We recommend signing up for the patient portal called "MyChart".  Sign up information is provided on this After Visit Summary.  MyChart is used to connect with patients for Virtual Visits (Telemedicine).  Patients are able to view lab/test results, encounter notes, upcoming appointments, etc.  Non-urgent messages can be sent to your provider as well.   To learn more about what you can do with MyChart, go to [email or web address].    Your next appointment:   1 year(s)  The format for your next appointment:   In Person  Provider:   You will see Norman Herrlich, MD.  Or, you can be scheduled with the following Advanced Practice Provider on your designated Care Team (at our [facility]):    Other Instructions

## 2020-12-05 ENCOUNTER — Ambulatory Visit: Payer: Medicare HMO | Admitting: Neurology

## 2020-12-05 ENCOUNTER — Other Ambulatory Visit: Payer: Self-pay

## 2020-12-05 VITALS — BP 136/70 | HR 76 | Ht 66.0 in | Wt 177.2 lb

## 2020-12-05 DIAGNOSIS — G7000 Myasthenia gravis without (acute) exacerbation: Secondary | ICD-10-CM | POA: Diagnosis not present

## 2020-12-05 MED ORDER — PREDNISONE 5 MG PO TABS: 5.0000 mg | ORAL_TABLET | Freq: Every day | ORAL | 3 refills | Status: DC

## 2020-12-05 NOTE — Patient Instructions (Addendum)
OK to stop mestinon   Continue prednisone 5mg  daily  Return to clinic in 6 month

## 2020-12-05 NOTE — Progress Notes (Signed)
Follow-up Visit   Date: 12/05/20    Adrian Bell MRN: [id number] DOB: [birth year]   Interim History: Adrian Bell is a 75 y.o. right-handed Caucasian male with hypertension, kidney stones, and iron deficiency anemia returning to the clinic for follow-up of seropositive myasthenia gravis.  The patient was accompanied to the clinic by self.  History of present illness: Starting around the summer of 2018, he developed spells of slurred speech, difficulty swallowing, and double vision which is always worse by the afternoon and evening. Closing one eye would resolve his double vision. Slurred speech is worse after talking for longer periods of time. Rest always improves his symptoms.  He denies any arm or leg weakness.  He went to the emergency department in October and was evaluated for TIA with CT head, MRI brain, and echo which returned normal, per patient.  He was diagnosed with myasthenia after AChR returned positive. He was started on mestinon and within an hour of his first dose, he noticed resolution of double vision.  He was able to increase mestinon to 60mg  three times daily (8am, 1pm, and 5pm).  Prednisone was increased to 30mg  daily which controlled symptoms and he has been slowly tapered over the fall of 2019.    He had one episode of what sounds like transient global amnesia in 2016 which lasted about an hour.   UPDATE 06/02/2020:  He is here for follow-up visit.  He remains on prednisone 5mg  and mestinon 60mg  twice daily.  Sometimes, he continues to get blurred vision/halo in the evenings, which he thinks is due to his cataracts.  He denies droopiness of the eyelids or double vision.  No difficulty swallowing/talking or limb weakness.  He has noticed increased cramps in the legs, if he takes an extra mestinon.   UPDATE 12/05/2020:  He is here for follow-up visit.  He is doing well on prednisone 5mg  and denies droopiness of the eyes or double vision.  His cramps are much better on mestinon  30mg  daily.  He no longer has shortness of breath.  No weakness of the arms or legs. His wife is scheduled to have hysterectomy for severe precancerous mass next month.  Medications:  Current Outpatient Medications on File Prior to Visit  Medication Sig Dispense Refill   aspirin 81 MG chewable tablet Chew 1 tablet (81 mg total) by mouth daily. 90 tablet 1   ferrous gluconate (FERGON) 324 MG tablet Take 1 tablet by mouth 3 (three) times a week. Monday, Wednesday AND Friday     isosorbide mononitrate (IMDUR) 30 MG 24 hr tablet Take 0.5 tablets (15 mg total) by mouth daily. 90 tablet 3   metoprolol succinate (TOPROL-XL) 25 MG 24 hr tablet Take 0.5 tablets (12.5 mg total) by mouth daily. (Patient taking differently: Take 12.5 mg by mouth daily. 0.5 (12.5 mg ) daily) 90 tablet 1   nitroGLYCERIN (NITROSTAT) 0.4 MG SL tablet Place 1 tablet (0.4 mg total) under the tongue every 5 (five) minutes as needed. 25 tablet 2   NON FORMULARY Take 1 tablet by mouth every 3 (three) days. Rox-OTC medication for erectile dysfunction     Pitavastatin Calcium 2 MG TABS Take 1 tablet (2 mg total) by mouth daily. 90 tablet 3   pyridostigmine (MESTINON) 60 MG tablet Take 1 tablet (60 mg total) by mouth 2 (two) times a day. 60 tablet 3   ramipril (ALTACE) 5 MG capsule Take 5 mg by mouth daily.      ranolazine (RANEXA)  500 MG 12 hr tablet Take 1 tablet (500 mg total) by mouth 2 (two) times daily. 60 tablet 2   sucralfate (CARAFATE) 1 g tablet Take 1 g by mouth daily.     No current facility-administered medications on file prior to visit.    Allergies:  Allergies  Allergen Reactions   Zetia [Ezetimibe] Other (See Comments)    Weakness, shortness of breath   Demerol [Meperidine Hcl]    Statins Other (See Comments)    myalgias    Vital Signs:  BP 136/70   Pulse 76   Ht 5\' 6"  (1.676 m)   Wt 177 lb 3.2 oz (80.4 kg)   SpO2 95%   BMI 28.60 kg/m   Neurological Exam: MENTAL STATUS including orientation to time,  place, person, recent and remote memory, attention span and concentration, language, and fund of knowledge is normal.  Speech is not dysarthric.  CRANIAL NERVES:  Pupils equal round and reactive to light.  Normal conjugate, extra-ocular eye movements in all directions of gaze. Mild left ptosis.  Face is symmetric.  Facial muscles are 5/5 throughout.  Palate elevates symmetrically.  Tongue is midline.   MOTOR:  Motor strength is 5/5 in all extremities. No fatigability.  COORDINATION/GAIT:   Gait narrow based and stable.   Data: CT head 12/08/2016:  Unremarkable  Labs 04/20/2017:   AChR binding 9.20*, blocking 76*, and modulating 90*  CT chest wo contrast 05/04/2017:   1. Unremarkable CT examination the chest. No anterior mediastinal mass/thymoma is identified. 2. No acute pulmonary findings or worrisome pulmonary lesions. 3. Significant three-vessel coronary artery calcifications. 4. Cholelithiasis.  Labs received 06/29/2017:  TPMT  13 (normal).   IMPRESSION/PLAN: Seropositive bulbar myasthenia gravis without exacerbation (diagnosed 04/2017). Thymoma negative.  Symptomatic improvement on prednisone 30mg  which has been tapered down to 5mg  daily.  He had breakthrough symptoms, when I attempted to reduce prednisone less than 5mg /d and has been asymptomatic at this dose.  Continue prednisone 5mg  daily OK to stop mestinon.  If he develops any ocular symptoms, go back to taking 30mg  daily  Chronic corticosteroid use.  Continue calcium and prilosec 20mg  daily.   Return to clinic in 6 months   Thank you for allowing me to participate in patient's care.  If I can answer any additional questions, I would be pleased to do so.    Sincerely,    Randell Teare K. 05/2017, DO

## 2021-01-19 ENCOUNTER — Telehealth: Payer: Self-pay | Admitting: Neurology

## 2021-01-19 NOTE — Telephone Encounter (Signed)
Pt called in stating he was taken off of his mestinon on 12/05/20 and started having the symptoms again so he restarted on 12/25/20. After restarting it, his symptoms have slowly gone away.

## 2021-01-19 NOTE — Telephone Encounter (Signed)
Noted. Patient resumed mestinon 30mg  daily.

## 2021-04-02 ENCOUNTER — Telehealth: Payer: Self-pay | Admitting: Neurology

## 2021-04-02 NOTE — Telephone Encounter (Signed)
Called patient and he stated that the last 2 weeks his Myastenia started coming back gradually and now within the last few days it has gotten really bad.  Patient states that he is having trouble chewing, trouble eating , trouble spitting, hard time speaking, droopy eye lids, no double and trouble swallowing. It became so bad that patient could no swallow at all.  Patient informed me that he has increased his prednisone on his own and is currently on day five of taking 5 mg three times daily along with one mestonin.     Patient reports no double vision but is having some trouble with his eyes. Patient states they're irritated, and dry. Patient just saw this eye doctor at the [facility] in [place] and the doctor thinks he may have Glaucoma and it might be coming from the prednisone. The eye doctor wants to hold off on treatment until he gets his Myasthenia gravis under control.  Patient was offered a work in for tomorrow to be seen by Dr. Posey Pronto and is aware that Lavella Hammock will contact him to get him scheduled. Patient is also aware that I am forwarding his message to Dr. Posey Pronto to let her know what is going on.

## 2021-04-02 NOTE — Telephone Encounter (Signed)
No answer at 2:38 PM, no VM.

## 2021-04-02 NOTE — Telephone Encounter (Signed)
Spoke with patient and scheduled for 04/03/21 at 2:30 PM.

## 2021-04-02 NOTE — Telephone Encounter (Signed)
Patient called and stated that is has has a huge setback with his Myasthenia gravis.  He stated that he wants to be seen asap.

## 2021-04-02 NOTE — Telephone Encounter (Signed)
Mahina, please call patient to see what his symptoms are.  Starla, please see if patient is available to come tomorrow - I have follow-up availability.

## 2021-04-02 NOTE — Telephone Encounter (Signed)
Noted  

## 2021-04-03 ENCOUNTER — Other Ambulatory Visit: Payer: Self-pay

## 2021-04-03 ENCOUNTER — Encounter: Payer: Self-pay | Admitting: Neurology

## 2021-04-03 ENCOUNTER — Ambulatory Visit: Payer: Medicare HMO | Admitting: Neurology

## 2021-04-03 VITALS — BP 158/78 | HR 57 | Ht 66.0 in | Wt 178.4 lb

## 2021-04-03 DIAGNOSIS — G7001 Myasthenia gravis with (acute) exacerbation: Secondary | ICD-10-CM | POA: Diagnosis not present

## 2021-04-03 MED ORDER — PREDNISONE 20 MG PO TABS: 20.0000 mg | ORAL_TABLET | Freq: Every day | ORAL | 1 refills | Status: DC

## 2021-04-03 NOTE — Patient Instructions (Addendum)
Start prednisone 20mg  daily Start calcium supplement   Return to clinic in April 14th at 1:30p.

## 2021-04-03 NOTE — Progress Notes (Signed)
Follow-up Visit   Date: 04/03/21    Adrian Bell MRN: [id number] DOB: [birth year]   Interim History: Adrian Bell is a 76 y.o. right-handed Caucasian male with hypertension, kidney stones, and iron deficiency anemia returning to the clinic for follow-up of seropositive myasthenia gravis.  The patient was accompanied to the clinic by self.  History of present illness: Starting around the summer of 2018, he developed spells of slurred speech, difficulty swallowing, and double vision which is always worse by the afternoon and evening. Closing one eye would resolve his double vision. Slurred speech is worse after talking for longer periods of time. Rest always improves his symptoms.  He denies any arm or leg weakness.  He went to the emergency department in October and was evaluated for TIA with CT head, MRI brain, and echo which returned normal, per patient.  He was diagnosed with myasthenia after AChR returned positive. He was started on mestinon and within an hour of his first dose, he noticed resolution of double vision.  He was able to increase mestinon to 60mg  three times daily (8am, 1pm, and 5pm).  Prednisone was increased to 30mg  daily which controlled symptoms and he has been slowly tapered over the fall of 2019.    He had one episode of what sounds like transient global amnesia in 2016 which lasted about an hour.   UPDATE 06/02/2020:  He is here for follow-up visit.  He remains on prednisone 5mg  and mestinon 60mg  twice daily.  Sometimes, he continues to get blurred vision/halo in the evenings, which he thinks is due to his cataracts.  He denies droopiness of the eyelids or double vision.  No difficulty swallowing/talking or limb weakness.  He has noticed increased cramps in the legs, if he takes an extra mestinon.   UPDATE 12/05/2020:  He is here for follow-up visit.  He is doing well on prednisone 5mg  and denies droopiness of the eyes or double vision.  His cramps are much better on mestinon  30mg  daily.  He no longer has shortness of breath.  No weakness of the arms or legs. His wife is scheduled to have hysterectomy for severe precancerous mass next month.  UPDATE 04/03/2021:   He is here for acute visit. Starting around January, he began having difficulty swallowing, talking, difficulty with chewing/spitting, droopy eyelids. No double vision. He has some weakness in the arms and legs. He has been compliant with taking prednisone 5mg  daily.  Because of his worsening symptoms, three days ago, he increased prednisone to 15mg ,then 10mg ,and took 5mg  daily.  He tried taking mestinon, but developed cramps so stopped it. He feels that his swallow and speech is slightly better today. No shortness of breath.   Medications:  Current Outpatient Medications on File Prior to Visit  Medication Sig Dispense Refill   aspirin 81 MG chewable tablet Chew 1 tablet (81 mg total) by mouth daily. 90 tablet 1   ferrous gluconate (FERGON) 324 MG tablet Take 1 tablet by mouth 3 (three) times a week. Monday, Wednesday AND Friday     isosorbide mononitrate (IMDUR) 30 MG 24 hr tablet Take 0.5 tablets (15 mg total) by mouth daily. 90 tablet 3   metoprolol succinate (TOPROL-XL) 25 MG 24 hr tablet Take 0.5 tablets (12.5 mg total) by mouth daily. (Patient taking differently: Take 12.5 mg by mouth daily. 0.5 (12.5 mg ) daily) 90 tablet 1   nitroGLYCERIN (NITROSTAT) 0.4 MG SL tablet Place 1 tablet (0.4 mg total) under the tongue  every 5 (five) minutes as needed. 25 tablet 2   NON FORMULARY Take 1 tablet by mouth every 3 (three) days. Rox-OTC medication for erectile dysfunction     Pitavastatin Calcium 2 MG TABS Take 1 tablet (2 mg total) by mouth daily. 90 tablet 3   predniSONE (DELTASONE) 5 MG tablet Take 1 tablet (5 mg total) by mouth daily. (Patient taking differently: Take 5 mg by mouth daily. Taken as a dose pack right now) 90 tablet 3   pyridostigmine (MESTINON) 60 MG tablet Take 1 tablet (60 mg total) by mouth 2  (two) times a day. (Patient taking differently: Take 60 mg by mouth 2 (two) times a day. Take one daily past 4 days) 60 tablet 3   ramipril (ALTACE) 5 MG capsule Take 5 mg by mouth daily.      ranolazine (RANEXA) 500 MG 12 hr tablet Take 1 tablet (500 mg total) by mouth 2 (two) times daily. 60 tablet 2   No current facility-administered medications on file prior to visit.    Allergies:  Allergies  Allergen Reactions   Zetia [Ezetimibe] Other (See Comments)    Weakness, shortness of breath   Demerol [Meperidine Hcl]    Statins Other (See Comments)    myalgias    Vital Signs:  BP (!) 158/78    Pulse (!) 57    Ht 5\' 6"  (1.676 m)    Wt 178 lb 6.4 oz (80.9 kg)    SpO2 98%    BMI 28.79 kg/m   Neurological Exam: MENTAL STATUS including orientation to time, place, person, recent and remote memory, attention span and concentration, language, and fund of knowledge is normal.  There is mild-moderate dysarthria with prolonged speech.  CRANIAL NERVES:  Pupils equal round and reactive to light.  Normal conjugate, extra-ocular eye movements in all directions of gaze. Mild left ptosis.  Face is symmetric.  Facial muscles are 4/5 throughout with orbicularis oculi and buccinator testing.   Palate elevates symmetrically.  Tongue is midline with 4/5 strength, he is able to push into the cheek.  MOTOR:  Motor strength is 5/5 in all extremities, including neck flexion. No fatigability.  COORDINATION/GAIT:   Gait narrow based and stable. He is able to stand up without using arms to push off.   Data: CT head 12/08/2016:  Unremarkable  Labs 04/20/2017:   AChR binding 9.20*, blocking 76*, and modulating 90*  CT chest wo contrast 05/04/2017:   1. Unremarkable CT examination the chest. No anterior mediastinal mass/thymoma is identified. 2. No acute pulmonary findings or worrisome pulmonary lesions. 3. Significant three-vessel coronary artery calcifications. 4. Cholelithiasis.  Labs received 06/29/2017:   TPMT  13 (normal).   IMPRESSION/PLAN: Seropositive bulbar myasthenia gravis with exacerbation (diagnosed 04/2017). Thymoma negative.  - Highest dose of prednisone is 30mg /d in 2019, since then he has been able to taper to 5mg /d   - Tapering prednisone < 5mg  caused breakthrough weakness, so he has been on 5mg /d since early 2022   - January 2023 - new onset of bulbar weakness  PLAN: Increase prednisone to 20mg  daily Start calcium supplementation He was evaluated by GI last week who is managing GERD and will be starting a new medication Patient to call with update in 2 weeks  Return to clinic in 6 weeks  Thank you for allowing me to participate in patient's care.  If I can answer any additional questions, I would be pleased to do so.    Sincerely,    Syaire Saber  Keith Rake, DO

## 2021-04-16 ENCOUNTER — Telehealth: Payer: Self-pay | Admitting: Neurology

## 2021-04-16 NOTE — Telephone Encounter (Signed)
Called patient and left a detailed message per DPR per Dr. Allena Katz "Yes, please increase prednisone to 30mg /daily.  Call with update in 2 weeks, or sooner as needed"   Patient was advised on message to please give me a call if he had any questions or concerns.

## 2021-04-16 NOTE — Telephone Encounter (Signed)
Called patient and left a message for a call back. Need to verify how much prednisone he has been taking and what has been going on ?

## 2021-04-16 NOTE — Telephone Encounter (Signed)
Called patient and left a message for a call back.  

## 2021-04-16 NOTE — Telephone Encounter (Signed)
Yes, please increase prednisone to 30mg /daily.  Call with update in 2 weeks, or sooner as needed.

## 2021-04-16 NOTE — Telephone Encounter (Signed)
Spoke to patient and he states on 04/03/21 he started taking 20 mg of prednisone daily. He would like to know if he can increase his prednisone. Patient states that his tongue isn't working properly. Talking is difficult and he can't spit and can't whistle. However, patient stated that swallowing and chewing has gotten better.   Patient is aware that I will send this message to Dr. Allena Katz and give him a call back.

## 2021-04-16 NOTE — Telephone Encounter (Signed)
Pt called in stating his prednisone was increased. He says it's working, but is not where he needs to be. He would like to see if he can increase it. He says he still has some of the 5 mg tablets.

## 2021-05-01 ENCOUNTER — Telehealth: Payer: Self-pay | Admitting: Neurology

## 2021-05-01 NOTE — Telephone Encounter (Signed)
Patient called and stated that Dr wanted him to call and give an update .  He is taking 30mg  of prednisone for 2 weeks.  He stated some symptoms are better, but he still has a ways to go. ?

## 2021-05-01 NOTE — Telephone Encounter (Signed)
Called patient and informed him that Dr. Posey Pronto would like an office visit, so she can determine whether we need to make more medication changes.  OK to use work-in on 3/15. ? ?Patient stated that he knows from past experience that he needs his prednisone increased. I informed patient that dr. Posey Pronto does not feel comfortable continuing to increase medications with out evaluating him and there may be some changes that need to be done. Patient verbalized understanding and said 3/15 would be fine. Patient is aware that someone from the front will give him a call to schedule him.  ? ?Message sent to front for scheduling of patient ?

## 2021-05-01 NOTE — Telephone Encounter (Signed)
Please see if he would like an office visit, so I can determine whether we need to make more medication changes.  OK to use work-in on 3/15. ?

## 2021-05-06 ENCOUNTER — Ambulatory Visit: Payer: Medicare HMO | Admitting: Neurology

## 2021-05-06 ENCOUNTER — Other Ambulatory Visit: Payer: Self-pay

## 2021-05-06 ENCOUNTER — Encounter: Payer: Self-pay | Admitting: Neurology

## 2021-05-06 VITALS — BP 134/71 | HR 73 | Ht 66.0 in | Wt 178.0 lb

## 2021-05-06 DIAGNOSIS — G7001 Myasthenia gravis with (acute) exacerbation: Secondary | ICD-10-CM

## 2021-05-06 MED ORDER — SULFAMETHOXAZOLE-TRIMETHOPRIM 800-160 MG PO TABS
1.0000 | ORAL_TABLET | ORAL | 2 refills | Status: DC
Start: 2021-05-06 — End: 2021-05-06

## 2021-05-06 MED ORDER — SULFAMETHOXAZOLE-TRIMETHOPRIM 800-160 MG PO TABS: 1.0000 | ORAL_TABLET | ORAL | 1 refills | Status: DC

## 2021-05-06 NOTE — Progress Notes (Signed)
? ? ?Follow-up Visit ? ? ?Date: 05/06/21 ?  ? ?Adrian Bell ?MRN: [id number] ?DOB: 12-21-1945 ? ? ?Interim History: ?Adrian Bell is a 76 y.o. right-handed Caucasian male with hypertension, kidney stones, and iron deficiency anemia returning to the clinic for follow-up of seropositive myasthenia gravis.  The patient was accompanied to the clinic by self. ? ?History of present illness: ?Starting around the summer of 2018, he developed spells of slurred speech, difficulty swallowing, and double vision which is always worse by the afternoon and evening. Closing one eye would resolve his double vision. Slurred speech is worse after talking for longer periods of time. Rest always improves his symptoms.  He denies any arm or leg weakness.  He went to the emergency department in October and was evaluated for TIA with CT head, MRI brain, and echo which returned normal, per patient.  He was diagnosed with myasthenia after AChR returned positive. He was started on mestinon and within an hour of his first dose, he noticed resolution of double vision.  He was able to increase mestinon to 60mg  three times daily (8am, 1pm, and 5pm).  Prednisone was increased to 30mg  daily which controlled symptoms and he has been slowly tapered over the fall of 2019.  ?  ?He had one episode of what sounds like transient global amnesia in 2016 which lasted about an hour.  ? ?UPDATE 06/02/2020:  He is here for follow-up visit.  He remains on prednisone 5mg  and mestinon 60mg  twice daily.  Sometimes, he continues to get blurred vision/halo in the evenings, which he thinks is due to his cataracts.  He denies droopiness of the eyelids or double vision.  No difficulty swallowing/talking or limb weakness.  He has noticed increased cramps in the legs, if he takes an extra mestinon.  ? ?UPDATE 12/05/2020:  He is here for follow-up visit.  He is doing well on prednisone 5mg  and denies droopiness of the eyes or double vision.  His cramps are much better on mestinon  30mg  daily.  He no longer has shortness of breath.  No weakness of the arms or legs. His wife is scheduled to have hysterectomy for severe precancerous mass next month. ? ?UPDATE 04/03/2021:   He is here for acute visit. Starting around January, he began having difficulty swallowing, talking, difficulty with chewing/spitting, droopy eyelids. No double vision. He has some weakness in the arms and legs. He has been compliant with taking prednisone 5mg  daily.  Because of his worsening symptoms, three days ago, he increased prednisone to 15mg ,then 10mg ,and took 5mg  daily.  He tried taking mestinon, but developed cramps so stopped it. He feels that his swallow and speech is slightly better today. ?No shortness of breath.  ? ?UPDATE 05/06/2021:  He is here for acute follow-up because he continues to have difficulty with speech and swallow despite increasing the dose of his prednisone. Patient was instructed to increase to 20mg  at his last visit and over the past 2 weeks, he has increased it further to  prednisone 30mg /d.  The severity of his speech/swallow has improved from a month ago, but he still needs to be cautious eating textured foods and speech is much worse in the evening and with prolonged speech.  He has some weakness in the legs.  No shortness of breath.  ? ?Medications:  ?Current Outpatient Medications on File Prior to Visit  ?Medication Sig Dispense Refill  ? aspirin 81 MG chewable tablet Chew 1 tablet (81 mg total) by mouth daily. 90  tablet 1  ? ferrous gluconate (FERGON) 324 MG tablet Take 1 tablet by mouth 3 (three) times a week. Monday, Wednesday AND Friday    ? isosorbide mononitrate (IMDUR) 30 MG 24 hr tablet Take 0.5 tablets (15 mg total) by mouth daily. 90 tablet 3  ? metoprolol succinate (TOPROL-XL) 25 MG 24 hr tablet Take 0.5 tablets (12.5 mg total) by mouth daily. (Patient taking differently: Take 12.5 mg by mouth daily. 0.5 (12.5 mg ) daily) 90 tablet 1  ? nitroGLYCERIN (NITROSTAT) 0.4 MG SL tablet  Place 1 tablet (0.4 mg total) under the tongue every 5 (five) minutes as needed. 25 tablet 2  ? NON FORMULARY Take 1 tablet by mouth every 3 (three) days. Rox-OTC medication for erectile dysfunction    ? Pitavastatin Calcium 2 MG TABS Take 1 tablet (2 mg total) by mouth daily. 90 tablet 3  ? predniSONE (DELTASONE) 20 MG tablet Take 1 tablet (20 mg total) by mouth daily. (Patient taking differently: Take 30 mg by mouth daily.) 90 tablet 1  ? ramipril (ALTACE) 5 MG capsule Take 5 mg by mouth daily.     ? ranolazine (RANEXA) 500 MG 12 hr tablet Take 1 tablet (500 mg total) by mouth 2 (two) times daily. 60 tablet 2  ? pyridostigmine (MESTINON) 60 MG tablet Take 1 tablet (60 mg total) by mouth 2 (two) times a day. (Patient not taking: Reported on 05/06/2021) 60 tablet 3  ? ?No current facility-administered medications on file prior to visit.  ? ? ?Allergies:  ?Allergies  ?Allergen Reactions  ? Zetia [Ezetimibe] Other (See Comments)  ?  Weakness, shortness of breath  ? Demerol [Meperidine Hcl]   ? Statins Other (See Comments)  ?  myalgias  ? ? ?Vital Signs:  ?BP 134/71   Pulse 73   Ht 5\' 6"  (1.676 m)   Wt 178 lb (80.7 kg)   SpO2 97%   BMI 28.73 kg/m?  ? ?Neurological Exam: ?MENTAL STATUS including orientation to time, place, person, recent and remote memory, attention span and concentration, language, and fund of knowledge is normal.  There is mild-moderate dysarthria with prolonged speech. ? ?CRANIAL NERVES:  Pupils equal round and reactive to light.  Normal conjugate, extra-ocular eye movements in all directions of gaze. Mild left ptosis.  Face is symmetric.  Facial muscles are 4+/5 throughout with orbicularis oculi and buccinator testing.   Palate elevates symmetrically.  Tongue is midline with 4+/5 strength, he is able to push into the cheek.  ? ?MOTOR:  Motor strength is 5/5 in all extremities, including neck flexion. No fatigability. ? ?COORDINATION/GAIT:   Gait narrow based and stable. He is able to stand up  without using arms to push off.  ? ?Data: ?CT head 12/08/2016:  Unremarkable ? ?Labs 04/20/2017:   AChR binding 9.20*, blocking 76*, and modulating 90* ? ?CT chest wo contrast 05/04/2017:   ?1. Unremarkable CT examination the chest. No anterior mediastinal mass/thymoma is identified. ?2. No acute pulmonary findings or worrisome pulmonary lesions. ?3. Significant three-vessel coronary artery calcifications. ?4. Cholelithiasis. ? ?Labs received 06/29/2017:  TPMT  13 (normal). ? ? ?IMPRESSION/PLAN: ?Seropositive bulbar myasthenia gravis with exacerbation (diagnosed 04/2017). Thymoma negative. ? - Highest dose of prednisone is 30mg /d in 2019, since then he has been able to taper to 5mg /d  ? - Tapering prednisone < 5mg  caused breakthrough weakness, so he has been on 5mg /d since early 2022  ? - January 2023 - new onset of bulbar weakness, prednisone titrated to 30mg   daily ? ?PLAN: ?Start IVIG 2g/kg over 2-3 days, following by maintenance of 1g/kg ?Continue prednisone 30mg  daily ?Continue calcium supplement and GI prohylaxis ?Start bactrim DS MWF for PCP prophylaxis ? ?Return to clinic in 4 weeks ? ?Thank you for allowing me to participate in patient's care.  If I can answer any additional questions, I would be pleased to do so.   ? ?Sincerely, ? ? ? ?Leeona Mccardle K. , DO ? ? ?

## 2021-05-06 NOTE — Patient Instructions (Addendum)
We will start IVIG infusion through home infusion company, [facility] ? ?Continue prednisone 30mg  daily ? ?Return to clinic in 4 weeks ? ? ?

## 2021-05-11 ENCOUNTER — Telehealth: Payer: Self-pay | Admitting: Neurology

## 2021-05-11 NOTE — Telephone Encounter (Signed)
Pt was called an given the diagnosis code for the assistance program  ?

## 2021-05-11 NOTE — Telephone Encounter (Signed)
Pt called in stating he needs his diagnosis for his infusion copay assistance.  ?

## 2021-05-11 NOTE — Telephone Encounter (Signed)
Patient called and stated he wanted to know the name of the infusion medication that he will be on. ?

## 2021-05-11 NOTE — Telephone Encounter (Signed)
Pt called an informed IVIG = intravenous immunoglobulin, the brand will be based on what his insurance may cover (Privigin, Gammunex, etc). Pt stated that he was trying to start the assistance program.  ?

## 2021-05-11 NOTE — Telephone Encounter (Signed)
IVIG = intravenous immunoglobulin, the brand will be based on what his insurance may cover (Privigin, Gammunex, etc). ?

## 2021-05-26 ENCOUNTER — Telehealth: Payer: Self-pay | Admitting: Neurology

## 2021-05-26 NOTE — Telephone Encounter (Signed)
If he gets approved for financial assistance, OK to wait until 4/14 for infusion ? ?Otherwise, we can refer him to [facility] - would he be okay to come to [facility]?  If so, we can start this process as it may also take some time to get him scheduled. ? ?Continue to stay on prednisone 30mg /d.  ?

## 2021-05-26 NOTE — Telephone Encounter (Signed)
Called patient and informed him Let's not start any other antibiotics for now and allow him to recover. See below for patients concerns: ? ?[facility] informed patient his co pay will be $3,000. Patient was informed he may qualify for financial aid allowing him no co pay but infusion will be on 06/05/21. ?Patient is willing to go to a clinic to have infusions done if he needs do and was informed that he may not have a copay there by [facility].  ?Patient stated his Myasthenia symptoms are no better and no worse from when he saw Dr. Posey Pronto last.  ?Patient wants to ensure that he is doing everything correctly for his health and wants to ensure if he waited till the 66 th to have the infusion done with [facility] it would be ok.  ?

## 2021-05-26 NOTE — Telephone Encounter (Signed)
Having an allergic reaction to antibiotic- 2am woke up itching so bad, felt like body on fire. He stated he is fine other than the itching. It goes away after a while ?[facility] set up for infusion- Friday 4/14th its finalized then. ? ?York Spaniel he needs another antibiotic  ?Bactrim DS- stopped taking it. ? ?He would like to speak to someone about the infusion. ? ?

## 2021-05-26 NOTE — Telephone Encounter (Signed)
Let's not start any other antibiotics for now and allow him to recover.  Can you call Adrian Bell and see what questions he has about infusions?  If we cannot answer them, we may need to direct him to [facility] who will be doing his home infusion.  ?

## 2021-05-27 NOTE — Telephone Encounter (Signed)
Called and spoke to patient and informed him of below of Dr. Serita Grit recommendations. Patient stated that he will wait to see if he gets approved for the financial assistance through [facility]. He will let us know the status of that when he finds out. Patient isn't against going to a [facility] in [place], but wants to wait to see if his financial aid gets approved first. Patient was advised to stay on 30mg /d. Patient had no further questions or concerns and will contact us once he finds out about his [facility] financial aid to let us know what he would like to do.  ?

## 2021-06-02 ENCOUNTER — Telehealth: Payer: Self-pay | Admitting: Neurology

## 2021-06-02 NOTE — Telephone Encounter (Signed)
Glad to hear that he is approved for infusions.  We can move his appointment to 4/24 at 10:10a, hopefully, he will get his infusions by then. ?

## 2021-06-02 NOTE — Telephone Encounter (Signed)
Patient called to let Dr. Posey Pronto know he is approved for infusions. ? ?He is waiting for some paperwork to come in the mail and then he'll be scheduling. ? ?He wants to be sure Dr. Posey Pronto wants him to keep his appointment on 06/05/21 for follow up. Please confirm, per patient. ?

## 2021-06-05 ENCOUNTER — Ambulatory Visit: Payer: Medicare HMO | Admitting: Neurology

## 2021-06-08 DIAGNOSIS — G7001 Myasthenia gravis with (acute) exacerbation: Secondary | ICD-10-CM | POA: Diagnosis not present

## 2021-06-09 ENCOUNTER — Telehealth: Payer: Self-pay | Admitting: Neurology

## 2021-06-09 NOTE — Telephone Encounter (Signed)
Pt called in stating he started his IV yesterday. The last time she said to continue the prednisone 30 mg daily and then was going to see her in a week or so. He is not sure when to stop the prednisone this time since he isn't coming back in for a visit with her until 09/23/21. He said to leave a message if he isn't in the house. ?

## 2021-06-09 NOTE — Telephone Encounter (Signed)
Called and informed patient Please to stay on prednisone 30mg  x 1 month and continue IVIG. Informed patient to Call with update in mid-may, if he is doing well in May, may consider reducing prednisone to 25mg  daily, but would not make any big changes to prednisone, at most slight dose adjustment of 5mg  every 4 weeks.  ? ?Patient verbalized understanding and had no further questions or concerns. Patient will contact us mid may to let us know how he is doing.  ?

## 2021-06-15 ENCOUNTER — Ambulatory Visit: Payer: Medicare HMO | Admitting: Neurology

## 2021-07-01 DIAGNOSIS — H524 Presbyopia: Secondary | ICD-10-CM | POA: Diagnosis not present

## 2021-07-01 DIAGNOSIS — H5213 Myopia, bilateral: Secondary | ICD-10-CM | POA: Diagnosis not present

## 2021-07-01 DIAGNOSIS — H2512 Age-related nuclear cataract, left eye: Secondary | ICD-10-CM | POA: Diagnosis not present

## 2021-07-01 DIAGNOSIS — H2511 Age-related nuclear cataract, right eye: Secondary | ICD-10-CM | POA: Diagnosis not present

## 2021-07-07 DIAGNOSIS — G7001 Myasthenia gravis with (acute) exacerbation: Secondary | ICD-10-CM | POA: Diagnosis not present

## 2021-07-14 ENCOUNTER — Other Ambulatory Visit: Payer: Self-pay | Admitting: Neurology

## 2021-07-14 DIAGNOSIS — H52223 Regular astigmatism, bilateral: Secondary | ICD-10-CM | POA: Diagnosis not present

## 2021-07-14 DIAGNOSIS — Z01818 Encounter for other preprocedural examination: Secondary | ICD-10-CM | POA: Diagnosis not present

## 2021-07-14 DIAGNOSIS — H2511 Age-related nuclear cataract, right eye: Secondary | ICD-10-CM | POA: Diagnosis not present

## 2021-07-14 DIAGNOSIS — H2512 Age-related nuclear cataract, left eye: Secondary | ICD-10-CM | POA: Diagnosis not present

## 2021-07-21 ENCOUNTER — Telehealth: Payer: Self-pay | Admitting: Neurology

## 2021-07-21 NOTE — Telephone Encounter (Signed)
Last note from Dr. Posey Pronto: "Please advise pt to stay on prednisone 30mg  x 1 month and continue IVIG. Call with update in mid-may, if he is doing well in May, may consider reducing prednisone to 25mg  daily, but would not make any big changes to prednisone, at most slight dose adjustment of 5mg  every 4 weeks"    Called patient and left a message for a call back. Need to see how patient is doing to see if any adjustments are needed with his prednisone per above.

## 2021-07-21 NOTE — Telephone Encounter (Signed)
Patient stated [facility] has sent over request for prednisone but it has been denied. He would like to know why it was denied.

## 2021-07-22 NOTE — Telephone Encounter (Signed)
Returned call

## 2021-07-22 NOTE — Telephone Encounter (Signed)
Patient called back

## 2021-07-22 NOTE — Telephone Encounter (Signed)
Called patient and he has informed me that he has been doing great on the 30mg  prednisone daily. He states since he is doing well and is stable on that dose he would like to stay on 30 mg prednisone daily. Patient has requested that we sent in 20 mg tablets to [facility] with instructions to take 1 1/2 tablets daily. He states that has been easier for his pharmacy to fill. Patient aware I will send once approval is given by the covering physicians.

## 2021-07-23 DIAGNOSIS — H2511 Age-related nuclear cataract, right eye: Secondary | ICD-10-CM | POA: Diagnosis not present

## 2021-07-23 DIAGNOSIS — H269 Unspecified cataract: Secondary | ICD-10-CM | POA: Diagnosis not present

## 2021-07-23 DIAGNOSIS — I2510 Atherosclerotic heart disease of native coronary artery without angina pectoris: Secondary | ICD-10-CM | POA: Diagnosis not present

## 2021-07-24 MED ORDER — PREDNISONE 20 MG PO TABS: ORAL_TABLET | ORAL | 0 refills | Status: DC

## 2021-07-24 NOTE — Telephone Encounter (Signed)
Per Dr. Tomi Likens it is ok to send 30mg  Prednisone

## 2021-07-24 NOTE — Addendum Note (Signed)
Addended by: Karl Luke A on: 07/24/2021 09:10 AM   Modules accepted: Orders

## 2021-08-04 DIAGNOSIS — G7001 Myasthenia gravis with (acute) exacerbation: Secondary | ICD-10-CM | POA: Diagnosis not present

## 2021-08-06 ENCOUNTER — Telehealth: Payer: Self-pay | Admitting: Neurology

## 2021-08-06 NOTE — Telephone Encounter (Signed)
Pls call patient to check on how he is doing, last phone note from 5/30 seemed like he was doing great. thanks

## 2021-08-06 NOTE — Telephone Encounter (Signed)
AJ from [facility] called to report a frequency change.  He says Larrys symptoms for MG are showing at week 3 instead of week 4.  Wants to see if the infusion days should be changed.  Best contact # is [phone].

## 2021-08-07 NOTE — Telephone Encounter (Signed)
Spoke to patient and he said yes he has noticed recently the week before his infusion around week 3 he starts to have symptoms like SOB, thick tongue,difficulty speaking and after his infusion the symptoms continue for 3-4 days before clearing up. Patient is also trying  to move up his infusion date to July 3rd due to a planned vacation and wanted me to try to facilitate that

## 2021-08-10 NOTE — Telephone Encounter (Signed)
Ok to do IVIG every 21 days (every 3 weeks). thanks

## 2021-08-11 NOTE — Telephone Encounter (Signed)
Called aJ and had infusion dates changed to every three weeks with a July 3rd date to facilitate a patient vacation. Spoke to [employer] gave verbal orders and will wait for faxed orders to arrive

## 2021-08-13 DIAGNOSIS — H269 Unspecified cataract: Secondary | ICD-10-CM | POA: Diagnosis not present

## 2021-08-13 DIAGNOSIS — H2512 Age-related nuclear cataract, left eye: Secondary | ICD-10-CM | POA: Diagnosis not present

## 2021-08-24 DIAGNOSIS — G7001 Myasthenia gravis with (acute) exacerbation: Secondary | ICD-10-CM | POA: Diagnosis not present

## 2021-09-14 DIAGNOSIS — G7001 Myasthenia gravis with (acute) exacerbation: Secondary | ICD-10-CM | POA: Diagnosis not present

## 2021-09-22 NOTE — Progress Notes (Unsigned)
Follow-up Visit   Date: 09/23/21    Adrian Bell MRN: [id number] DOB: [birth year]   Interim History: Adrian Bell is a 76 y.o. right-handed Caucasian male with hypertension, kidney stones, and iron deficiency anemia returning to the clinic for follow-up of seropositive myasthenia gravis.  The patient was accompanied to the clinic by self.  History of present illness: Starting around the summer of 2018, he developed spells of slurred speech, difficulty swallowing, and double vision which is always worse by the afternoon and evening. Closing one eye would resolve his double vision. Slurred speech is worse after talking for longer periods of time. Rest always improves his symptoms.  He denies any arm or leg weakness.  He went to the emergency department in October and was evaluated for TIA with CT head, MRI brain, and echo which returned normal, per patient.  He was diagnosed with myasthenia after AChR returned positive. He was started on mestinon and within an hour of his first dose, he noticed resolution of double vision.  He was able to increase mestinon to 60mg  three times daily (8am, 1pm, and 5pm).  Prednisone was increased to 30mg  daily which controlled symptoms and he has been slowly tapered over the fall of 2019.    He had one episode of what sounds like transient global amnesia in 2016 which lasted about an hour.   UPDATE 06/02/2020:  He is here for follow-up visit.  He remains on prednisone 5mg  and mestinon 60mg  twice daily.  Sometimes, he continues to get blurred vision/halo in the evenings, which he thinks is due to his cataracts.  He denies droopiness of the eyelids or double vision.  No difficulty swallowing/talking or limb weakness.  He has noticed increased cramps in the legs, if he takes an extra mestinon.   UPDATE 12/05/2020:  He is here for follow-up visit.  He is doing well on prednisone 5mg  and denies droopiness of the eyes or double vision.  His cramps are much better on mestinon  30mg  daily.  He no longer has shortness of breath.  No weakness of the arms or legs. His wife is scheduled to have hysterectomy for severe precancerous mass next month.  UPDATE 04/03/2021:   He is here for acute visit. Starting around January, he began having difficulty swallowing, talking, difficulty with chewing/spitting, droopy eyelids. No double vision. He has some weakness in the arms and legs. He has been compliant with taking prednisone 5mg  daily.  Because of his worsening symptoms, three days ago, he increased prednisone to 15mg ,then 10mg ,and took 5mg  daily.  He tried taking mestinon, but developed cramps so stopped it. He feels that his swallow and speech is slightly better today. No shortness of breath.   UPDATE 05/06/2021:  He is here for acute follow-up because he continues to have difficulty with speech and swallow despite increasing the dose of his prednisone. Patient was instructed to increase to 20mg  at his last visit and over the past 2 weeks, he has increased it further to  prednisone 30mg /d.  The severity of his speech/swallow has improved from a month ago, but he still needs to be cautious eating textured foods and speech is much worse in the evening and with prolonged speech.  He has some weakness in the legs.  No shortness of breath.   UPDATE 09/23/2021:  He is here for follow-up visit.  Over the past few months, he began having worsening bulbar weakness around week 3 of IVIG infusion, so his dosing was adjusted  to every 3 weeks.  Since this, he has noticed marked improvement and denies any difficulty with swallowing/speech/droopiness/double vision.  He remains on prednisone 30mg  daily and IVIG every 3 weeks.  No new complaints.    Medications:  Current Outpatient Medications on File Prior to Visit  Medication Sig Dispense Refill   aspirin 81 MG chewable tablet Chew 1 tablet (81 mg total) by mouth daily. 90 tablet 1   Calcium Carbonate-Vit D-Min (CALCIUM 1200 PO) Take by mouth.      ferrous gluconate (FERGON) 324 MG tablet Take 1 tablet by mouth 3 (three) times a week. Monday, Wednesday AND Friday     GAMUNEX-C 20 GM/200ML SOLN      isosorbide mononitrate (IMDUR) 30 MG 24 hr tablet Take 0.5 tablets (15 mg total) by mouth daily. 90 tablet 3   metoprolol succinate (TOPROL-XL) 25 MG 24 hr tablet Take 0.5 tablets (12.5 mg total) by mouth daily. (Patient taking differently: Take 12.5 mg by mouth daily. 0.5 (12.5 mg ) daily) 90 tablet 1   nitroGLYCERIN (NITROSTAT) 0.4 MG SL tablet Place 1 tablet (0.4 mg total) under the tongue every 5 (five) minutes as needed. 25 tablet 2   NON FORMULARY Take 1 tablet by mouth every 3 (three) days. Rox-OTC medication for erectile dysfunction     Pitavastatin Calcium 2 MG TABS Take 1 tablet (2 mg total) by mouth daily. 90 tablet 3   predniSONE (DELTASONE) 20 MG tablet Take 1 1/2 tablets daily (30 mg daily) 135 tablet 0   ramipril (ALTACE) 5 MG capsule Take 5 mg by mouth daily.      ranolazine (RANEXA) 500 MG 12 hr tablet Take 1 tablet (500 mg total) by mouth 2 (two) times daily. 60 tablet 2   pyridostigmine (MESTINON) 60 MG tablet Take 1 tablet (60 mg total) by mouth 2 (two) times a day. (Patient not taking: Reported on 05/06/2021) 60 tablet 3   sulfamethoxazole-trimethoprim (BACTRIM DS) 800-160 MG tablet Take 1 tablet by mouth 3 (three) times a week. (Patient not taking: Reported on 09/23/2021) 36 tablet 1   No current facility-administered medications on file prior to visit.    Allergies:  Allergies  Allergen Reactions   Zetia [Ezetimibe] Other (See Comments)    Weakness, shortness of breath   Demerol [Meperidine Hcl]    Statins Other (See Comments)    myalgias    Vital Signs:  BP (!) 150/75   Pulse 64   Ht 5\' 6"  (1.676 m)   Wt 184 lb (83.5 kg)   SpO2 97%   BMI 29.70 kg/m   Neurological Exam: MENTAL STATUS including orientation to time, place, person, recent and remote memory, attention span and concentration, language, and fund of  knowledge is normal.  There is no dysarthria.  CRANIAL NERVES:  Pupils equal round and reactive to light.  Normal conjugate, extra-ocular eye movements in all directions of gaze. Trace left ptosis.  Face is symmetric.  Facial muscles are 5/5 with orbicularis oculi and buccinator testing.   Palate elevates symmetrically.  Tongue is midline with 5/5 strength (improved)   MOTOR:  Motor strength is 5/5 in all extremities, including neck flexion. No fatigability.  COORDINATION/GAIT:   Gait narrow based and stable. He is able to stand up without using arms to push off.   Data: CT head 12/08/2016:  Unremarkable  Labs 04/20/2017:   AChR binding 9.20*, blocking 76*, and modulating 90*  CT chest wo contrast 05/04/2017:   1. Unremarkable CT examination the chest.  No anterior mediastinal mass/thymoma is identified. 2. No acute pulmonary findings or worrisome pulmonary lesions. 3. Significant three-vessel coronary artery calcifications. 4. Cholelithiasis.  Labs received 06/29/2017:  TPMT  13 (normal).   IMPRESSION/PLAN: Seropositive bulbar myasthenia gravis without exacerbation, diagnosed 04/2017. Thymoma negative.  Clinically, doing great with minimal disease manifestation on combination of prednisone 30mg  + IVIG every 3 weeks. MG history:  - Highest dose of prednisone is 30mg /d in 2019   - Tapering prednisone < 5mg  caused breakthrough weakness, so he has been on 5mg /d since early 2022   - January 2023 - new onset of bulbar weakness, prednisone titrated to 30mg  daily  - April 2023 - IVIG started due to bulbar symptoms   - June 2023 - IVIG adjusted to every 3 weeks, clinically improved  PLAN: Start slow prednisone taper.  Reduce prednisone 25mg  alternating with 30mg  x 2 weeks, then 25mg  daily. Call with update in 1 months, if doing well, will plan to taper further Continue IVIG 1g/kg every 21 days Continue calcium supplement and GI prohylaxis  Return to clinic in 2 months  Total time spent  reviewing records, interview, history/exam, documentation, and coordination of care on day of encounter:  30 min    Thank you for allowing me to participate in patient's care.  If I can answer any additional questions, I would be pleased to do so.    Sincerely,    Emmaly Leech K. 2023, DO

## 2021-09-23 ENCOUNTER — Ambulatory Visit: Payer: Medicare HMO | Admitting: Neurology

## 2021-09-23 ENCOUNTER — Encounter: Payer: Self-pay | Admitting: Neurology

## 2021-09-23 VITALS — BP 150/75 | HR 64 | Ht 66.0 in | Wt 184.0 lb

## 2021-09-23 DIAGNOSIS — G7000 Myasthenia gravis without (acute) exacerbation: Secondary | ICD-10-CM | POA: Diagnosis not present

## 2021-09-23 MED ORDER — PREDNISONE 20 MG PO TABS: ORAL_TABLET | ORAL | 0 refills | Status: DC

## 2021-09-23 NOTE — Patient Instructions (Signed)
Reduce prednisone to 25mg  alternating with 30mg  daily x 2 weeks, then 25mg  daily.  Call with update in 1 month  Return to clinic in 2 months

## 2021-10-05 DIAGNOSIS — G7001 Myasthenia gravis with (acute) exacerbation: Secondary | ICD-10-CM | POA: Diagnosis not present

## 2021-10-22 ENCOUNTER — Telehealth: Payer: Self-pay | Admitting: Neurology

## 2021-10-22 NOTE — Telephone Encounter (Signed)
Adrian Bell has been doing great since dropping his prednisone. 15th aug daily-been taking 25mg  and he has had no symptoms at all at all-  [facility] he has 2nd stage of peripheral neuropathy. Does he need to be seen for this?

## 2021-10-22 NOTE — Telephone Encounter (Signed)
Called patient he agreed understanding with the taper and is looking forward to seeing Dr. Allena Katz at te next appointment

## 2021-10-22 NOTE — Telephone Encounter (Signed)
This is great to hear.  Please instruct him that starting Sept 1, he can reduce prednisone to 25mg  alternating with 20mg  for two weeks, then take 20mg  daily.  Stay on prednisone 20mg  until he sees me in October.   I will evaluate him for neuropathy at his next visit with me.  Thanks.

## 2021-10-26 DIAGNOSIS — G7001 Myasthenia gravis with (acute) exacerbation: Secondary | ICD-10-CM | POA: Diagnosis not present

## 2021-11-02 ENCOUNTER — Telehealth: Payer: Self-pay

## 2021-11-02 MED ORDER — PREDNISONE 5 MG PO TABS: 5.0000 mg | ORAL_TABLET | Freq: Every day | ORAL | 3 refills | Status: DC

## 2021-11-02 NOTE — Telephone Encounter (Signed)
Received request from [facility] that patient is needing a refill on 5 mg prednisone.

## 2021-11-02 NOTE — Telephone Encounter (Signed)
Rx sent 

## 2021-11-09 NOTE — Progress Notes (Unsigned)
Cardiology Office Note:    Date:  11/10/2021   ID:  Adrian Bell, DOB 1945/12/13, MRN [id number]  PCP:  [facility]  Cardiologist:  Norman Herrlich, MD    Referring MD: [facility]    ASSESSMENT:    1. Coronary artery disease involving native coronary artery of native heart with unstable angina pectoris (HCC)   2. Essential hypertension   3. Mixed hyperlipidemia   4. Myasthenia gravis (HCC)    PLAN:    In order of problems listed above:  Adrian Bell is doing well with CAD having no angina pectoris following his previous PCI we will continue his medical therapy has been quite effective including aspirin beta-blocker oral nitrate and ranolazine.  At this time I would not advise an ischemia evaluation I am unsure how well his blood pressure is controlled he tells me he tends to run in the range of 1 40-1 50 he has a validated cuff at home I gave him instructions and good technique I asked him to check daily for 2 weeks leave a list of my office may require additional antihypertensive agent being very careful to choose agents not to worsen his muscle weakness with myasthenia gravis Continue his current statin check lipid profile today I can see where its been done at the [facility] Stable myasthenia doing very well with his current infusion be   Next appointment: 1 year   Medication Adjustments/Labs and Tests Ordered: Current medicines are reviewed at length with the patient today.  Concerns regarding medicines are outlined above.  No orders of the defined types were placed in this encounter.  No orders of the defined types were placed in this encounter.   Chief Complaint  Patient presents with   Follow-up   Coronary Artery Disease    History of Present Illness:    Adrian Bell is a 76 y.o. male with a hx of myasthenia gravis CAD with inferior ST elevation MI PCI and drug-eluting stent to the right coronary artery 07/21/2018 hypertension hyperlipidemia  last seen  10/16/2020.  Compliance with diet, lifestyle and medications: Yes  Adrian Bell is doing well with his myasthenia gravis and tolerates his current lipid-lowering treatment He is pleased with the quality of his life he is having no anginal discomfort shortness of breath edema chest pain palpitation or syncope  Recent labs [facility] 08/26/2021: Hemoglobin 13.2 platelets 198,000 Past Medical History:  Diagnosis Date   Dysphagia    Heart attack (HCC) 2020   Hyperlipidemia    Kidney stones    Myasthenia gravis (HCC)    STEMI (ST elevation myocardial infarction) [facility])     Past Surgical History:  Procedure Laterality Date   BACK SURGERY     CARDIAC CATHETERIZATION     COLONOSCOPY  2022   CORONARY/GRAFT ACUTE MI REVASCULARIZATION N/A 07/21/2018   Procedure: Coronary/Graft Acute MI Revascularization;  Surgeon: Corky Crafts, MD;  Location: [facility] INVASIVE CV LAB;  Service: Cardiovascular;  Laterality: N/A;   CYSTOSCOPY WITH INSERTION OF UROLIFT  2018   LASIK     LEFT HEART CATH AND CORONARY ANGIOGRAPHY N/A 07/21/2018   Procedure: LEFT HEART CATH AND CORONARY ANGIOGRAPHY;  Surgeon: Corky Crafts, MD;  Location: [facility] INVASIVE CV LAB;  Service: Cardiovascular;  Laterality: N/A;   LEFT HEART CATH AND CORONARY ANGIOGRAPHY N/A 12/08/2018   Procedure: LEFT HEART CATH AND CORONARY ANGIOGRAPHY;  Surgeon: [place]azil[place], Peter M, MD;  Location: [facility] INVASIVE CV LAB;  Service: Cardiovascular;  Laterality: N/A;   LITHOTRIPSY  NECK SURGERY     TONSILLECTOMY      Current Medications: Current Meds  Medication Sig   aspirin 81 MG chewable tablet Chew 1 tablet (81 mg total) by mouth daily.   Calcium Carbonate-Vit D-Min (CALCIUM 1200 PO) Take by mouth.   ferrous gluconate (FERGON) 324 MG tablet Take 1 tablet by mouth 3 (three) times a week. Monday, Wednesday AND Friday   GAMUNEX-C 20 GM/200ML SOLN    isosorbide mononitrate (IMDUR) 30 MG 24 hr tablet Take 0.5 tablets (15 mg total) by mouth daily.    metoprolol succinate (TOPROL-XL) 25 MG 24 hr tablet Take 0.5 tablets (12.5 mg total) by mouth daily. (Patient taking differently: Take 12.5 mg by mouth daily. 0.5 (12.5 mg ) daily)   nitroGLYCERIN (NITROSTAT) 0.4 MG SL tablet Place 1 tablet (0.4 mg total) under the tongue every 5 (five) minutes as needed.   Pitavastatin Calcium 2 MG TABS Take 1 tablet (2 mg total) by mouth daily.   predniSONE (DELTASONE) 20 MG tablet Alternate 25mg /30mg  x 2 weeks, then 25mg  daily. (Patient taking differently: Take 20 mg by mouth daily.)   ramipril (ALTACE) 5 MG capsule Take 5 mg by mouth daily.    ranolazine (RANEXA) 500 MG 12 hr tablet Take 1 tablet (500 mg total) by mouth 2 (two) times daily.     Allergies:   Zetia [ezetimibe], Demerol [meperidine hcl], and Statins   Social History   Socioeconomic History   Marital status: Married    Spouse name: Not on file   Number of children: 3   Years of education: 14   Highest education level: Not on file  Occupational History   Occupation: retired    Fish farm manager: [employer]  Tobacco Use   Smoking status: Never   Smokeless tobacco: Former    Types: Chew    Quit date: 2009  Vaping Use   Vaping Use: Never used  Substance and Sexual Activity   Alcohol use: Yes    Comment: occ    Drug use: No   Sexual activity: Not on file  Other Topics Concern   Not on file  Social History Narrative   Lives with wife in a one story home.  Has 3 children.     Retired Financial risk analyst.     Education: some college.    Right handed   Drinks caffeine    Social Determinants of Health   Financial Resource Strain: Not on file  Food Insecurity: Not on file  Transportation Needs: Not on file  Physical Activity: Not on file  Stress: Not on file  Social Connections: Not on file     Family History: The patient's family history includes Deep vein thrombosis in his brother; Diabetes in his sister; Esophageal cancer in his brother; Heart attack in his  father and mother; Heart disease in his mother; Stroke in his brother and sister. ROS:   Please see the history of present illness.    All other systems reviewed and are negative.  EKGs/Labs/Other Studies Reviewed:    The following studies were reviewed today:  EKG:  EKG ordered today and personally reviewed.  The ekg ordered today demonstrates sinus rhythm right bundle branch block insignificant inferior Q waves  Recent Labs: No results found for requested labs within last 365 days.  Recent Lipid Panel    Component Value Date/Time   CHOL 165 10/16/2019 1142   TRIG 115 10/16/2019 1142   HDL 59 10/16/2019 1142   CHOLHDL 2.8 10/16/2019 1142  CHOLHDL 3.1 07/21/2018 2254   VLDL 25 07/21/2018 2254   LDLCALC 86 10/16/2019 1142    Physical Exam:    VS:  BP (!) 150/82 (BP Location: Right Arm, Patient Position: Sitting)   Pulse (!) 57   Ht 5\' 6"  (1.676 m)   Wt 184 lb 6.4 oz (83.6 kg)   SpO2 99%   BMI 29.76 kg/m     Wt Readings from Last 3 Encounters:  11/10/21 184 lb 6.4 oz (83.6 kg)  09/23/21 184 lb (83.5 kg)  05/06/21 178 lb (80.7 kg)     GEN:  Well nourished, well developed in no acute distress HEENT: Normal NECK: No JVD; No carotid bruits LYMPHATICS: No lymphadenopathy CARDIAC: RRR, no murmurs, rubs, gallops RESPIRATORY:  Clear to auscultation without rales, wheezing or rhonchi  ABDOMEN: Soft, non-tender, non-distended MUSCULOSKELETAL:  No edema; No deformity  SKIN: Warm and dry NEUROLOGIC:  Alert and oriented x 3 PSYCHIATRIC:  Normal affect    Signed, 05/08/21, MD  11/10/2021 8:41 AM    [facility]

## 2021-11-10 ENCOUNTER — Ambulatory Visit: Payer: Medicare HMO | Attending: Cardiology | Admitting: Cardiology

## 2021-11-10 ENCOUNTER — Encounter: Payer: Self-pay | Admitting: Cardiology

## 2021-11-10 VITALS — BP 150/82 | HR 57 | Ht 66.0 in | Wt 184.4 lb

## 2021-11-10 DIAGNOSIS — I25110 Atherosclerotic heart disease of native coronary artery with unstable angina pectoris: Secondary | ICD-10-CM | POA: Diagnosis not present

## 2021-11-10 DIAGNOSIS — E782 Mixed hyperlipidemia: Secondary | ICD-10-CM | POA: Diagnosis not present

## 2021-11-10 DIAGNOSIS — I10 Essential (primary) hypertension: Secondary | ICD-10-CM

## 2021-11-10 DIAGNOSIS — G7000 Myasthenia gravis without (acute) exacerbation: Secondary | ICD-10-CM | POA: Diagnosis not present

## 2021-11-10 LAB — LIPID PANEL
Chol/HDL Ratio: 3.7 ratio (ref 0.0–5.0)
Cholesterol, Total: 216 mg/dL — ABNORMAL HIGH (ref 100–199)
HDL: 58 mg/dL (ref >39)
LDL Chol Calc (NIH): 123 mg/dL — ABNORMAL HIGH (ref 0–99)
Triglycerides: 202 mg/dL — ABNORMAL HIGH (ref 0–149)
VLDL Cholesterol Cal: 35 mg/dL (ref 5–40)

## 2021-11-10 MED ORDER — NITROGLYCERIN 0.4 MG SL SUBL: 0.4000 mg | SUBLINGUAL_TABLET | SUBLINGUAL | 1 refills | Status: AC | PRN

## 2021-11-10 NOTE — Patient Instructions (Addendum)
Medication Instructions:  Your physician has recommended you make the following change in your medication:   START: Nitroglycerin 0.4 mg under the tongue every 5 minutes as needed for chest pain  *If you need a refill on your cardiac medications before your next appointment, please call your pharmacy*   Lab Work: Your physician recommends that you return for lab work in:   Labs today: Lipids  If you have labs (blood work) drawn today and your tests are completely normal, you will receive your results only by: [place] (if you have MyChart) OR A paper copy in the mail If you have any lab test that is abnormal or we need to change your treatment, we will call you to review the results.   Testing/Procedures: None   Follow-Up: At [facility], you and your health needs are our priority.  As part of our continuing mission to provide you with exceptional heart care, we have created designated Provider Care Teams.  These Care Teams include your primary Cardiologist (physician) and Advanced Practice Providers (APPs -  Physician Assistants and Nurse Practitioners) who all work together to provide you with the care you need, when you need it.  We recommend signing up for the patient portal called "MyChart".  Sign up information is provided on this After Visit Summary.  MyChart is used to connect with patients for Virtual Visits (Telemedicine).  Patients are able to view lab/test results, encounter notes, upcoming appointments, etc.  Non-urgent messages can be sent to your provider as well.   To learn more about what you can do with MyChart, go to [email or web address].    Your next appointment:   1 year(s)  The format for your next appointment:   In Person  Provider:   Shirlee More, MD    Other Instructions Check home blood pressures and send a list in 2 weeks to the office.  Important Information About Sugar         [employer]  Tips to measure your blood  pressure correctly  To determine whether you have hypertension, a medical professional will take a blood pressure reading. How you prepare for the test, the position of your arm, and other factors can change a blood pressure reading by 10% or more. That could be enough to hide high blood pressure, start you on a drug you don't really need, or lead your doctor to incorrectly adjust your medications. National and international guidelines offer specific instructions for measuring blood pressure. If a doctor, nurse, or medical assistant isn't doing it right, don't hesitate to ask him or her to get with the guidelines. Here's what you can do to ensure a correct reading:  Don't drink a caffeinated beverage or smoke during the 30 minutes before the test.  Sit quietly for five minutes before the test begins.  During the measurement, sit in a chair with your feet on the floor and your arm supported so your elbow is at about heart level.  The inflatable part of the cuff should completely cover at least 80% of your upper arm, and the cuff should be placed on bare skin, not over a shirt.  Don't talk during the measurement.  Have your blood pressure measured twice, with a brief break in between. If the readings are different by 5 points or more, have it done a third time. There are times to break these rules. If you sometimes feel lightheaded when getting out of bed in the morning or when you stand after  sitting, you should have your blood pressure checked while seated and then while standing to see if it falls from one position to the next. Because blood pressure varies throughout the day, your doctor will rarely diagnose hypertension on the basis of a single reading. Instead, he or she will want to confirm the measurements on at least two occasions, usually within a few weeks of one another. The exception to this rule is if you have a blood pressure reading of 180/110 mm Hg or higher. A result this high usually  calls for prompt treatment. It's also a good idea to have your blood pressure measured in both arms at least once, since the reading in one arm (usually the right) may be higher than that in the left. A 2014 study in The American Journal of Medicine of nearly 3,400 people found average arm- to-arm differences in systolic blood pressure of about 5 points. The higher number should be used to make treatment decisions. In 2017, new guidelines from the American Heart Association, the [employer] of Cardiology, and nine other health organizations lowered the diagnosis of high blood pressure to 130/80 mm Hg or higher for all adults. The guidelines also redefined the various blood pressure categories to now include normal, elevated, Stage 1 hypertension, Stage 2 hypertension, and hypertensive crisis (see "Blood pressure categories"). Blood pressure categories  Blood pressure category SYSTOLIC (upper number)  DIASTOLIC (lower number)  Normal Less than 120 mm Hg and Less than 80 mm Hg  Elevated 120-129 mm Hg and Less than 80 mm Hg  High blood pressure: Stage 1 hypertension 130-139 mm Hg or 80-89 mm Hg  High blood pressure: Stage 2 hypertension 140 mm Hg or higher or 90 mm Hg or higher  Hypertensive crisis (consult your doctor immediately) Higher than 180 mm Hg and/or Higher than 120 mm Hg  Source: American Heart Association and American Stroke Association. For more on getting your blood pressure under control, buy Controlling Your Blood Pressure, a Special Health Report from [facility].

## 2021-11-17 DIAGNOSIS — G7001 Myasthenia gravis with (acute) exacerbation: Secondary | ICD-10-CM | POA: Diagnosis not present

## 2021-11-24 ENCOUNTER — Telehealth: Payer: Self-pay

## 2021-11-24 NOTE — Progress Notes (Unsigned)
Follow-up Visit   Date: 11/25/21    Adrian Bell MRN: [id number] DOB: 06/23/1945   Interim History: Adrian Bell is a 76 y.o. right-handed Caucasian male with hypertension, kidney stones, and iron deficiency anemia returning to the clinic for follow-up of seropositive myasthenia gravis and new complaints of numbness/tingling in the legs.   The patient was accompanied to the clinic by self.  IMPRESSION/PLAN: Seropositive bulbar myasthenia gravis without exacerbation, diagnosed 2019, thymoma negative.  Clinically, doing well on a combination of IVIG every 3 weeks and prednisone $RemoveBefore'20mg'LJDtuliIPgBlS$  daily. MG history:  - Highest dose of prednisone is $RemoveBefor'30mg'XQPYVlDwQlDh$ /d in 2019   - Tapering prednisone < $RemoveBefo'5mg'OLbOYlErAlv$  caused breakthrough weakness, so he has been on $Remov'5mg'TqnjIw$ /d since early 2022   - January 2023 - new onset of bulbar weakness, prednisone titrated to $RemoveBef'30mg'sCGlUpOwTt$  daily  - April 2023 - IVIG started due to bulbar symptoms   - June 2023 - IVIG adjusted to every 3 weeks, clinically improved  - August 2023 - started prednisone taper    Plan to taper prednisone down to $Remov'20mg'kHhOfV$  alternating with $RemoveBeforeDE'15mg'XFUxqkkcdnrOqGB$  daily x 2 weeks, then $RemoveBef'15mg'uUylpTPjIo$  daily, then $RemoveBef'15mg'hspjpjXSzF$  alternate with $RemoveBefore'10mg'yqSPVlNZSbktb$  x 2 weeks, then $RemoveBef'10mg'OrIcVrrzxk$  daily  Continue IVIG every 21 days, plan to taper frequency if stable on prednisone $RemoveBefor'10mg'fVYXgUlHlaVY$   2. Peripheral neuropathy manifesting with distal sensory loss and ataxia based on history and exam  - NCS/EMG of the right arm and leg  - Check ESR, CRP, vitamin B12, folate, copper, SPEP with IFE, HbA1c, TSH  - Check feet daily  - Fall precautions discussed   Return to clinic in 3 months  ------------------------------------------------------------------------------ History of present illness: Starting around the summer of 2018, he developed spells of slurred speech, difficulty swallowing, and double vision which is always worse by the afternoon and evening. Closing one eye would resolve his double vision. Slurred speech is worse after talking for longer periods  of time. Rest always improves his symptoms.  He denies any arm or leg weakness.  He went to the emergency department in October and was evaluated for TIA with CT head, MRI brain, and echo which returned normal, per patient.  He was diagnosed with myasthenia after AChR returned positive. He was started on mestinon and within an hour of his first dose, he noticed resolution of double vision.  He was able to increase mestinon to $RemoveBef'60mg'RBynoLYjhP$  three times daily (8am, 1pm, and 5pm).  Prednisone was increased to $RemoveBefo'30mg'XKGepOQIssZ$  daily which controlled symptoms and he has been slowly tapered over the fall of 2019.    He had one episode of what sounds like transient global amnesia in 2016 which lasted about an hour.   UPDATE 06/02/2020:  He is here for follow-up visit.  He remains on prednisone $RemoveBefor'5mg'pTAXicycUIwR$  and mestinon $RemoveBefo'60mg'MZQqgJvDZLa$  twice daily.  Sometimes, he continues to get blurred vision/halo in the evenings, which he thinks is due to his cataracts.  He denies droopiness of the eyelids or double vision.  No difficulty swallowing/talking or limb weakness.  He has noticed increased cramps in the legs, if he takes an extra mestinon.   UPDATE 12/05/2020:  He is here for follow-up visit.  He is doing well on prednisone $RemoveBefor'5mg'wmOsOLBrWiZp$  and denies droopiness of the eyes or double vision.  His cramps are much better on mestinon $RemoveBef'30mg'kwfSegjZbd$  daily.  He no longer has shortness of breath.  No weakness of the arms or legs. His wife is scheduled to have hysterectomy for severe precancerous mass next month.  UPDATE 04/03/2021:  He is here for acute visit. Starting around January, he began having difficulty swallowing, talking, difficulty with chewing/spitting, droopy eyelids. No double vision. He has some weakness in the arms and legs. He has been compliant with taking prednisone $RemoveBeforeDEI'5mg'BXIdEBDKQyPaVJKO$  daily.  Because of his worsening symptoms, three days ago, he increased prednisone to $RemoveBefor'15mg'vrlxCaNuqmHM$ ,the'10mg'$ ,and took $RemoveB'5mg'YJVsVpHJ$  daily.  He tried taking mestinon, but developed cramps so stopped it. He feels that his  swallow and speech is slightly better today. No shortness of breath.   UPDATE 05/06/2021:  He is here for acute follow-up because he continues to have difficulty with speech and swallow despite increasing the dose of his prednisone. Patient was instructed to increase to $RemoveBef'20mg'hUGWFGTHRz$  at his last visit and over the past 2 weeks, he has increased it further to  prednisone $RemoveBef'30mg'QeHvYVmvrd$ /d.  The severity of his speech/swallow has improved from a month ago, but he still needs to be cautious eating textured foods and speech is much worse in the evening and with prolonged speech.  He has some weakness in the legs.  No shortness of breath.   UPDATE 09/23/2021:  He is here for follow-up visit.  Over the past few months, he began having worsening bulbar weakness around week 3 of IVIG infusion, so his dosing was adjusted to every 3 weeks.  Since this, he has noticed marked improvement and denies any difficulty with swallowing/speech/droopiness/double vision.  He remains on prednisone $RemoveBefor'30mg'vwTeucfqWBxp$  daily and IVIG every 3 weeks.  No new complaints.   UPDATE 11/25/2021:  He is here for follow-up.  Myasthenia gravis is under excellent control, even with reducing the dose of his prednisone down to $Remov'20mg'NdSuLk$  daily.  No vision changes, ptosis, speech/swallow changes, or weakness.   He also complains of numbness/tingling for the past 20 years.  It has been constant in the toes and soles of the feet, and worsening over the past 1-2 years. Sometimes he feels as if he is walking on air. Occasionally, he experiences it in his lower legs and hands.  Balance is good. No weakness.   No history of diabetes, heavy alcohol use, or chemotherapy.     Medications:  Current Outpatient Medications on File Prior to Visit  Medication Sig Dispense Refill   aspirin 81 MG chewable tablet Chew 1 tablet (81 mg total) by mouth daily. 90 tablet 1   Calcium Carbonate-Vit D-Min (CALCIUM 1200 PO) Take by mouth.     ferrous gluconate (FERGON) 324 MG tablet Take 1 tablet by mouth 3  (three) times a week. Monday, Wednesday AND Friday     GAMUNEX-C 20 GM/200ML SOLN      isosorbide mononitrate (IMDUR) 30 MG 24 hr tablet Take 0.5 tablets (15 mg total) by mouth daily. 90 tablet 3   metoprolol succinate (TOPROL-XL) 25 MG 24 hr tablet Take 0.5 tablets (12.5 mg total) by mouth daily. (Patient taking differently: Take 12.5 mg by mouth daily. 0.5 (12.5 mg ) daily) 90 tablet 1   nitroGLYCERIN (NITROSTAT) 0.4 MG SL tablet Place 1 tablet (0.4 mg total) under the tongue every 5 (five) minutes as needed for chest pain. 25 tablet 1   Pitavastatin Calcium 2 MG TABS Take 1 tablet (2 mg total) by mouth daily. 90 tablet 3   predniSONE (DELTASONE) 20 MG tablet Alternate $RemoveBeforeDE'25mg'aKyxvPVAYlouVGt$ /$RemoveB'30mg'pIgDanoZ$  x 2 weeks, then $RemoveBef'25mg'GbrNgfLNhs$  daily. (Patient taking differently: Take 20 mg by mouth daily.) 135 tablet 0   ramipril (ALTACE) 5 MG capsule Take 5 mg by mouth daily.      ranolazine (  RANEXA) 500 MG 12 hr tablet Take 1 tablet (500 mg total) by mouth 2 (two) times daily. 60 tablet 2   predniSONE (DELTASONE) 5 MG tablet Take 1 tablet (5 mg total) by mouth daily with breakfast. (Patient not taking: Reported on 11/10/2021) 90 tablet 3   No current facility-administered medications on file prior to visit.    Allergies:  Allergies  Allergen Reactions   Zetia [Ezetimibe] Other (See Comments)    Weakness, shortness of breath   Demerol [Meperidine Hcl]    Statins Other (See Comments)    myalgias    Vital Signs:  BP (!) 155/74   Pulse 68   Ht $R'5\' 6"'CI$  (1.676 m)   Wt 187 lb (84.8 kg)   SpO2 98%   BMI 30.18 kg/m   Neurological Exam: MENTAL STATUS including orientation to time, place, person, recent and remote memory, attention span and concentration, language, and fund of knowledge is normal.  There is no dysarthria.  CRANIAL NERVES:  Pupils equal round and reactive to light.  Normal conjugate, extra-ocular eye movements in all directions of gaze. Trace left ptosis.  Face is symmetric.  Facial muscles are 5/5 with orbicularis  oculi and buccinator testing.   Palate elevates symmetrically.  Tongue is midline with 5/5 strength (improved)   MOTOR:  Motor strength is 5/5 in all extremities, including neck flexion. No fatigability.  COORDINATION/GAIT:   Gait narrow based and stable. He is able to stand up without using arms to push off.   Data: CT head 12/08/2016:  Unremarkable  Labs 04/20/2017:   AChR binding 9.20*, blocking 76*, and modulating 90*  CT chest wo contrast 05/04/2017:   1. Unremarkable CT examination the chest. No anterior mediastinal mass/thymoma is identified. 2. No acute pulmonary findings or worrisome pulmonary lesions. 3. Significant three-vessel coronary artery calcifications. 4. Cholelithiasis.  Labs received 06/29/2017:  TPMT  13 (normal).     Thank you for allowing me to participate in patient's care.  If I can answer any additional questions, I would be pleased to do so.    Sincerely,    Chanz Cahall K. Posey Pronto, DO

## 2021-11-24 NOTE — Telephone Encounter (Signed)
Patient dropped off home blood pressure reading for Dr Bettina Gavia to review. List placed in folder to be reviewed

## 2021-11-25 ENCOUNTER — Encounter: Payer: Self-pay | Admitting: Neurology

## 2021-11-25 ENCOUNTER — Ambulatory Visit: Payer: Medicare HMO | Admitting: Neurology

## 2021-11-25 ENCOUNTER — Other Ambulatory Visit (INDEPENDENT_AMBULATORY_CARE_PROVIDER_SITE_OTHER): Payer: Medicare HMO

## 2021-11-25 VITALS — BP 155/74 | HR 68 | Ht 66.0 in | Wt 187.0 lb

## 2021-11-25 DIAGNOSIS — G7000 Myasthenia gravis without (acute) exacerbation: Secondary | ICD-10-CM

## 2021-11-25 DIAGNOSIS — G629 Polyneuropathy, unspecified: Secondary | ICD-10-CM | POA: Diagnosis not present

## 2021-11-25 DIAGNOSIS — Z131 Encounter for screening for diabetes mellitus: Secondary | ICD-10-CM | POA: Diagnosis not present

## 2021-11-25 LAB — C-REACTIVE PROTEIN: CRP: <1 mg/dL (ref 0.5–20.0)

## 2021-11-25 LAB — B12 AND FOLATE PANEL
Folate: 12.8 ng/mL (ref >5.9)
Vitamin B-12: 113 pg/mL — ABNORMAL LOW (ref 211–911)

## 2021-11-25 LAB — TSH: TSH: 2.32 u[IU]/mL (ref 0.35–5.50)

## 2021-11-25 LAB — SEDIMENTATION RATE: Sed Rate: 44 mm/hr — ABNORMAL HIGH (ref 0–20)

## 2021-11-25 LAB — HEMOGLOBIN A1C: Hgb A1c MFr Bld: 6.3 % (ref 4.6–6.5)

## 2021-11-25 NOTE — Patient Instructions (Addendum)
Reduce prednisone 20mg  alternate with 15mg  x 2 weeks, then reduce to 15mg  daily x 2 weeks, 15mg  alternate with 10mg  x 2 week, then stay on 10mg  daily  Nerve testing of the right arm and leg  Check labs  Return to clinic in 3 months  [place] (EMG/NCS) INSTRUCTIONS  How to Prepare The neurologist conducting the EMG will need to know if you have certain medical conditions. Tell the neurologist and other EMG lab personnel if you: Have a pacemaker or any other electrical medical device Take blood-thinning medications Have hemophilia, a blood-clotting disorder that causes prolonged bleeding Bathing Take a shower or bath shortly before your exam in order to remove oils from your skin. Don't apply lotions or creams before the exam.  What to Expect You'll likely be asked to change into a hospital gown for the procedure and lie down on an examination table. The following explanations can help you understand what will happen during the exam.  Electrodes. The neurologist or a technician places surface electrodes at various locations on your skin depending on where you're experiencing symptoms. Or the neurologist may insert needle electrodes at different sites depending on your symptoms.  Sensations. The electrodes will at times transmit a tiny electrical current that you may feel as a twinge or spasm. The needle electrode may cause discomfort or pain that usually ends shortly after the needle is removed. If you are concerned about discomfort or pain, you may want to talk to the neurologist about taking a short break during the exam.  Instructions. During the needle EMG, the neurologist will assess whether there is any spontaneous electrical activity when the muscle is at rest - activity that isn't present in healthy muscle tissue - and the degree of activity when you slightly contract the muscle.  He or she will give you instructions on resting and contracting a muscle  at appropriate times. Depending on what muscles and nerves the neurologist is examining, he or she may ask you to change positions during the exam.  After your EMG You may experience some temporary, minor bruising where the needle electrode was inserted into your muscle. This bruising should fade within several days. If it persists, contact your primary care doctor.

## 2021-11-30 LAB — PROTEIN ELECTROPHORESIS, SERUM
Albumin ELP: 3.7 g/dL — ABNORMAL LOW (ref 3.8–4.8)
Alpha 1: 0.3 g/dL (ref 0.2–0.3)
Alpha 2: 0.8 g/dL (ref 0.5–0.9)
Beta 2: 0.3 g/dL (ref 0.2–0.5)
Beta Globulin: 0.4 g/dL (ref 0.4–0.6)
Gamma Globulin: 2.1 g/dL — ABNORMAL HIGH (ref 0.8–1.7)
Total Protein: 7.5 g/dL (ref 6.1–8.1)

## 2021-11-30 LAB — IMMUNOFIXATION ELECTROPHORESIS
IgG (Immunoglobin G), Serum: 2319 mg/dL — ABNORMAL HIGH (ref 600–1540)
IgM, Serum: 140 mg/dL (ref 50–300)
Immunoglobulin A: 99 mg/dL (ref 70–320)

## 2021-11-30 LAB — COPPER, SERUM: Copper: 80 ug/dL (ref 70–175)

## 2021-12-03 ENCOUNTER — Other Ambulatory Visit: Payer: Self-pay

## 2021-12-03 ENCOUNTER — Telehealth: Payer: Self-pay | Admitting: Cardiology

## 2021-12-03 ENCOUNTER — Ambulatory Visit: Payer: Medicare HMO | Admitting: Neurology

## 2021-12-03 DIAGNOSIS — G7000 Myasthenia gravis without (acute) exacerbation: Secondary | ICD-10-CM

## 2021-12-03 DIAGNOSIS — G629 Polyneuropathy, unspecified: Secondary | ICD-10-CM

## 2021-12-03 DIAGNOSIS — Z131 Encounter for screening for diabetes mellitus: Secondary | ICD-10-CM | POA: Diagnosis not present

## 2021-12-03 NOTE — Telephone Encounter (Signed)
Patient states he previously received Ramipril from the [facility] [place] but can no longer get it filled from them. His hope is that Dr. Bettina Gavia can prescribe it through [facility]. Please let patient know at [phone]  Thank you!

## 2021-12-03 NOTE — Patient Instructions (Signed)
-    Start vitamin B12 1000mcg daily

## 2021-12-03 NOTE — Procedures (Addendum)
[facility]  [name]  [name], [name] [address] Tel: [phone] Fax: [phone] Test Date:  12/03/2021  Patient: Adrian Bell DOB: 1945-11-05 Physician: Narda Amber, DO  Sex: Male Height: 5\' 6"  Ref Phys: Narda Amber, DO  ID#: [id number]   Technician:    History: This is a 76 year old man referred for evaluation of paresthesias involving the hands and lower extremities.  NCV & EMG Findings: Extensive electrodiagnostic testing of the right upper and lower extremity shows:  Right median, ulnar sensory responses show reduced amplitude (R6, R4, R4 V).  Right sural and superficial peroneal sensory responses are absent. Right median and ulnar motor responses are within normal limits.  Right fibular motor response at the extensor digitorum brevis is absent, and normal at the tibialis anterior.  Right tibial motor response is absent. Right tibial H-reflex is prolonged. Chronic motor axon loss changes are seen affecting the distal hand muscles and muscles below the knee.  No evidence of accompanied active denervation.    Impression: The electrophysiologic findings are consistent with a chronic, length-dependent sensorimotor axonal polyneuropathy affecting the right upper and lower extremities, moderate in degree electrically.   ___________________________ Narda Amber, DO    Nerve Conduction Studies Motor Nerve Results    Latency Amplitude F-Lat Segment Distance CV Comment  Site (ms) Norm (mV) Norm (ms)  (cm) (m/s) Norm   Right Fibular (EDB) Motor  Ankle *NR  < 6.0 *NR  > 2.5        Bel fib head *NR - *NR -  Bel fib head-Ankle - *NR  > 40   Pop fossa *NR - *NR -  Pop fossa-Bel fib head - *NR -   Right Fibular (TA) Motor  Fib head 3.6  < 4.5 3.6  > 3.0        Pop fossa 5.4  < 6.7 3.5 -  Pop fossa-Fib head 9 50  > 40   Right Median (APB) Motor  Wrist 4.0  < 4.0 5.1  > 5.0        Elbow 10.0 - 4.6 -  Elbow-Wrist 30 50  > 50   Right Tibial (AH)  Motor  Ankle *NR  < 6.0 *NR  > 4.0        Knee *NR - *NR -  Knee-Ankle - *NR  > 40   Right Ulnar (ADM) Motor  Wrist 3.1  < 3.1 9.1  > 7.0        Bel elbow 7.5 - 9.0 -  Bel elbow-Wrist 22 50  > 50   Ab elbow 9.4 - 8.7 -  Ab elbow-Bel elbow 10 53 -    Sensory Sites    Neg Peak Lat Amplitude (O-P) Segment Distance Velocity Comment  Site (ms) Norm (V) Norm  (cm) (ms)   Right Median Sensory  Wrist-Dig II *4.1  < 3.8 *6  > 10 Wrist-Dig II 13    Right Radial Sensory  Forearm-Wrist 1.15  < 2.8 *4  > 10 Forearm-Wrist 10    Right Superficial Fibular Sensory  14 cm-Ankle *NR  < 4.6 *NR  > 3 14 cm-Ankle 14    Right Sural Sensory  Calf-Lat mall *NR  < 4.6 *NR  > 3 Calf-Lat mall 14    Right Ulnar Sensory  Wrist-Dig V 1.83  < 3.2 *4  > 5 Wrist-Dig V 11     H-Reflex Results    M-Lat H Lat H Neg Amp H-M Lat  Site (ms) (ms)  Norm (mV) (ms)  Right Tibial H-Reflex  Pop fossa 4.8 *43.1  < 35.0 0.17 38.3   Electromyography   Side Muscle Ins.Act Fibs Fasc Recrt Amp Dur Poly Activation Comment  Right FDI Nml Nml Nml *1- *1+ *1+ *1+ Nml N/A  Right EIP Nml Nml Nml Nml Nml Nml Nml Nml N/A  Right APB Nml Nml Nml *1- *1+ *1+ *1+ Nml N/A  Right Pronator teres Nml Nml Nml Nml Nml Nml Nml Nml N/A  Right Biceps Nml Nml Nml Nml Nml Nml Nml Nml N/A  Right Triceps Nml Nml Nml Nml Nml Nml Nml Nml N/A  Right Deltoid Nml Nml Nml Nml Nml Nml Nml Nml N/A  Right FDL Nml Nml Nml *1- *1+ *1+ *1+ Nml N/A  Right Gastroc MH Nml Nml Nml Nml Nml Nml Nml Nml N/A  Right Gluteus med Nml Nml Nml Nml Nml Nml Nml Nml N/A  Right Tib ant Nml Nml Nml *1- *1+ *1+ *1+ Nml N/A  Right Vastus lat Nml Nml Nml Nml Nml Nml Nml Nml N/A      Waveforms:  Motor             Sensory             H-Reflex

## 2021-12-03 NOTE — Progress Notes (Signed)
Follow-up Visit   Date: 12/03/21    Adrian Bell MRN: [id number] DOB: [birth year]   Interim History: Adrian Bell is a 76 y.o. right-handed Caucasian male with hypertension, kidney stones, and iron deficiency anemia returning to the clinic for follow-up of seropositive myasthenia gravis and numbness/tingling in the legs.   The patient was accompanied to the clinic by self.  IMPRESSION/PLAN: Peripheral neuropathy, confirmed by EMG, affecting the arm and the leg in a length-dependent manner.  Patient reports having symptoms > 20 years and it has steadily progressed over time.  He has no history of diabetes, alcohol, family history of neuropathy, or chemotherapy exposure.  He does report being exposed to [employer] which can cause neuropathy.  Management remains supportive.  Vitamin B12 deficiency. Start vitamin B12 1036mcg daily   Return to clinic in 3 months  ------------------------------------------------------------------------------ UPDATE 12/03/2021:  He is here for EMG of the right side.  He reports ongoing numbness/tingling of the legs from the knees down as well as the hands.  No weakness or imbalance. 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    Medications:  Current Outpatient Medications on File Prior to Visit  Medication Sig Dispense Refill   aspirin 81 MG chewable tablet Chew 1 tablet (81 mg total) by mouth daily. 90 tablet 1   Calcium Carbonate-Vit D-Min (CALCIUM 1200 PO) Take by mouth.     ferrous gluconate (FERGON) 324 MG tablet Take 1 tablet by mouth 3 (three) times a week. Monday, Wednesday AND Friday     GAMUNEX-C 20 GM/200ML SOLN      isosorbide mononitrate (IMDUR) 30 MG 24 hr tablet Take 0.5 tablets (15 mg total) by mouth daily. 90 tablet 3   metoprolol succinate (TOPROL-XL) 25 MG 24 hr tablet Take 0.5 tablets (12.5 mg total) by mouth daily. (Patient taking differently: Take 12.5 mg by mouth daily. 0.5 (12.5 mg ) daily) 90 tablet 1   nitroGLYCERIN  (NITROSTAT) 0.4 MG SL tablet Place 1 tablet (0.4 mg total) under the tongue every 5 (five) minutes as needed for chest pain. 25 tablet 1   Pitavastatin Calcium 2 MG TABS Take 1 tablet (2 mg total) by mouth daily. 90 tablet 3   predniSONE (DELTASONE) 20 MG tablet Alternate 25mg /30mg  x 2 weeks, then 25mg  daily. (Patient taking differently: Take 20 mg by mouth daily.) 135 tablet 0   predniSONE (DELTASONE) 5 MG tablet Take 1 tablet (5 mg total) by mouth daily with breakfast. (Patient not taking: Reported on 11/10/2021) 90 tablet 3   ramipril (ALTACE) 5 MG capsule Take 5 mg by mouth daily.      ranolazine (RANEXA) 500 MG 12 hr tablet Take 1 tablet (500 mg total) by mouth 2 (two) times daily. 60 tablet 2   No current facility-administered medications on file prior to visit.    Allergies:  Allergies  Allergen Reactions   Zetia [Ezetimibe] Other (See Comments)    Weakness, shortness of breath   Demerol [Meperidine Hcl]    Statins Other (See Comments)    myalgias    Vital Signs:  There were no vitals taken for this visit.  Neurological Exam: Deferred  Data: CT head 12/08/2016:  Unremarkable  Labs 04/20/2017:   AChR binding 9.20*, blocking 76*, and modulating 90*  CT chest wo contrast 05/04/2017:   1. Unremarkable CT examination the chest. No anterior mediastinal mass/thymoma is identified. 2. No acute pulmonary findings or worrisome pulmonary lesions. 3. Significant three-vessel coronary artery calcifications. 4.  Cholelithiasis.  Labs received 06/29/2017:  TPMT  13 (normal).  NCS/EMG of the right arm and leg 12/03/2021: The electrophysiologic findings are consistent with a chronic, length-dependent sensorimotor axonal polyneuropathy affecting the right upper and lower extremities, moderate in degree electrically.   Thank you for allowing me to participate in patient's care.  If I can answer any additional questions, I would be pleased to do so.    Sincerely,    Rennie Hack K. Allena Katz,  DO

## 2021-12-04 MED ORDER — RAMIPRIL 5 MG PO CAPS: 5.0000 mg | ORAL_CAPSULE | Freq: Every day | ORAL | 3 refills | Status: DC

## 2021-12-04 NOTE — Telephone Encounter (Signed)
Called patient back to let him know that ramipril was being sent to his pharmacy of choice. Patient verbalized understanding.

## 2021-12-07 DIAGNOSIS — G7001 Myasthenia gravis with (acute) exacerbation: Secondary | ICD-10-CM | POA: Diagnosis not present

## 2021-12-11 ENCOUNTER — Telehealth: Payer: Self-pay | Admitting: Neurology

## 2021-12-11 NOTE — Telephone Encounter (Signed)
Patient stopped by the office to request EMG results to be mailed to his home address.   Release signed.  Patient stated he was told by the medical records department it could take up to 30 days to process the request.  Patient wants to if the office can help him instead?

## 2021-12-14 NOTE — Telephone Encounter (Signed)
EMG results mailed to patient.

## 2021-12-18 ENCOUNTER — Other Ambulatory Visit: Payer: Self-pay | Admitting: Neurology

## 2021-12-18 ENCOUNTER — Telehealth: Payer: Self-pay

## 2021-12-18 MED ORDER — PREDNISONE 10 MG PO TABS: 10.0000 mg | ORAL_TABLET | Freq: Every day | ORAL | 3 refills | Status: DC

## 2021-12-18 NOTE — Telephone Encounter (Signed)
Rx sent for prednisone 10 mg tablets

## 2021-12-28 DIAGNOSIS — G7001 Myasthenia gravis with (acute) exacerbation: Secondary | ICD-10-CM | POA: Diagnosis not present

## 2021-12-29 ENCOUNTER — Telehealth: Payer: Self-pay | Admitting: Neurology

## 2021-12-29 NOTE — Telephone Encounter (Signed)
Pt has been having side effects from his infusion. His last infusion on 12/07/21 cause his left heel to hurt pretty bad for a few days. Yesterdays infusion about 3-4 hours later, he got a fever of 100.6, chills, a sore stiff neck, and slight headache. All of those symptoms lasted about 3-4 hours.

## 2021-12-29 NOTE — Telephone Encounter (Signed)
Headache and low grade fever may be side effect of the IVIG.  I would recommend that we hold IVIG for now and stay on prednisone as planned.  Let's see how he does on prednisone alone and keep IVIG as a backup, if needed in the future.

## 2021-12-29 NOTE — Telephone Encounter (Signed)
Called patient and left a message for a call back.  

## 2021-12-30 NOTE — Telephone Encounter (Signed)
Called patient and left a message for a call back.  

## 2021-12-30 NOTE — Telephone Encounter (Signed)
Called patient and informed him of Dr. Eliane Decree advice and recommendations to hold on IVIG and continue on prednisone. Patient wanted to ensure that Dr. Allena Katz wanted him to stay on the dose he is on:  Patient currently on Prednisone 10 mg & 15 mg alternating days for 7 more days. Then will be on Prednisone 10 mg daily thereafter.  Informed patient that I will confirm with Dr. Allena Katz and let him know tomorrow since Dr. Allena Katz is out for  the day. Patient stated that if he does not answer to please leave a detailed message for him.   Patient has verbalized understanding of what was explained to him and had no further questions or concerns.

## 2021-12-31 NOTE — Telephone Encounter (Signed)
Called patient and left a detailed message to informed him to: continue prednisone 10mg  and  15mg  x 1 week, then 10mg  daily.    My Contact information was provided for patient to call with any questions or concerns.

## 2021-12-31 NOTE — Telephone Encounter (Signed)
Correct, continue prednisone 10mg  and 15mg  x 1 week, then 10mg  daily.

## 2022-01-08 ENCOUNTER — Telehealth: Payer: Self-pay | Admitting: Neurology

## 2022-01-08 NOTE — Telephone Encounter (Signed)
Called [facility]. No extension given. Unable to locate [place].

## 2022-01-08 NOTE — Telephone Encounter (Signed)
Per previous encounter: Pt has been having side effects from his infusion. His last infusion on 12/07/21 cause his left heel to hurt pretty bad for a few days. Yesterdays infusion about 3-4 hours later, he got a fever of 100.6, chills, a sore stiff neck, and slight headache. All of those symptoms lasted about 3-4 hours.

## 2022-01-08 NOTE — Telephone Encounter (Signed)
Bonita Quin with [facility] called in wanting to follow up on the side effects of the pt's last IVIG infusion and would like a call back from the clinical staff.

## 2022-01-18 ENCOUNTER — Telehealth: Payer: Self-pay | Admitting: Anesthesiology

## 2022-01-18 NOTE — Telephone Encounter (Signed)
Per Verita Schneiders at [facility]: "I have updated the patient chart and contact the person responsible for scheduling shipment.  This patient is on hold."

## 2022-01-18 NOTE — Telephone Encounter (Signed)
Received notification that Porfirio Mylar is out of office until 01/26/22. Sent email to two other covering reps. Pending response.

## 2022-01-18 NOTE — Telephone Encounter (Signed)
Called patient and informed him that I have taken care of his infusion and it has been placed on hold. Patient is aware to call us if he needs [name], but in the mean time Dr. Allena Katz will f/u with him in January and discuss his infusions at the visit. Patient verbalized understanding and had no further questions or concerns.

## 2022-01-18 NOTE — Telephone Encounter (Signed)
Patient called stating the [facility]fusion pharmacy called him to inform Dr Allena Katz that she needs to send them a hold order on his infusion medication otherwise they will send the medication to his home. Requests call back.

## 2022-01-18 NOTE — Telephone Encounter (Signed)
Adrian Bell was supposed to hold infusions. I will email her to see what is going on and once I hear back I will call patient.

## 2022-03-17 ENCOUNTER — Encounter: Payer: Self-pay | Admitting: Neurology

## 2022-03-17 ENCOUNTER — Ambulatory Visit: Payer: Medicare HMO | Admitting: Neurology

## 2022-03-17 ENCOUNTER — Other Ambulatory Visit: Payer: Self-pay

## 2022-03-17 ENCOUNTER — Telehealth: Payer: Self-pay | Admitting: Cardiology

## 2022-03-17 VITALS — BP 130/70 | HR 79 | Ht 66.0 in | Wt 184.0 lb

## 2022-03-17 DIAGNOSIS — G7000 Myasthenia gravis without (acute) exacerbation: Secondary | ICD-10-CM | POA: Diagnosis not present

## 2022-03-17 MED ORDER — ISOSORBIDE MONONITRATE ER 30 MG PO TB24: 30.0000 mg | ORAL_TABLET | Freq: Every day | ORAL | 3 refills | Status: DC

## 2022-03-17 NOTE — Telephone Encounter (Signed)
Called patient and he reported that he has been having SOB for the past two and a half months and has noticed it gradually getting worse which is the reason he called today. He also states he has had three episodes of chest tightness in that time frame, two causing him to have to take Nitro. Patient also c/o his left arm aching which he reports has been consistent for the past 2-3 days, but started coming and going about a month ago. Currently he states that he feels fine and has no symptoms. Spoke to Dr. Agustin Cree regarding the patient's symptoms that he reported and he increased his Imdur from 15 mg to 30 mg daily and asked that Dr. Bettina Gavia see the patient on Monday when he returns. I informed the patient of Dr. Wendy Poet orders and he was agreeable with this plan. An appointment for him to see Dr. Bettina Gavia was scheduled for 9:00 am on 03/22/22 and a new prescription was ordered via Epic and sent to his pharmacy. Patient had no further questions at this time.

## 2022-03-17 NOTE — Progress Notes (Signed)
Follow-up Visit   Date: 03/17/22    Adrian Bell MRN: [id number] DOB: 1945-08-24   Interim History: Adrian Bell is a 77 y.o. right-handed Caucasian male with hypertension, kidney stones, and iron deficiency anemia returning to the clinic for follow-up of seropositive myasthenia gravis and neuropathy.   The patient was accompanied to the clinic by self.  IMPRESSION/PLAN: Seropositive bulbar myasthenia gravis without exacerbation, diagnosed 2019, thymoma negative.  MG history:  - Highest dose of prednisone is 30mg /d in 2019   - Tapering prednisone < 5mg  caused breakthrough weakness, so he has been on 5mg /d since early 2022   - January 2023 - new onset of bulbar weakness, prednisone titrated to 30mg  daily  - April 2023 - IVIG started due to bulbar symptoms   - June 2023 - IVIG adjusted to every 3 weeks, clinically improved  - August 2023 - started prednisone taper   - November 2023 - IVIG stopped   PLAN:  Continue prednisone 10mg  daily, with plans to taper late in the summer  2. Peripheral neuropathy with distal paresthesias and sensory ataxia, stable  Return to clinic in 3 months  ------------------------------------------------------------------------------  UPDATE 03/17/2022:  He is here for follow-up visit.  He is doing great with respect to MG and denies any double vision, droopiness of the eyelids, difficulty swallowing/talking, or limb weakness.  He has been able to taper prednisone down to 10mg  daily and remains off IVIG.  He has been having shortness of breath with exertion and some chest pressure, which has limited his activity level.  He is no longer doing yard work.   He has not scheduled an appointment with cardiology, but plans to do this.  Neuropathy is unchanged and he has noticed mild increased in unsteadiness/imbalance.  No falls.  He continues to walk independently.    Medications:  Current Outpatient Medications on File Prior to Visit  Medication Sig Dispense  Refill   aspirin 81 MG chewable tablet Chew 1 tablet (81 mg total) by mouth daily. 90 tablet 1   Calcium Carbonate-Vit D-Min (CALCIUM 1200 PO) Take by mouth.     ferrous gluconate (FERGON) 324 MG tablet Take 1 tablet by mouth 3 (three) times a week. Monday, Wednesday AND Friday     isosorbide mononitrate (IMDUR) 30 MG 24 hr tablet Take 0.5 tablets (15 mg total) by mouth daily. 90 tablet 3   metoprolol succinate (TOPROL-XL) 25 MG 24 hr tablet Take 0.5 tablets (12.5 mg total) by mouth daily. (Patient taking differently: Take 12.5 mg by mouth daily. 0.5 (12.5 mg ) daily) 90 tablet 1   nitroGLYCERIN (NITROSTAT) 0.4 MG SL tablet Place 1 tablet (0.4 mg total) under the tongue every 5 (five) minutes as needed for chest pain. 25 tablet 1   Pitavastatin Calcium 2 MG TABS Take 1 tablet (2 mg total) by mouth daily. 90 tablet 3   predniSONE (DELTASONE) 10 MG tablet Take 1 tablet (10 mg total) by mouth daily with breakfast. 90 tablet 3   ramipril (ALTACE) 5 MG capsule Take 1 capsule (5 mg total) by mouth daily. 90 capsule 3   ranolazine (RANEXA) 500 MG 12 hr tablet Take 1 tablet (500 mg total) by mouth 2 (two) times daily. 60 tablet 2   GAMUNEX-C 20 GM/200ML SOLN  (Patient not taking: Reported on 03/17/2022)     predniSONE (DELTASONE) 5 MG tablet Take 1 tablet (5 mg total) by mouth daily with breakfast. (Patient not taking: Reported on 11/10/2021) 90 tablet 3  No current facility-administered medications on file prior to visit.    Allergies:  Allergies  Allergen Reactions   Zetia [Ezetimibe] Other (See Comments)    Weakness, shortness of breath   Demerol [Meperidine Hcl]    Statins Other (See Comments)    myalgias    Vital Signs:  BP 130/70   Pulse 79   Ht 5\' 6"  (1.676 m)   Wt 184 lb (83.5 kg)   SpO2 98%   BMI 29.70 kg/m   Neurological Exam: MENTAL STATUS including orientation to time, place, person, recent and remote memory, attention span and concentration, language, and fund of knowledge is  normal.  There is no dysarthria.  CRANIAL NERVES:  Pupils equal round and reactive to light.  Normal conjugate, extra-ocular eye movements in all directions of gaze. Trace left ptosis.  Face is symmetric.  Facial muscles are 5/5 with orbicularis oculi and buccinator testing.   Palate elevates symmetrically.  Tongue is midline with 5/5 strength    MOTOR:  Motor strength is 5/5 in all extremities, including neck flexion. No fatigability.  COORDINATION/GAIT:   Gait narrow based and stable. He is able to stand up without using arms to push off.   Data: CT head 12/08/2016:  Unremarkable  Labs 04/20/2017:   AChR binding 9.20*, blocking 76*, and modulating 90*  CT chest wo contrast 05/04/2017:   1. Unremarkable CT examination the chest. No anterior mediastinal mass/thymoma is identified. 2. No acute pulmonary findings or worrisome pulmonary lesions. 3. Significant three-vessel coronary artery calcifications. 4. Cholelithiasis.  Labs received 06/29/2017:  TPMT  13 (normal).  Lab Results  Component Value Date   VITAMINB12 113 (L) 11/25/2021      Thank you for allowing me to participate in patient's care.  If I can answer any additional questions, I would be pleased to do so.    Sincerely,    Naylea Wigington K. Posey Pronto, DO

## 2022-03-17 NOTE — Telephone Encounter (Signed)
Pt c/o of Chest Pain: STAT if CP now or developed within 24 hours  1. Are you having CP right now? No  2. Are you experiencing any other symptoms (ex. SOB, nausea, vomiting, sweating)? SOB with exertion and aching in left arm   3. How long have you been experiencing CP? 2 1/2 months   4. Is your CP continuous or coming and going? Coming and going  5. Have you taken Nitroglycerin? Yes, took two on 11/14 and one on 01/14   Patient states he has been having SOB for the past two and a half months and has noticed it gradually getting worse which is the reason he called today. He also states he has had three episodes of chest tightness in that time frame, two causing him to have to take Nitro. Patient also c/o his left arm aching which he reports has been consistent for the past 2-3 days, but started coming and going about a month ago. Please advise.

## 2022-03-17 NOTE — Patient Instructions (Signed)
Continue prednisone 10mg  daily  I will see you in May for follow-up

## 2022-03-20 NOTE — Progress Notes (Unsigned)
Cardiology Office Note:    Date:  03/22/2022   ID:  Adrian Bell, DOB [birth year], MRN [id number]  PCP:  [facility]  Cardiologist:  Shirlee More, MD    Referring MD: [facility]    ASSESSMENT:    1. SOB (shortness of breath)   2. Coronary artery disease involving native coronary artery of native heart with unstable angina pectoris ([place])   3. Essential hypertension   4. Mixed hyperlipidemia   5. Myasthenia gravis ([place])    PLAN:    In order of problems listed above:  His predominant complaint is shortness of breath situation here is suggestive of heart failure check proBNP level along with echocardiogram decide if he needs a diuretic will increase his oral nitrates as he really is committing to avoiding bypass surgery as long as possible and continue his other medical treatment including aspirin beta-blocker and ranolazine along with his statin.  If symptoms worsen or more typical anginal referral to coronary angiography would be appropriate Hypertension is well treated controlled continue his current therapy including ACE inhibitor Poorly statin intolerant continue his intermediate intensity statin  Doing well with his myasthenia   Next appointment: 3 months    Medication Adjustments/Labs and Tests Ordered: Current medicines are reviewed at length with the patient today.  Concerns regarding medicines are outlined above.  Orders Placed This Encounter  Procedures   EKG 12-Lead   No orders of the defined types were placed in this encounter.   Chief Complaint  Patient presents with   Follow-up   Coronary Artery Disease    History of Present Illness:    Adrian Bell is a 77 y.o. male with a hx of hypertension hyperlipidemia myasthenia gravis and coronary artery disease with inferior ST segment elevation MI PCI and drug-eluting stent right coronary artery 07/21/2018 last seen 11/10/2021.  He is being seen today in response to a telephone call 03/17/2022 with  exertional shortness of breath and angina.  Compliance with diet, lifestyle and medications: Yes  His predominant complaint was shortness of breath bending over and exertional with walking outdoors he is also a little bit of orthopnea when he had his myocardial infarction was complicated by heart failure. He is aware he has peripheral edema He is improved since increasing his oral nitrate but is still not back He is not having typical angina palpitation or syncope Lipid profile 11/10/2021 LDL 123 A1c 6.3 Past Medical History:  Diagnosis Date   Dysphagia    Heart attack ([place]) 2020   Hyperlipidemia    Kidney stones    Myasthenia gravis ([place])    STEMI (ST elevation myocardial infarction) [facility])     Past Surgical History:  Procedure Laterality Date   BACK SURGERY     CARDIAC CATHETERIZATION     COLONOSCOPY  2022   CORONARY/GRAFT ACUTE MI REVASCULARIZATION N/A 07/21/2018   Procedure: Coronary/Graft Acute MI Revascularization;  Surgeon: [name], MD;  Location: [place] [facility];  Service: Cardiovascular;  Laterality: N/A;   CYSTOSCOPY WITH INSERTION OF UROLIFT  2018   LASIK     LEFT HEART CATH AND CORONARY ANGIOGRAPHY N/A 07/21/2018   Procedure: LEFT HEART CATH AND CORONARY ANGIOGRAPHY;  Surgeon: [name], MD;  Location: [place] [facility];  Service: Cardiovascular;  Laterality: N/A;   LEFT HEART CATH AND CORONARY ANGIOGRAPHY N/A 12/08/2018   Procedure: LEFT HEART CATH AND CORONARY ANGIOGRAPHY;  Surgeon: Martinique, Peter M, MD;  Location: [place] [facility];  Service: Cardiovascular;  Laterality: N/A;   LITHOTRIPSY     NECK SURGERY     TONSILLECTOMY      Current Medications: Current Meds  Medication Sig   alum & mag hydroxide-simeth (MINTOX) 200-200-20 MG/5ML suspension Take 30 mLs by mouth daily.   aspirin 81 MG chewable tablet Chew 1 tablet (81 mg total) by mouth daily.   Calcium Carbonate-Vit D-Min (CALCIUM 1200 PO) Take by mouth.   Cyanocobalamin  (VITAMIN B-12 PO) Take 1 tablet by mouth daily.   ferrous gluconate (FERGON) 324 MG tablet Take 1 tablet by mouth 3 (three) times a week. Monday, Wednesday AND Friday   isosorbide mononitrate (IMDUR) 30 MG 24 hr tablet Take 1 tablet (30 mg total) by mouth daily.   metoprolol succinate (TOPROL-XL) 25 MG 24 hr tablet Take 0.5 tablets (12.5 mg total) by mouth daily. (Patient taking differently: Take 12.5 mg by mouth daily. 0.5 (12.5 mg ) daily)   nitroGLYCERIN (NITROSTAT) 0.4 MG SL tablet Place 1 tablet (0.4 mg total) under the tongue every 5 (five) minutes as needed for chest pain.   Pitavastatin Calcium 2 MG TABS Take 1 tablet (2 mg total) by mouth daily.   predniSONE (DELTASONE) 10 MG tablet Take 1 tablet (10 mg total) by mouth daily with breakfast.   ramipril (ALTACE) 5 MG capsule Take 1 capsule (5 mg total) by mouth daily.   ranolazine (RANEXA) 500 MG 12 hr tablet Take 1 tablet (500 mg total) by mouth 2 (two) times daily.     Allergies:   Zetia [ezetimibe], Demerol [meperidine hcl], and Statins   Social History   Socioeconomic History   Marital status: Married    Spouse name: Not on file   Number of children: 3   Years of education: 14   Highest education level: Not on file  Occupational History   Occupation: retired    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  Tobacco Use   Smoking status: Never   Smokeless tobacco: Former    Types: Chew    Quit date: 2009  Vaping Use   Vaping Use: Never used  Substance and Sexual Activity   Alcohol use: Yes    Comment: occ    Drug use: No   Sexual activity: Not on file  Other Topics Concern   Not on file  Social History Narrative   Lives with wife in a one story home.  Has 3 children.     Retired Public librarian.     Education: some college.    Right handed   Drinks caffeine    Social Determinants of Health   Financial Resource Strain: Not on file  Food Insecurity: Not on file  Transportation Needs: Not on file  Physical  Activity: Not on file  Stress: Not on file  Social Connections: Not on file     Family History: The patient's family history includes Deep vein thrombosis in his brother; Diabetes in his sister; Esophageal cancer in his brother; Heart attack in his father and mother; Heart disease in his mother; Stroke in his brother and sister. ROS:   Please see the history of present illness.    All other systems reviewed and are negative.  EKGs/Labs/Other Studies Reviewed:    The following studies were reviewed today:  EKG:  EKG ordered today and personally reviewed.  The ekg ordered today demonstrates sinus rhythm right bundle branch block old inferior infarction  Recent Labs: 11/25/2021: TSH 2.32  Recent Lipid Panel    Component Value Date/Time   CHOL  216 (H) 11/10/2021 0859   TRIG 202 (H) 11/10/2021 0859   HDL 58 11/10/2021 0859   CHOLHDL 3.7 11/10/2021 0859   CHOLHDL 3.1 07/21/2018 2254   VLDL 25 07/21/2018 2254   LDLCALC 123 (H) 11/10/2021 0859    Physical Exam:    VS:  BP 134/64 (BP Location: Right Arm, Patient Position: Sitting)   Pulse 73   Ht 5\' 6"  (1.676 m)   Wt 184 lb (83.5 kg)   SpO2 99%   BMI 29.70 kg/m     Wt Readings from Last 3 Encounters:  03/22/22 184 lb (83.5 kg)  03/17/22 184 lb (83.5 kg)  11/25/21 187 lb (84.8 kg)     GEN:  Well nourished, well developed in no acute distress HEENT: Normal NECK: No JVD; No carotid bruits LYMPHATICS: No lymphadenopathy CARDIAC: RRR, no murmurs, rubs, gallops RESPIRATORY:  Clear to auscultation without rales, wheezing or rhonchi  ABDOMEN: Soft, non-tender, non-distended MUSCULOSKELETAL:   he has 2-3+ bilateral lower extremity pitting edema; No deformity  SKIN: Warm and dry NEUROLOGIC:  Alert and oriented x 3 PSYCHIATRIC:  Normal affect    Signed, Shirlee More, MD  03/22/2022 9:16 AM    [place]

## 2022-03-22 ENCOUNTER — Ambulatory Visit: Payer: Medicare HMO | Attending: Cardiology | Admitting: Cardiology

## 2022-03-22 ENCOUNTER — Encounter: Payer: Self-pay | Admitting: Cardiology

## 2022-03-22 VITALS — BP 134/64 | HR 73 | Ht 66.0 in | Wt 184.0 lb

## 2022-03-22 DIAGNOSIS — E782 Mixed hyperlipidemia: Secondary | ICD-10-CM

## 2022-03-22 DIAGNOSIS — I10 Essential (primary) hypertension: Secondary | ICD-10-CM

## 2022-03-22 DIAGNOSIS — I25110 Atherosclerotic heart disease of native coronary artery with unstable angina pectoris: Secondary | ICD-10-CM

## 2022-03-22 DIAGNOSIS — R0602 Shortness of breath: Secondary | ICD-10-CM

## 2022-03-22 DIAGNOSIS — G7000 Myasthenia gravis without (acute) exacerbation: Secondary | ICD-10-CM

## 2022-03-22 MED ORDER — ISOSORBIDE MONONITRATE ER 60 MG PO TB24: 60.0000 mg | ORAL_TABLET | Freq: Every day | ORAL | 3 refills | Status: DC

## 2022-03-22 NOTE — Patient Instructions (Signed)
Medication Instructions:  Your physician has recommended you make the following change in your medication:  Increase Imdur to 60 mg once daily  *If you need a refill on your cardiac medications before your next appointment, please call your pharmacy*   Lab Work: Your physician recommends that you return for lab work in: Today for a Pro BNP  If you have labs (blood work) drawn today and your tests are completely normal, you will receive your results only by: MyChart Message (if you have MyChart) OR A paper copy in the mail If you have any lab test that is abnormal or we need to change your treatment, we will call you to review the results.   Testing/Procedures: Your physician has requested that you have an echocardiogram. Echocardiography is a painless test that uses sound waves to create images of your heart. It provides your doctor with information about the size and shape of your heart and how well your heart's chambers and valves are working. This procedure takes approximately one hour. There are no restrictions for this procedure. Please do NOT wear cologne, perfume, aftershave, or lotions (deodorant is allowed). Please arrive 15 minutes prior to your appointment time.    Follow-Up: At [facility], you and your health needs are our priority.  As part of our continuing mission to provide you with exceptional heart care, we have created designated Provider Care Teams.  These Care Teams include your primary Cardiologist (physician) and Advanced Practice Providers (APPs -  Physician Assistants and Nurse Practitioners) who all work together to provide you with the care you need, when you need it.  We recommend signing up for the patient portal called "MyChart".  Sign up information is provided on this After Visit Summary.  MyChart is used to connect with patients for Virtual Visits (Telemedicine).  Patients are able to view lab/test results, encounter notes, upcoming appointments,  etc.  Non-urgent messages can be sent to your provider as well.   To learn more about what you can do with MyChart, go to [email or web address].    Your next appointment:   3 month(s)  Provider:   Shirlee More, MD    Other Instructions P[facility] YOU COME

## 2022-03-23 ENCOUNTER — Ambulatory Visit: Payer: Medicare HMO | Attending: Cardiology

## 2022-03-23 DIAGNOSIS — G7000 Myasthenia gravis without (acute) exacerbation: Secondary | ICD-10-CM | POA: Diagnosis not present

## 2022-03-23 DIAGNOSIS — I25110 Atherosclerotic heart disease of native coronary artery with unstable angina pectoris: Secondary | ICD-10-CM

## 2022-03-23 DIAGNOSIS — R0602 Shortness of breath: Secondary | ICD-10-CM | POA: Diagnosis not present

## 2022-03-23 DIAGNOSIS — I10 Essential (primary) hypertension: Secondary | ICD-10-CM

## 2022-03-23 DIAGNOSIS — E782 Mixed hyperlipidemia: Secondary | ICD-10-CM

## 2022-03-23 LAB — ECHOCARDIOGRAM COMPLETE
Area-P 1/2: 3.28 cm2
S' Lateral: 2.9 cm

## 2022-03-24 LAB — PRO B NATRIURETIC PEPTIDE: NT-Pro BNP: 71 pg/mL (ref 0–486)

## 2022-04-14 DIAGNOSIS — E876 Hypokalemia: Secondary | ICD-10-CM | POA: Diagnosis not present

## 2022-04-14 DIAGNOSIS — I201 Angina pectoris with documented spasm: Secondary | ICD-10-CM | POA: Diagnosis not present

## 2022-04-14 DIAGNOSIS — I2510 Atherosclerotic heart disease of native coronary artery without angina pectoris: Secondary | ICD-10-CM | POA: Diagnosis not present

## 2022-04-14 DIAGNOSIS — I219 Acute myocardial infarction, unspecified: Secondary | ICD-10-CM | POA: Diagnosis not present

## 2022-04-14 DIAGNOSIS — I4510 Unspecified right bundle-branch block: Secondary | ICD-10-CM | POA: Diagnosis not present

## 2022-04-14 DIAGNOSIS — R9431 Abnormal electrocardiogram [ECG] [EKG]: Secondary | ICD-10-CM | POA: Diagnosis not present

## 2022-04-14 DIAGNOSIS — I214 Non-ST elevation (NSTEMI) myocardial infarction: Secondary | ICD-10-CM | POA: Diagnosis not present

## 2022-04-14 DIAGNOSIS — I252 Old myocardial infarction: Secondary | ICD-10-CM | POA: Diagnosis not present

## 2022-04-14 DIAGNOSIS — R079 Chest pain, unspecified: Secondary | ICD-10-CM | POA: Diagnosis not present

## 2022-04-15 ENCOUNTER — Inpatient Hospital Stay (HOSPITAL_COMMUNITY)
Admission: RE | Admit: 2022-04-15 | Discharge: 2022-04-30 | DRG: 232 | Disposition: A | Discharge status: Home / Self Care | Payer: Non-veteran care | Attending: Cardiothoracic Surgery | Admitting: Cardiothoracic Surgery

## 2022-04-15 ENCOUNTER — Encounter (HOSPITAL_COMMUNITY): Payer: Self-pay

## 2022-04-15 DIAGNOSIS — T380X5A Adverse effect of glucocorticoids and synthetic analogues, initial encounter: Secondary | ICD-10-CM | POA: Diagnosis not present

## 2022-04-15 DIAGNOSIS — G709 Myoneural disorder, unspecified: Secondary | ICD-10-CM | POA: Diagnosis not present

## 2022-04-15 DIAGNOSIS — Z9089 Acquired absence of other organs: Secondary | ICD-10-CM

## 2022-04-15 DIAGNOSIS — Z79899 Other long term (current) drug therapy: Secondary | ICD-10-CM

## 2022-04-15 DIAGNOSIS — I700 Atherosclerosis of aorta: Secondary | ICD-10-CM | POA: Diagnosis not present

## 2022-04-15 DIAGNOSIS — I2510 Atherosclerotic heart disease of native coronary artery without angina pectoris: Secondary | ICD-10-CM | POA: Diagnosis not present

## 2022-04-15 DIAGNOSIS — R9431 Abnormal electrocardiogram [ECG] [EKG]: Secondary | ICD-10-CM | POA: Diagnosis not present

## 2022-04-15 DIAGNOSIS — I214 Non-ST elevation (NSTEMI) myocardial infarction: Principal | ICD-10-CM | POA: Diagnosis present

## 2022-04-15 DIAGNOSIS — Z7952 Long term (current) use of systemic steroids: Secondary | ICD-10-CM

## 2022-04-15 DIAGNOSIS — Z955 Presence of coronary angioplasty implant and graft: Secondary | ICD-10-CM | POA: Diagnosis not present

## 2022-04-15 DIAGNOSIS — G4733 Obstructive sleep apnea (adult) (pediatric): Secondary | ICD-10-CM | POA: Diagnosis not present

## 2022-04-15 DIAGNOSIS — A419 Sepsis, unspecified organism: Secondary | ICD-10-CM | POA: Diagnosis not present

## 2022-04-15 DIAGNOSIS — R0602 Shortness of breath: Secondary | ICD-10-CM | POA: Diagnosis not present

## 2022-04-15 DIAGNOSIS — Z01810 Encounter for preprocedural cardiovascular examination: Secondary | ICD-10-CM | POA: Diagnosis not present

## 2022-04-15 DIAGNOSIS — G7000 Myasthenia gravis without (acute) exacerbation: Secondary | ICD-10-CM | POA: Diagnosis not present

## 2022-04-15 DIAGNOSIS — Y831 Surgical operation with implant of artificial internal device as the cause of abnormal reaction of the patient, or of later complication, without mention of misadventure at the time of the procedure: Secondary | ICD-10-CM | POA: Diagnosis present

## 2022-04-15 DIAGNOSIS — I252 Old myocardial infarction: Secondary | ICD-10-CM

## 2022-04-15 DIAGNOSIS — J9811 Atelectasis: Secondary | ICD-10-CM | POA: Diagnosis not present

## 2022-04-15 DIAGNOSIS — I10 Essential (primary) hypertension: Secondary | ICD-10-CM | POA: Diagnosis present

## 2022-04-15 DIAGNOSIS — K2100 Gastro-esophageal reflux disease with esophagitis, without bleeding: Secondary | ICD-10-CM | POA: Diagnosis present

## 2022-04-15 DIAGNOSIS — Z9911 Dependence on respirator [ventilator] status: Secondary | ICD-10-CM | POA: Diagnosis not present

## 2022-04-15 DIAGNOSIS — E785 Hyperlipidemia, unspecified: Secondary | ICD-10-CM | POA: Diagnosis not present

## 2022-04-15 DIAGNOSIS — D649 Anemia, unspecified: Secondary | ICD-10-CM | POA: Diagnosis not present

## 2022-04-15 DIAGNOSIS — K8020 Calculus of gallbladder without cholecystitis without obstruction: Secondary | ICD-10-CM | POA: Diagnosis not present

## 2022-04-15 DIAGNOSIS — D62 Acute posthemorrhagic anemia: Secondary | ICD-10-CM | POA: Diagnosis not present

## 2022-04-15 DIAGNOSIS — D8481 Immunodeficiency due to conditions classified elsewhere: Secondary | ICD-10-CM | POA: Diagnosis not present

## 2022-04-15 DIAGNOSIS — K567 Ileus, unspecified: Secondary | ICD-10-CM | POA: Diagnosis present

## 2022-04-15 DIAGNOSIS — D6959 Other secondary thrombocytopenia: Secondary | ICD-10-CM | POA: Diagnosis not present

## 2022-04-15 DIAGNOSIS — Z833 Family history of diabetes mellitus: Secondary | ICD-10-CM

## 2022-04-15 DIAGNOSIS — Z01818 Encounter for other preprocedural examination: Secondary | ICD-10-CM | POA: Diagnosis not present

## 2022-04-15 DIAGNOSIS — R1310 Dysphagia, unspecified: Secondary | ICD-10-CM | POA: Diagnosis present

## 2022-04-15 DIAGNOSIS — J811 Chronic pulmonary edema: Secondary | ICD-10-CM | POA: Diagnosis not present

## 2022-04-15 DIAGNOSIS — R918 Other nonspecific abnormal finding of lung field: Secondary | ICD-10-CM | POA: Diagnosis not present

## 2022-04-15 DIAGNOSIS — Z87891 Personal history of nicotine dependence: Secondary | ICD-10-CM

## 2022-04-15 DIAGNOSIS — E876 Hypokalemia: Secondary | ICD-10-CM | POA: Diagnosis not present

## 2022-04-15 DIAGNOSIS — R739 Hyperglycemia, unspecified: Secondary | ICD-10-CM | POA: Diagnosis not present

## 2022-04-15 DIAGNOSIS — R7881 Bacteremia: Secondary | ICD-10-CM | POA: Diagnosis not present

## 2022-04-15 DIAGNOSIS — T859XXA Unspecified complication of internal prosthetic device, implant and graft, initial encounter: Secondary | ICD-10-CM | POA: Diagnosis not present

## 2022-04-15 DIAGNOSIS — B9689 Other specified bacterial agents as the cause of diseases classified elsewhere: Secondary | ICD-10-CM | POA: Diagnosis not present

## 2022-04-15 DIAGNOSIS — I219 Acute myocardial infarction, unspecified: Secondary | ICD-10-CM | POA: Diagnosis not present

## 2022-04-15 DIAGNOSIS — I517 Cardiomegaly: Secondary | ICD-10-CM | POA: Diagnosis not present

## 2022-04-15 DIAGNOSIS — R509 Fever, unspecified: Secondary | ICD-10-CM | POA: Diagnosis not present

## 2022-04-15 DIAGNOSIS — I201 Angina pectoris with documented spasm: Secondary | ICD-10-CM | POA: Diagnosis not present

## 2022-04-15 DIAGNOSIS — I25110 Atherosclerotic heart disease of native coronary artery with unstable angina pectoris: Secondary | ICD-10-CM | POA: Diagnosis present

## 2022-04-15 DIAGNOSIS — J90 Pleural effusion, not elsewhere classified: Secondary | ICD-10-CM | POA: Diagnosis not present

## 2022-04-15 DIAGNOSIS — Z888 Allergy status to other drugs, medicaments and biological substances status: Secondary | ICD-10-CM

## 2022-04-15 DIAGNOSIS — Z1152 Encounter for screening for COVID-19: Secondary | ICD-10-CM | POA: Diagnosis not present

## 2022-04-15 DIAGNOSIS — Z87438 Personal history of other diseases of male genital organs: Secondary | ICD-10-CM

## 2022-04-15 DIAGNOSIS — I4510 Unspecified right bundle-branch block: Secondary | ICD-10-CM | POA: Diagnosis present

## 2022-04-15 DIAGNOSIS — R600 Localized edema: Secondary | ICD-10-CM | POA: Diagnosis not present

## 2022-04-15 DIAGNOSIS — N200 Calculus of kidney: Secondary | ICD-10-CM | POA: Diagnosis not present

## 2022-04-15 DIAGNOSIS — Z87442 Personal history of urinary calculi: Secondary | ICD-10-CM

## 2022-04-15 DIAGNOSIS — I249 Acute ischemic heart disease, unspecified: Secondary | ICD-10-CM | POA: Diagnosis not present

## 2022-04-15 DIAGNOSIS — Z800 Family history of malignant neoplasm of digestive organs: Secondary | ICD-10-CM

## 2022-04-15 DIAGNOSIS — Z8249 Family history of ischemic heart disease and other diseases of the circulatory system: Secondary | ICD-10-CM

## 2022-04-15 DIAGNOSIS — Z951 Presence of aortocoronary bypass graft: Secondary | ICD-10-CM

## 2022-04-15 DIAGNOSIS — I3139 Other pericardial effusion (noninflammatory): Secondary | ICD-10-CM | POA: Diagnosis not present

## 2022-04-15 DIAGNOSIS — Z823 Family history of stroke: Secondary | ICD-10-CM

## 2022-04-15 DIAGNOSIS — R079 Chest pain, unspecified: Secondary | ICD-10-CM | POA: Diagnosis not present

## 2022-04-15 DIAGNOSIS — Z7982 Long term (current) use of aspirin: Secondary | ICD-10-CM

## 2022-04-15 HISTORY — DX: Non-ST elevation (NSTEMI) myocardial infarction: I21.4

## 2022-04-15 LAB — MRSA NEXT GEN BY PCR, NASAL: MRSA by PCR Next Gen: NOT DETECTED

## 2022-04-15 MED ORDER — ACETAMINOPHEN 325 MG PO TABS: 650.0000 mg | ORAL_TABLET | ORAL | Status: DC | PRN

## 2022-04-15 MED ORDER — NITROGLYCERIN 0.4 MG SL SUBL: 0.4000 mg | SUBLINGUAL_TABLET | SUBLINGUAL | Status: DC | PRN

## 2022-04-15 MED ORDER — ONDANSETRON HCL 4 MG/2ML IJ SOLN: 4.0000 mg | Freq: Four times a day (QID) | INTRAMUSCULAR | Status: DC | PRN

## 2022-04-15 MED ORDER — ASPIRIN 81 MG PO TBEC: 81.0000 mg | DELAYED_RELEASE_TABLET | Freq: Every day | ORAL | Status: DC

## 2022-04-16 ENCOUNTER — Encounter (HOSPITAL_COMMUNITY): Payer: Self-pay | Admitting: Interventional Cardiology

## 2022-04-16 ENCOUNTER — Other Ambulatory Visit (HOSPITAL_COMMUNITY): Payer: Medicare HMO

## 2022-04-16 ENCOUNTER — Encounter (HOSPITAL_COMMUNITY)
Admission: RE | Disposition: A | Discharge status: Home / Self Care | Payer: Self-pay | Source: Home / Self Care | Attending: Cardiothoracic Surgery

## 2022-04-16 DIAGNOSIS — I214 Non-ST elevation (NSTEMI) myocardial infarction: Secondary | ICD-10-CM | POA: Diagnosis not present

## 2022-04-16 DIAGNOSIS — I25110 Atherosclerotic heart disease of native coronary artery with unstable angina pectoris: Secondary | ICD-10-CM

## 2022-04-16 DIAGNOSIS — G7000 Myasthenia gravis without (acute) exacerbation: Secondary | ICD-10-CM | POA: Diagnosis not present

## 2022-04-16 HISTORY — PX: LEFT HEART CATH AND CORONARY ANGIOGRAPHY: CATH118249

## 2022-04-16 LAB — CBC
HCT: 31.3 % — ABNORMAL LOW (ref 39.0–52.0)
Hemoglobin: 10.3 g/dL — ABNORMAL LOW (ref 13.0–17.0)
MCH: 31.5 pg (ref 26.0–34.0)
MCHC: 32.9 g/dL (ref 30.0–36.0)
MCV: 95.7 fL (ref 80.0–100.0)
Platelets: 257 10*3/uL (ref 150–400)
RBC: 3.27 MIL/uL — ABNORMAL LOW (ref 4.22–5.81)
RDW: 13.8 % (ref 11.5–15.5)
WBC: 7.9 10*3/uL (ref 4.0–10.5)
nRBC: 0 % (ref 0.0–0.2)

## 2022-04-16 LAB — HEPARIN LEVEL (UNFRACTIONATED): Heparin Unfractionated: 0.61 IU/mL (ref 0.30–0.70)

## 2022-04-16 LAB — IRON AND TIBC
Iron: 37 ug/dL — ABNORMAL LOW (ref 45–182)
Saturation Ratios: 11 % — ABNORMAL LOW (ref 17.9–39.5)
TIBC: 326 ug/dL (ref 250–450)
UIBC: 289 ug/dL

## 2022-04-16 LAB — BASIC METABOLIC PANEL
Anion gap: 12 (ref 5–15)
BUN: 17 mg/dL (ref 8–23)
CO2: 22 mmol/L (ref 22–32)
Calcium: 8.7 mg/dL — ABNORMAL LOW (ref 8.9–10.3)
Chloride: 108 mmol/L (ref 98–111)
Creatinine, Ser: 1.07 mg/dL (ref 0.61–1.24)
GFR, Estimated: >60 mL/min (ref >60)
Glucose, Bld: 94 mg/dL (ref 70–99)
Potassium: 4 mmol/L (ref 3.5–5.1)
Sodium: 142 mmol/L (ref 135–145)

## 2022-04-16 LAB — FOLATE: Folate: 13 ng/mL (ref >5.9)

## 2022-04-16 LAB — VITAMIN B12: Vitamin B-12: 259 pg/mL (ref 180–914)

## 2022-04-16 LAB — FERRITIN: Ferritin: 29 ng/mL (ref 24–336)

## 2022-04-16 SURGERY — LEFT HEART CATH AND CORONARY ANGIOGRAPHY
Anesthesia: LOCAL

## 2022-04-16 MED ORDER — SODIUM CHLORIDE 0.9 % IV SOLN: 250.0000 mL | INTRAVENOUS | Status: DC | PRN

## 2022-04-16 MED ORDER — ASPIRIN 81 MG PO TBEC
81.0000 mg | DELAYED_RELEASE_TABLET | Freq: Every day | ORAL | Status: DC
  Administered 2022-04-17 – 2022-04-18 (×2): 81 mg via ORAL
  Filled 2022-04-16 (×2): qty 1

## 2022-04-16 MED ORDER — SODIUM CHLORIDE 0.9% FLUSH: 3.0000 mL | INTRAVENOUS | Status: DC | PRN

## 2022-04-16 MED ORDER — LIDOCAINE HCL (PF) 1 % IJ SOLN
INTRAMUSCULAR | Status: AC
  Filled 2022-04-16: qty 30

## 2022-04-16 MED ORDER — VERAPAMIL HCL 2.5 MG/ML IV SOLN
INTRAVENOUS | Status: DC | PRN
  Administered 2022-04-16 (×2): 10 mL via INTRA_ARTERIAL

## 2022-04-16 MED ORDER — HEPARIN SODIUM (PORCINE) 1000 UNIT/ML IJ SOLN
INTRAMUSCULAR | Status: AC
  Filled 2022-04-16: qty 10

## 2022-04-16 MED ORDER — HEPARIN (PORCINE) IN NACL 1000-0.9 UT/500ML-% IV SOLN
INTRAVENOUS | Status: DC | PRN
  Administered 2022-04-16 (×2): 500 mL

## 2022-04-16 MED ORDER — MIDAZOLAM HCL 2 MG/2ML IJ SOLN
INTRAMUSCULAR | Status: DC | PRN
  Administered 2022-04-16: 2 mg via INTRAVENOUS

## 2022-04-16 MED ORDER — FENTANYL CITRATE (PF) 100 MCG/2ML IJ SOLN
INTRAMUSCULAR | Status: AC
  Filled 2022-04-16: qty 2

## 2022-04-16 MED ORDER — PREDNISONE 10 MG PO TABS
10.0000 mg | ORAL_TABLET | Freq: Every day | ORAL | Status: DC
  Administered 2022-04-17 – 2022-04-18 (×2): 10 mg via ORAL
  Filled 2022-04-16 (×2): qty 1

## 2022-04-16 MED ORDER — ACETAMINOPHEN 325 MG PO TABS: 650.0000 mg | ORAL_TABLET | ORAL | Status: DC | PRN

## 2022-04-16 MED ORDER — HYDRALAZINE HCL 20 MG/ML IJ SOLN: 10.0000 mg | INTRAMUSCULAR | Status: AC | PRN

## 2022-04-16 MED ORDER — HEPARIN (PORCINE) 25000 UT/250ML-% IV SOLN
950.0000 [IU]/h | INTRAVENOUS | Status: DC
  Administered 2022-04-16 – 2022-04-18 (×3): 950 [IU]/h via INTRAVENOUS
  Filled 2022-04-16 (×3): qty 250

## 2022-04-16 MED ORDER — IOHEXOL 350 MG/ML SOLN
INTRAVENOUS | Status: DC | PRN
  Administered 2022-04-16: 70 mL

## 2022-04-16 MED ORDER — ONDANSETRON HCL 4 MG/2ML IJ SOLN: 4.0000 mg | Freq: Four times a day (QID) | INTRAMUSCULAR | Status: DC | PRN

## 2022-04-16 MED ORDER — HEPARIN SODIUM (PORCINE) 1000 UNIT/ML IJ SOLN
INTRAMUSCULAR | Status: DC | PRN
  Administered 2022-04-16: 4000 [IU] via INTRAVENOUS

## 2022-04-16 MED ORDER — METOPROLOL SUCCINATE ER 25 MG PO TB24
12.5000 mg | ORAL_TABLET | Freq: Every day | ORAL | Status: DC
  Administered 2022-04-16 – 2022-04-19 (×4): 12.5 mg via ORAL
  Filled 2022-04-16 (×4): qty 1

## 2022-04-16 MED ORDER — HEPARIN (PORCINE) 25000 UT/250ML-% IV SOLN
1000.0000 [IU]/h | INTRAVENOUS | Status: DC
  Administered 2022-04-16: 1000 [IU]/h via INTRAVENOUS
  Filled 2022-04-16: qty 250

## 2022-04-16 MED ORDER — SODIUM CHLORIDE 0.9 % WEIGHT BASED INFUSION: 3.0000 mL/kg/h | INTRAVENOUS | Status: DC

## 2022-04-16 MED ORDER — SODIUM CHLORIDE 0.9 % IV SOLN: INTRAVENOUS | Status: AC

## 2022-04-16 MED ORDER — VERAPAMIL HCL 2.5 MG/ML IV SOLN
INTRAVENOUS | Status: AC
  Filled 2022-04-16: qty 2

## 2022-04-16 MED ORDER — SODIUM CHLORIDE 0.9% FLUSH: 3.0000 mL | Freq: Two times a day (BID) | INTRAVENOUS | Status: DC

## 2022-04-16 MED ORDER — MIDAZOLAM HCL 2 MG/2ML IJ SOLN
INTRAMUSCULAR | Status: AC
  Filled 2022-04-16: qty 2

## 2022-04-16 MED ORDER — FENTANYL CITRATE PF 50 MCG/ML IJ SOSY
12.5000 ug | PREFILLED_SYRINGE | Freq: Once | INTRAMUSCULAR | Status: AC
  Administered 2022-04-16: 12.5 ug via INTRAVENOUS
  Filled 2022-04-16: qty 1

## 2022-04-16 MED ORDER — RANOLAZINE ER 500 MG PO TB12
500.0000 mg | ORAL_TABLET | Freq: Two times a day (BID) | ORAL | Status: DC
  Administered 2022-04-16 – 2022-04-18 (×6): 500 mg via ORAL
  Filled 2022-04-16 (×6): qty 1

## 2022-04-16 MED ORDER — ASPIRIN 81 MG PO CHEW
81.0000 mg | CHEWABLE_TABLET | ORAL | Status: AC
  Administered 2022-04-16: 81 mg via ORAL
  Filled 2022-04-16: qty 1

## 2022-04-16 MED ORDER — RAMIPRIL 5 MG PO CAPS
5.0000 mg | ORAL_CAPSULE | Freq: Every day | ORAL | Status: DC
  Administered 2022-04-17 – 2022-04-18 (×2): 5 mg via ORAL
  Filled 2022-04-16 (×4): qty 1

## 2022-04-16 MED ORDER — LIDOCAINE HCL (PF) 1 % IJ SOLN
INTRAMUSCULAR | Status: DC | PRN
  Administered 2022-04-16: 2 mL

## 2022-04-16 MED ORDER — SODIUM CHLORIDE 0.9% FLUSH
3.0000 mL | Freq: Two times a day (BID) | INTRAVENOUS | Status: DC
  Administered 2022-04-16 – 2022-04-18 (×6): 3 mL via INTRAVENOUS

## 2022-04-16 MED ORDER — ISOSORBIDE MONONITRATE ER 60 MG PO TB24
60.0000 mg | ORAL_TABLET | Freq: Every day | ORAL | Status: DC
  Administered 2022-04-16 – 2022-04-18 (×3): 60 mg via ORAL
  Filled 2022-04-16 (×3): qty 1

## 2022-04-16 MED ORDER — SODIUM CHLORIDE 0.9 % WEIGHT BASED INFUSION
1.0000 mL/kg/h | INTRAVENOUS | Status: DC
  Administered 2022-04-16: 1 mL/kg/h via INTRAVENOUS

## 2022-04-16 MED ORDER — PRAVASTATIN SODIUM 40 MG PO TABS
40.0000 mg | ORAL_TABLET | Freq: Every day | ORAL | Status: DC
  Filled 2022-04-16: qty 1

## 2022-04-16 MED ORDER — FENTANYL CITRATE (PF) 100 MCG/2ML IJ SOLN
INTRAMUSCULAR | Status: DC | PRN
  Administered 2022-04-16: 25 ug via INTRAVENOUS

## 2022-04-16 SURGICAL SUPPLY — 10 items
CATH 5FR JL3.5 JR4 ANG PIG MP (CATHETERS) IMPLANT
CATH INFINITI 5 FR AR1 MOD (CATHETERS) IMPLANT
ELECT DEFIB PAD ADLT CADENCE (PAD) IMPLANT
GLIDESHEATH SLEND SS 6F .021 (SHEATH) IMPLANT
GUIDEWIRE INQWIRE 1.5J.035X260 (WIRE) IMPLANT
INQWIRE 1.5J .035X260CM (WIRE) ×1
KIT HEART LEFT (KITS) ×1 IMPLANT
PACK CARDIAC CATHETERIZATION (CUSTOM PROCEDURE TRAY) ×1 IMPLANT
TRANSDUCER W/STOPCOCK (MISCELLANEOUS) ×1 IMPLANT
TUBING CIL FLEX 10 FLL-RA (TUBING) ×1 IMPLANT

## 2022-04-16 NOTE — TOC Progression Note (Addendum)
Transition of Care [facility]) - Progression Note    Patient Details  Name: Terreance Valcourt MRN: [id number] Date of Birth: 1946-02-02  [facility] CM/SW Contact  Zenon Mayo, RN Phone Number: 04/16/2022, 3:50 PM  Clinical Narrative:    NCM notified the [facility] notification that patient is here, the ID number is [id number].   From home, [place].  TOC following.        Expected Discharge Plan and Services                                               Social Determinants of Health (SDOH) Interventions SDOH Screenings   Tobacco Use: Medium Risk (04/16/2022)    Readmission Risk Interventions     No data to display

## 2022-04-16 NOTE — Consult Note (Cosign Needed)
[address]       [place] [address]             [phone]        Manraj Stawicki [place] Medical Record [id number] Date of Birth: [birth year]  Referring: Ned Clines* Primary Care: [facility] Primary Cardiologist:Brian Bettina Gavia, MD  Chief Complaint:   Chest pain  History of Present Illness:    The patient is a 77 year old male who presented to [facility] emergency department with left-sided chest pain.  Pain radiates to left arm with minimal substernal discomfort.  Reportedly he has been having some symptoms for the past several months.  The pain on today's date was described as severe.  He took 3 sublingual nitroglycerin which provided pain relief.  A CTA was negative for dissection.  Patient recently saw Dr. Bettina Gavia on 03/22/2022 for symptoms of shortness of breath and echocardiogram revealed normal LV and RV function, grade 1 diastolic dysfunction and no significant valvular disease.  Cardiac risk factors include history of CAD, HLD, and hypertension.  He previously underwent coronary angiogram and October 2020 which showed complex three-vessel CAD with severely calcified arteries.  This included a 70% ostial LAD, segmental 75% mid LAD, occluded first and second diagonals, 50% ostial and mid circumflex lesions, 70% ostial large OM lesion and a 70% ostial RCA, 80% distal RCA stenosis at the bifurcation.  He was transferred to [facility] with the diagnosis of unstable angina.  He has previously declined CABG in the past.  Additional significant medical history includes a normocytic anemia and myasthenia gravis for which she is on prednisone.  He has no history of cigarette use but has used smokeless tobacco.  A repeat cardiac catheterization was performed today and these results are described below.  The primary change in anatomy from the previous study in 2020 is in the 99% lesion in the obtuse marginal.  We are asked to see the patient in CT surgical  consultation for consideration of CABG.    Current Activity/ Functional Status: Patient was independent with mobility/ambulation, transfers, ADL's, IADL's.   Zubrod Score: At the time of surgery this patient's most appropriate activity status/level should be described as: '[]'$     0    Normal activity, no symptoms '[]'$     1    Restricted in physical strenuous activity but ambulatory, able to do out light work '[]'$     2    Ambulatory and capable of self care, unable to do work activities, up and about                 more than 50%  Of the time                            '[]'$     3    Only limited self care, in bed greater than 50% of waking hours '[]'$     4    Completely disabled, no self care, confined to bed or chair '[]'$     5    Moribund  Past Medical History:  Diagnosis Date   Dysphagia    Heart attack ([place]) 2020   Hyperlipidemia    Kidney stones    Myasthenia gravis ([place])    STEMI (ST elevation myocardial infarction) ([place])     Past Surgical History:  Procedure Laterality Date   BACK SURGERY     CARDIAC CATHETERIZATION     COLONOSCOPY  2022   CORONARY/GRAFT ACUTE MI REVASCULARIZATION N/A 07/21/2018   Procedure: Coronary/Graft Acute MI Revascularization;  Surgeon: [name], MD;  Location: [place] [facility];  Service: Cardiovascular;  Laterality: N/A;   CYSTOSCOPY WITH INSERTION OF UROLIFT  2018   LASIK     LEFT HEART CATH AND CORONARY ANGIOGRAPHY N/A 07/21/2018   Procedure: LEFT HEART CATH AND CORONARY ANGIOGRAPHY;  Surgeon: [name], MD;  Location: [place] [facility];  Service: Cardiovascular;  Laterality: N/A;   LEFT HEART CATH AND CORONARY ANGIOGRAPHY N/A 12/08/2018   Procedure: LEFT HEART CATH AND CORONARY ANGIOGRAPHY;  Surgeon: Martinique, Peter M, MD;  Location: [place] [facility];  Service: Cardiovascular;  Laterality: N/A;   LITHOTRIPSY     NECK SURGERY     TONSILLECTOMY      Social History   Tobacco Use  Smoking Status Never  Smokeless Tobacco Former    Types: Chew   Quit date: 2009    Social History   Substance and Sexual Activity  Alcohol Use Yes   Comment: occ      Allergies  Allergen Reactions   Zetia [Ezetimibe] Other (See Comments)    Weakness, shortness of breath   Demerol [Meperidine Hcl]    Statins Other (See Comments)    myalgias    Current Facility-Administered Medications  Medication Dose Route Frequency Provider Last Rate Last Admin   0.9 %  sodium chloride infusion   Intravenous Continuous [name], MD 75 mL/hr at 04/16/22 1119 New Bag at 04/16/22 1119   0.9 %  sodium chloride infusion  250 mL Intravenous PRN [name], MD       acetaminophen (TYLENOL) tablet 650 mg  650 mg Oral Q4H PRN [name], MD       [START ON 04/17/2022] aspirin EC tablet 81 mg  81 mg Oral Daily [name], MD       hydrALAZINE (APRESOLINE) injection 10 mg  10 mg Intravenous Q20 Min PRN [name], MD       isosorbide mononitrate (IMDUR) 24 hr tablet 60 mg  60 mg Oral Daily [name], MD   60 mg at 04/16/22 [id number]   metoprolol succinate (TOPROL-XL) 24 hr tablet 12.5 mg  12.5 mg Oral Daily [name], MD   12.5 mg at 04/16/22 0958   nitroGLYCERIN (NITROSTAT) SL tablet 0.4 mg  0.4 mg Sublingual Q5 Min x 3 PRN [name], MD       ondansetron [facility]) injection 4 mg  4 mg Intravenous Q6H PRN [name], MD       pravastatin (PRAVACHOL) tablet 40 mg  40 mg Oral q1800 [name], MD       predniSONE (DELTASONE) tablet 10 mg  10 mg Oral Q breakfast [name], MD       ramipril (ALTACE) capsule 5 mg  5 mg Oral Daily [name], MD       ranolazine (RANEXA) 12 hr tablet 500 mg  500 mg Oral BID [name] S, MD   500 mg at 04/16/22 0959   sodium chloride flush (NS) 0.9 % injection 3 mL  3 mL Intravenous Q12H [name], MD       sodium chloride flush (NS) 0.9 % injection 3 mL  3 mL Intravenous PRN [name], MD        Medications Prior to Admission  Medication Sig Dispense Refill Last Dose  alum & mag hydroxide-simeth (MINTOX) I037812 MG/5ML suspension Take 30 mLs by mouth daily.      aspirin 81 MG chewable tablet Chew 1 tablet (81 mg total) by mouth daily. 90 tablet 1    Calcium Carbonate-Vit D-Min (CALCIUM 1200 PO) Take by mouth.      Cyanocobalamin (VITAMIN B-12 PO) Take 1 tablet by mouth daily.      ferrous gluconate (FERGON) 324 MG tablet Take 1 tablet by mouth 3 (three) times a week. Monday, Wednesday AND Friday      isosorbide mononitrate (IMDUR) 60 MG 24 hr tablet Take 1 tablet (60 mg total) by mouth daily. 90 tablet 3    metoprolol succinate (TOPROL-XL) 25 MG 24 hr tablet Take 0.5 tablets (12.5 mg total) by mouth daily. (Patient taking differently: Take 12.5 mg by mouth daily. 0.5 (12.5 mg ) daily) 90 tablet 1    nitroGLYCERIN (NITROSTAT) 0.4 MG SL tablet Place 1 tablet (0.4 mg total) under the tongue every 5 (five) minutes as needed for chest pain. 25 tablet 1    Pitavastatin Calcium 2 MG TABS Take 1 tablet (2 mg total) by mouth daily. 90 tablet 3    predniSONE (DELTASONE) 10 MG tablet Take 1 tablet (10 mg total) by mouth daily with breakfast. 90 tablet 3    ramipril (ALTACE) 5 MG capsule Take 1 capsule (5 mg total) by mouth daily. 90 capsule 3    ranolazine (RANEXA) 500 MG 12 hr tablet Take 1 tablet (500 mg total) by mouth 2 (two) times daily. 60 tablet 2     Family History  Problem Relation Age of Onset   Heart attack Mother    Heart disease Mother    Heart attack Father    Stroke Brother    Diabetes Sister    Stroke Sister    Esophageal cancer Brother    Deep vein thrombosis Brother      Review of Systems:   Review of Systems  Constitutional: Negative.   HENT: Negative.    Eyes: Negative.        Recent bilat cataract surgery  Respiratory:  Positive for shortness of breath.   Cardiovascular:  Positive for chest pain.  Gastrointestinal:  Positive for  constipation, diarrhea, heartburn and melena.  Genitourinary:  Positive for frequency.       + _ nocturia, frequent kidney stones with multiple procedures, H/O BPH w/ urolift giving improvement in frequency and nocturia  Skin:        "Early skin cancer"  Neurological:        Myasthenia gravis with frequent mild sx including neuropathy, weakness, some occas visual sx  Endo/Heme/Allergies:  Bruises/bleeds easily.  Psychiatric/Behavioral:  The patient has insomnia.           Physical Exam: BP 115/63   Pulse 68   Temp 97.9 F (36.6 C) (Oral)   Resp 18   Ht '5\' 6"'$  (1.676 m)   Wt 79.5 kg   SpO2 96%   BMI 28.29 kg/m    General appearance: alert, cooperative, and no distress Head: Normocephalic, without obvious abnormality, atraumatic Neck: no adenopathy, no carotid bruit, no JVD, supple, symmetrical, trachea midline, and thyroid not enlarged, symmetric, no tenderness/mass/nodules Lymph nodes: Cervical, supraclavicular, and axillary nodes normal. Resp: clear to auscultation bilaterally Back: symmetric, no curvature. ROM normal. No CVA tenderness. Cardio: regular rate and rhythm, S1, S2 normal, no murmur, click, rub or gallop GI: Minor diffuse tenderness to palpation throughout, no obvious masses or organomegaly Extremities: No  edema, weak DP pulses bilaterally, I could not feel PT pulses Neurologic: Grossly normal  Diagnostic Studies & Laboratory data:     Recent Radiology Findings:   CARDIAC CATHETERIZATION  Result Date: 04/16/2022   Prox LAD to Mid LAD lesion is 75% stenosed.   Ost LAD to Prox LAD lesion is 75% stenosed.   Prox Cx to Mid Cx lesion is 50% stenosed.   Ost RCA to Prox RCA lesion is 70% stenosed.   Dist RCA lesion is 80% stenosed with 80% stenosed side branch in Ost RPDA.   Ost 1st Diag lesion is 100% stenosed.   Ost 2nd Diag to 2nd Diag lesion is 100% stenosed.   Ost 1st Mrg lesion is 70% stenosed.   1st Mrg lesion is 99% stenosed.   Ost Cx to Prox Cx lesion is 70%  stenosed.   Mid RCA stent is patent.   The left ventricular systolic function is normal.   LV end diastolic pressure is normal.   The left ventricular ejection fraction is 55-65% by visual estimate.   There is no aortic valve stenosis. Severe calcific three-vessel coronary artery disease.  The change in anatomy from 2020 is the 99% lesion in the OM.  There is proximal disease in the OM at the bifurcation with the circumflex.  Will get a surgical evaluation for CABG.  His symptom burden has been increasing of late so we will restart heparin 2 hours after the TR band has been removed.     I have independently reviewed the above radiologic studies and discussed with the patient   Recent Lab Findings: Lab Results  Component Value Date   WBC 7.9 04/16/2022   HGB 10.3 (L) 04/16/2022   HCT 31.3 (L) 04/16/2022   PLT 257 04/16/2022   GLUCOSE 94 04/16/2022   CHOL 216 (H) 11/10/2021   TRIG 202 (H) 11/10/2021   HDL 58 11/10/2021   LDLCALC 123 (H) 11/10/2021   ALT 10 10/16/2019   AST 18 10/16/2019   NA 142 04/16/2022   K 4.0 04/16/2022   CL 108 04/16/2022   CREATININE 1.07 04/16/2022   BUN 17 04/16/2022   CO2 22 04/16/2022   TSH 2.32 11/25/2021   INR 1.1 07/21/2018   HGBA1C 6.3 11/25/2021      Assessment / Plan: Severe multivessel coronary artery disease with progressive unstable angina.  Currently on heparin drip.  Symptoms have been for several months but worsened over the past few days. Previous STEMI with stent placement Myasthenia gravis Reflux esophagitis and dysphagia History of kidney stones status post multiple procedures including lithotripsy and open BPH status post UroLift Previous neck and back surgery  Plan: Patient and studies will be reviewed by the surgeon to determine feasibility and timing of potential CABG.   I  spent 30 minutes counseling the patient face to face.  John Giovanni, PA-C  04/16/2022 12:08 PM  Pt Seen Recommended to have CAB a couple years ago around  time RCA stented, patient deferred.   Comorbidities include myasthenia (bulbar only), MSK surgeries, prior stemi and pci.   Risks/benefits/alternatives of CAB in setting of quite advanced CAD (may need endarterectomy) with myasthenia were discussed at length (80% straightforward recovery, 15% morbidity [any organ, predominantly bleeding, infection, stent thrombosis, graft thrombosis, myasthenic crisis,  5% mortality]. Straightforward recovery typically entails 1-2 ICU days, 3-4 days on the floor, seeing me back in clinic at 1 week and 1 month postoperatively. No lifting greater than 30 lbs for 6 weeks. Total  recovery expected by 2 months. All questions were asked and answered.   Official neurology consult.  (Copied plan below) - Would recommend avoiding known exacerbating meds noted below  - IVIG contraindicated due to his prior adverse reaction; if he develops exacerbation would recommend PLEX (please reach out to neurology if any concerns post operatively) - Okay to use a typical dose of stress dose steroids such as hydrocortisone 100 mg followed by 50 mg q8hr (equivalent to prednisone 25 mg followed by 12.5 mg q8hr); would avoid excessively high doses of steroids equivalent to >100 mg prednisone daily due to risk of causing exacerbation with high-dose steroids - Trend NIF and FVC every 4 hours after extubation, with low threshold to intubate if no if it is rapidly declining or if negative is less than -20 or vital capacity is less than 10 mL/kG - Baseline NIF / FVC ordered for 9 AM on 2/25 as these numbers can vary from institution to institution at times - Inpatient neurology will be available as needed, if patient is having worsening myasthenia symptoms please do reach out, noting that hisearliest symptoms tend to be dysarthria and dysphagia

## 2022-04-16 NOTE — Interval H&P Note (Signed)
Cath Lab Visit (complete for each Cath Lab visit)  Clinical Evaluation Leading to the Procedure:   ACS: Yes.    Non-ACS:    Anginal Classification: CCS IV  Anti-ischemic medical therapy: Minimal Therapy (1 class of medications)  Non-Invasive Test Results: No non-invasive testing performed  Prior CABG: No previous CABG      History and Physical Interval Note:  04/16/2022 10:26 AM  Adrian Bell  has presented today for surgery, with the diagnosis of chest pain.  The various methods of treatment have been discussed with the patient and family. After consideration of risks, benefits and other options for treatment, the patient has consented to  Procedure(s): LEFT HEART CATH AND CORONARY ANGIOGRAPHY (N/A) as a surgical intervention.  The patient's history has been reviewed, patient examined, no change in status, stable for surgery.  I have reviewed the patient's chart and labs.  Questions were answered to the patient's satisfaction.     Adrian Bell

## 2022-04-16 NOTE — Consult Note (Signed)
Neurology Consultation Reason for Consult: Perioperative management of Myasthenia Gravis Requesting Physician: Justice Rocher  CC: chest pain  History is obtained from: Patient and chart review  HPI: Adrian Bell is a 77 y.o. male with PMH of CAD, HLD, HTN, anemia and myasthenia gravis who presented to [facility] for NSTEMI on 04/16/22.  He is anticipating CABG for MVD possibly on Monday.    Per records he was diagnosed in 2018 with MG.  He had onset of double vision, slurred speech and difficulty swallowing and presented to the ED; .  He had stroke workup which was negative.  AChR returned positive.  He was initially placed on mestinon and steroids.  Mestinon was eventually discontinued due to cramps. He had an exacerbation of his symptoms in 3/23 with difficulty in speech and swallowing with dose attenuation up on Prednisone.  He was also placed on IVIG monthly and eventually decreased to every 3 weeks.  Prednisone was then tapered again.  IVIG was then d/c'd in 11/23 due to severe reaction during infusion.  He now is maintained on Prednisone 10 mg daily with no further exacerbations.  He has never been intubated or hospitalized for an exacerbation and has never had proximal muscle weakness.  In fact there is a plan to taper even his prednisone if he continues to do well this summer.  Today, he denies any episodes of limb weakness.  He denies double vision.  He is not having any difficulty with swallowing or speaking.  ROS: All other review of systems was negative except as noted in the HPI.   Past Medical History:  Diagnosis Date   Dysphagia    Heart attack ([place]) 2020   Hyperlipidemia    Kidney stones    Myasthenia gravis ([place])    STEMI (ST elevation myocardial infarction) [facility])    Past Surgical History:  Procedure Laterality Date   BACK SURGERY     CARDIAC CATHETERIZATION     COLONOSCOPY  2022   CORONARY/GRAFT ACUTE MI REVASCULARIZATION N/A 07/21/2018   Procedure: Coronary/Graft Acute  MI Revascularization;  Surgeon: [name], MD;  Location: [place] CV LAB;  Service: Cardiovascular;  Laterality: N/A;   CYSTOSCOPY WITH INSERTION OF UROLIFT  2018   LASIK     LEFT HEART CATH AND CORONARY ANGIOGRAPHY N/A 07/21/2018   Procedure: LEFT HEART CATH AND CORONARY ANGIOGRAPHY;  Surgeon: [name], MD;  Location: [place] CV LAB;  Service: Cardiovascular;  Laterality: N/A;   LEFT HEART CATH AND CORONARY ANGIOGRAPHY N/A 12/08/2018   Procedure: LEFT HEART CATH AND CORONARY ANGIOGRAPHY;  Surgeon: Martinique, Peter M, MD;  Location: [place] CV LAB;  Service: Cardiovascular;  Laterality: N/A;   LEFT HEART CATH AND CORONARY ANGIOGRAPHY N/A 04/16/2022   Procedure: LEFT HEART CATH AND CORONARY ANGIOGRAPHY;  Surgeon: [name], MD;  Location: [place] CV LAB;  Service: Cardiovascular;  Laterality: N/A;   LITHOTRIPSY     NECK SURGERY     TONSILLECTOMY     Family History  Problem Relation Age of Onset   Heart attack Mother    Heart disease Mother    Heart attack Father    Stroke Brother    Diabetes Sister    Stroke Sister    Esophageal cancer Brother    Deep vein thrombosis Brother    Past Surgical History:  Procedure Laterality Date   BACK SURGERY     CARDIAC CATHETERIZATION     COLONOSCOPY  2022   CORONARY/GRAFT ACUTE  MI REVASCULARIZATION N/A 07/21/2018   Procedure: Coronary/Graft Acute MI Revascularization;  Surgeon: [name], MD;  Location: [place] CV LAB;  Service: Cardiovascular;  Laterality: N/A;   CYSTOSCOPY WITH INSERTION OF UROLIFT  2018   LASIK     LEFT HEART CATH AND CORONARY ANGIOGRAPHY N/A 07/21/2018   Procedure: LEFT HEART CATH AND CORONARY ANGIOGRAPHY;  Surgeon: [name], MD;  Location: [place] [facility] LAB;  Service: Cardiovascular;  Laterality: N/A;   LEFT HEART CATH AND CORONARY ANGIOGRAPHY N/A 12/08/2018   Procedure: LEFT HEART CATH AND CORONARY ANGIOGRAPHY;  Surgeon: Martinique, Peter M, MD;   Location: [place] CV LAB;  Service: Cardiovascular;  Laterality: N/A;   LEFT HEART CATH AND CORONARY ANGIOGRAPHY N/A 04/16/2022   Procedure: LEFT HEART CATH AND CORONARY ANGIOGRAPHY;  Surgeon: [name], MD;  Location: [place] CV LAB;  Service: Cardiovascular;  Laterality: N/A;   LITHOTRIPSY     NECK SURGERY     TONSILLECTOMY       Social History:  reports that he has never smoked. He quit smokeless tobacco use about 15 years ago.  His smokeless tobacco use included chew. He reports current alcohol use. He reports that he does not use drugs.  Exam: Current vital signs: BP 126/69 (BP Location: Left Arm)   Pulse 96   Temp 98 F (36.7 C) (Oral)   Resp (!) 22   Ht '5\' 6"'$  (1.676 m)   Wt 79.5 kg   SpO2 98%   BMI 28.29 kg/m  Vital signs in last 24 hours: Temp:  [97.7 F (36.5 C)-98 F (36.7 C)] 98 F (36.7 C) (02/23 1528) Pulse Rate:  [0-96] 96 (02/23 1700) Resp:  [14-22] 22 (02/23 1700) BP: (105-171)/(63-98) 126/69 (02/23 1528) SpO2:  [92 %-99 %] 98 % (02/23 1700) Weight:  [79.5 kg] 79.5 kg (02/23 0300)   Physical Exam  Constitutional: Appears well-developed and well-nourished.  Psych: Affect appropriate to situation HENT: No oropharyngeal obstruction.  MSK: no joint deformities.  Cardiovascular: Normal rate and regular rhythm.  Perfusing extremities well Respiratory: Effort normal, non-labored breathing GI: Soft.  No distension.  Skin: Warm dry and intact visible skin  Neuro: Mental Status: Patient is awake, alert, oriented to person, place, month, year, and situation. Patient is able to give a clear and coherent history. No signs of aphasia or neglect. No dysarthria. Cranial Nerves: II: Visual Fields are full. Pupils are equal, round, and reactive to light.   III,IV, VI: EOMI without ptosis or diploplia.  V: Facial sensation is symmetric to temperature VII: Facial movement is symmetric.  VIII: hearing is intact to voice X: Uvula elevates  symmetrically XI: Shoulder shrug is symmetric. XII: tongue is midline without atrophy or fasciculations.  Motor: Tone is normal. Bulk is normal. 5/5 strength was present in all four extremities.  Sensory: Sensation is symmetric to light touch and temperature in the arms and legs. Gait:  Intact and steady  MG specific testing: Prolonged upgaze normal without development of diplopia Exhale breath count was excellent counting up to #40 without taking a breath. I had the patient lift his arms to 90 degrees and abduct and adduct repeatedly greater than 15 times to fatigue the muscle.   No weakness was noted in shoulder abduction or adduction on either side.   I have reviewed labs in epic and the results pertinent to this consultation are: [facility] labs from 04/20/17 Labs 04/20/2017: AChR binding 9.20*, blocking 76*, and modulating 90*   Imaging: 05/03/17-  CT chest- without thymoma    Assessment: 77 yo male with MVD in need of CABG with history of myasthenia gravis, neurology is asked to weigh in on perioperative management for this urgently needed major procedure. Perioperative IVIG or PLEX typically reserved for cases of with significant bulbar dysfunction, given his lack of symptoms at this time he does not need to start any treatment at this time.   Regarding risk stratification, factors associated with increased need for ventilation / MG crisis include:  COPD  BMI > 28  Disease duration > 2 years  Bulbar / respiratory symptoms  Generalized moderate or severe weakness  History of prior MG Crises  Pyridostigmine doses > 750 mg/day  Vital capacity < 2 - 2.9   AChR antibody concentrations > 100 nmol/mL  RNS decrement >18-20%  Intraoperative blood loss > 1000 mL  Lung resection  Of these, patient has elevated BMI, disease duration > 2 years, history of bulbar symptoms -- given he is well optimized from myasthenia symptom perspective no need to delay surgery from an MG perspective.    Impression: Thanks to excellent outpatient care by Dr. Narda Amber patient is well optimized from a myasthenia gravis perspective and there is no barrier to surgery  Recommendations: - Would recommend avoiding known exacerbating meds noted below  - IVIG contraindicated due to his prior adverse reaction; if he develops exacerbation would recommend PLEX (please reach out to neurology if any concerns post operatively) - Okay to use a typical dose of stress dose steroids such as hydrocortisone 100 mg followed by 50 mg q8hr (equivalent to prednisone 25 mg followed by 12.5 mg q8hr); would avoid excessively high doses of steroids equivalent to >100 mg prednisone daily due to risk of causing exacerbation with high-dose steroids - Trend NIF and FVC every 4 hours after extubation, with low threshold to intubate if no if it is rapidly declining or if negative is less than -20 or vital capacity is less than 10 mL/kG - Baseline NIF / FVC ordered for 9 AM on 2/25 as these numbers can vary from institution to institution at times - Inpatient neurology will be available as needed, if patient is having worsening myasthenia symptoms please do reach out, noting that his earliest symptoms tend to be dysarthria and dysphagia   Many drugs have been reported to have adverse effects in patients with MG (see below). However, not all patients react adversely to all these drugs. Conversely, not all "safe" drugs can be used with impunity in patients with MG.As a rule, the listed drugs should be avoided whenever possible, and patients with MG should be followed closely when any new drug is introduced.  Drugs that may exacerbate MG  Antibiotics  Aminoglycosides: e.g., streptomycin, tobramycin, kanamycin  Quinolones: e.g., ciprofloxacin, levofloxacin, ofloxacin, gatifloxacin  Macrolides: e.g., erythromycin, azithromycin, telithromycin  Nondepolarizing muscle relaxants for surgery  d-Tubocurarine (curare), pancuronium,  vecuronium, atracurium  Beta-blocking agents  Propranolol, atenolol, metoprolol  Local anesthetics and related agents  Procaine, Xylocaine in large amounts  Procainamide (for arrhythmias)  Botulinum toxin  Botox exacerbates weakness.  Quinine derivatives  Quinine, quinidine, chloroquine, mefloquine (Lariam)  Magnesium  Decreases ACh release  Penicillamine  May cause MG  Drugs with important interactions in MG  Cyclosporine  Broad range of drug interactions, which may raise or lower cyclosporine levels  Azathioprine  Avoid allopurinol; combination may result in myelosuppression.    Patient seen with the assistance of NP Lorenso Courier  Attending Neurologist's note:  I personally  saw this patient, gathering history, performing a full neurologic examination, reviewing relevant labs, and formulated the assessment and plan, adding the note above for completeness and clarity to accurately reflect my thoughts  Lesleigh Noe MD-PhD [facility] [phone] Available 7 AM to 7 PM, outside these hours please contact Neurologist on call listed on AMION

## 2022-04-16 NOTE — H&P (View-Only) (Signed)
Patient seen and examined. History reviewed.  Patient is currently pain free. States he has been having accelerating angina in the past 2 weeks. Presented to [facility] hospital yesterday. Cardiac enzymes were negative. Ecg shows a chronic RBBB. No acute ST-T changes. On IV heparin.  Patient is s/p stent of mid RCA in May 2020. Presented again in Oct 2020 with chest pain. Stent was patent but he did have moderate diffuse CAD. He was managed medically and has done well on isosorbide, Toprol and Ranexa. Stays active. Recent Echo showed normal LV function  Agree with assessment/plan per Dr Margaretha Sheffield this am  Plan cardiac cath today. Decision concerning PCI versus CABG depending on findings. The procedure and risks were reviewed including but not limited to death, myocardial infarction, stroke, arrythmias, bleeding, transfusion, emergency surgery, dye allergy, or renal dysfunction. The patient voices understanding and is agreeable to proceed.   Adrian Sparkman Martinique MD, [facility]

## 2022-04-16 NOTE — Progress Notes (Addendum)
ANTICOAGULATION CONSULT NOTE - Initial Consult  Pharmacy Consult for Heparin Indication: chest pain/ACS  Allergies  Allergen Reactions   Zetia [Ezetimibe] Other (See Comments)    Weakness, shortness of breath   Demerol [Meperidine Hcl]    Statins Other (See Comments)    myalgias    Patient Measurements: Height: '5\' 6"'$  (167.6 cm) Weight: 79.5 kg (175 lb 4.3 oz) IBW/kg (Calculated) : 63.8 Heparin Dosing Weight: 75 kg  Vital Signs: Temp: 97.7 F (36.5 C) (02/22 2326) Temp Source: Oral (02/22 2326) BP: 128/73 (02/22 2326) Pulse Rate: 89 (02/22 2207)  Labs: No results for input(s): "HGB", "HCT", "PLT", "APTT", "LABPROT", "INR", "HEPARINUNFRC", "HEPRLOWMOCWT", "CREATININE", "CKTOTAL", "CKMB", "TROPONINIHS" in the last 72 hours.  CrCl cannot be calculated (Patient's most recent lab result is older than the maximum 21 days allowed.).   Medical History: Past Medical History:  Diagnosis Date   Dysphagia    Heart attack ([place]) 2020   Hyperlipidemia    Kidney stones    Myasthenia gravis ([place])    STEMI (ST elevation myocardial infarction) ([place])     Medications:  Medications Prior to Admission  Medication Sig Dispense Refill Last Dose   alum & mag hydroxide-simeth (MINTOX) 200-200-20 MG/5ML suspension Take 30 mLs by mouth daily.      aspirin 81 MG chewable tablet Chew 1 tablet (81 mg total) by mouth daily. 90 tablet 1    Calcium Carbonate-Vit D-Min (CALCIUM 1200 PO) Take by mouth.      Cyanocobalamin (VITAMIN B-12 PO) Take 1 tablet by mouth daily.      ferrous gluconate (FERGON) 324 MG tablet Take 1 tablet by mouth 3 (three) times a week. Monday, Wednesday AND Friday      isosorbide mononitrate (IMDUR) 60 MG 24 hr tablet Take 1 tablet (60 mg total) by mouth daily. 90 tablet 3    metoprolol succinate (TOPROL-XL) 25 MG 24 hr tablet Take 0.5 tablets (12.5 mg total) by mouth daily. (Patient taking differently: Take 12.5 mg by mouth daily. 0.5 (12.5 mg ) daily) 90 tablet 1     nitroGLYCERIN (NITROSTAT) 0.4 MG SL tablet Place 1 tablet (0.4 mg total) under the tongue every 5 (five) minutes as needed for chest pain. 25 tablet 1    Pitavastatin Calcium 2 MG TABS Take 1 tablet (2 mg total) by mouth daily. 90 tablet 3    predniSONE (DELTASONE) 10 MG tablet Take 1 tablet (10 mg total) by mouth daily with breakfast. 90 tablet 3    ramipril (ALTACE) 5 MG capsule Take 1 capsule (5 mg total) by mouth daily. 90 capsule 3    ranolazine (RANEXA) 500 MG 12 hr tablet Take 1 tablet (500 mg total) by mouth 2 (two) times daily. 60 tablet 2     Assessment: 77 y.o. male with chest pain for heparin.  Admitted to [facility] 2/21 and started heparin at that time.  Chest CT negative for PE.  Currently heparin infusing at 1000 units/hr Goal of Therapy:  Heparin level 0.3-0.7 units/ml Monitor platelets by anticoagulation protocol: Yes   Plan:  Continue Heparin 1000 units/hr Follow-up am labs.   Caryl Pina 04/16/2022,12:21 AM  Addendum: Heparin level 0.61--within goal range  No change to heparin  Phillis Knack, PharmD, BCPS 04/16/2022 1:31 AM

## 2022-04-16 NOTE — Progress Notes (Signed)
ANTICOAGULATION CONSULT NOTE  Pharmacy Consult for Heparin Indication: chest pain/ACS  Allergies  Allergen Reactions   Zetia [Ezetimibe] Other (See Comments)    Weakness, shortness of breath   Demerol [Meperidine Hcl]    Statins Other (See Comments)    myalgias    Patient Measurements: Height: '5\' 6"'$  (167.6 cm) Weight: 79.5 kg (175 lb 4.8 oz) IBW/kg (Calculated) : 63.8 Heparin Dosing Weight: 75 kg  Vital Signs: Temp: 97.9 F (36.6 C) (02/23 1115) Temp Source: Oral (02/23 1115) BP: 105/65 (02/23 1215) Pulse Rate: 69 (02/23 1215)  Labs: Recent Labs    04/16/22 0039 04/16/22 0655  HGB 10.3*  --   HCT 31.3*  --   PLT 257  --   HEPARINUNFRC 0.61  --   CREATININE  --  1.07    Estimated Creatinine Clearance: 58.2 mL/min (by C-G formula based on SCr of 1.07 mg/dL).   Medical History: Past Medical History:  Diagnosis Date   Dysphagia    Heart attack ([place]) 2020   Hyperlipidemia    Kidney stones    Myasthenia gravis ([place])    STEMI (ST elevation myocardial infarction) ([place])     Medications:  Medications Prior to Admission  Medication Sig Dispense Refill Last Dose   alum & mag hydroxide-simeth (MINTOX) 200-200-20 MG/5ML suspension Take 30 mLs by mouth daily.      aspirin 81 MG chewable tablet Chew 1 tablet (81 mg total) by mouth daily. 90 tablet 1    Calcium Carbonate-Vit D-Min (CALCIUM 1200 PO) Take by mouth.      Cyanocobalamin (VITAMIN B-12 PO) Take 1 tablet by mouth daily.      ferrous gluconate (FERGON) 324 MG tablet Take 1 tablet by mouth 3 (three) times a week. Monday, Wednesday AND Friday      isosorbide mononitrate (IMDUR) 60 MG 24 hr tablet Take 1 tablet (60 mg total) by mouth daily. 90 tablet 3    metoprolol succinate (TOPROL-XL) 25 MG 24 hr tablet Take 0.5 tablets (12.5 mg total) by mouth daily. (Patient taking differently: Take 12.5 mg by mouth daily. 0.5 (12.5 mg ) daily) 90 tablet 1    nitroGLYCERIN (NITROSTAT) 0.4 MG SL tablet Place 1 tablet (0.4 mg  total) under the tongue every 5 (five) minutes as needed for chest pain. 25 tablet 1    Pitavastatin Calcium 2 MG TABS Take 1 tablet (2 mg total) by mouth daily. 90 tablet 3    predniSONE (DELTASONE) 10 MG tablet Take 1 tablet (10 mg total) by mouth daily with breakfast. 90 tablet 3    ramipril (ALTACE) 5 MG capsule Take 1 capsule (5 mg total) by mouth daily. 90 capsule 3    ranolazine (RANEXA) 500 MG 12 hr tablet Take 1 tablet (500 mg total) by mouth 2 (two) times daily. 60 tablet 2     Assessment: 77 y.o. male with chest pain for heparin.  Admitted to [facility] 2/21 and started heparin at that time.  Chest CT negative for PE.    Cath 2/23 finding severe multivessel CAD - plan for CTVS eval. Plan to start heparin 2 hr after TR band removed (documented on 2/23'@1310'$ ). Last level was within goal on higher end on 1000 units/hr. CBC stable, no s/sx of bleeding.  Goal of Therapy:  Heparin level 0.3-0.7 units/ml Monitor platelets by anticoagulation protocol: Yes   Plan:  Restart heparin infusion at 950 units/hr on 2/23 at 1530 Monitor daily HL, CBC, and for s/sx of bleeding   Adrian Bell  Adrian Bell, PharmD, [place] Clinical Pharmacist  Phone: [phone] 04/16/2022 12:22 PM  Please check AMION for all [place] phone numbers After 10:00 PM, call [place] [phone]

## 2022-04-16 NOTE — H&P (Signed)
Cardiology Admission History and Physical:   Patient ID: Adrian Bell MRN: [id number]; DOB: September 14, 1945   Admission date: 04/15/2022  Primary Care Provider: [place] Primary Cardiologist: Shirlee More, MD Primary Electrophysiologist:  None   Chief Complaint:  Chest Pain   Patient Profile:   Adrian Bell is a 77 y.o. male with severe heavily calcified multi-vessel CAD, HLD, HTN, Anemia, and myasthenia gravis presents to [facility] for NSTEMI.   History of Present Illness:   Adrian Bell presented to [facility] Emergency Department with left sided chest pain. States the pain was severe and it had be intermittently occurring over the last several months. States that he took 3 SL NTG with alleviation of his pain. Workup at OSH demonstrated negative troponin, EKG with sinus bradycardia HR 57 bpm, low voltage QRS. CTA was negative for dissection. He was started on heparin, given ASA 325 mg, and transferred for unstable angina.    Of note, Adrian Bell had a telephone appointment with Dr. Bettina Gavia on 03/22/2022 with the chief complaint of shortness of breath that was concerning for heart failure symptoms. ECHO was obtained with normal LV and RV function, grade I diastolic dysfunction, without significant valvular disease. BNP was obtained and was 71. His imdur was increase from '30mg'$  to 60 mg.   Past Medical History:  Diagnosis Date   Dysphagia    Heart attack ([place]) 2020   Hyperlipidemia    Kidney stones    Myasthenia gravis ([place])    STEMI (ST elevation myocardial infarction) [facility])     Past Surgical History:  Procedure Laterality Date   BACK SURGERY     CARDIAC CATHETERIZATION     COLONOSCOPY  2022   CORONARY/GRAFT ACUTE MI REVASCULARIZATION N/A 07/21/2018   Procedure: Coronary/Graft Acute MI Revascularization;  Surgeon: [name], MD;  Location: [place] CV LAB;  Service: Cardiovascular;  Laterality: N/A;   CYSTOSCOPY WITH INSERTION OF UROLIFT  2018   LASIK     LEFT  HEART CATH AND CORONARY ANGIOGRAPHY N/A 07/21/2018   Procedure: LEFT HEART CATH AND CORONARY ANGIOGRAPHY;  Surgeon: [name], MD;  Location: [place] CV LAB;  Service: Cardiovascular;  Laterality: N/A;   LEFT HEART CATH AND CORONARY ANGIOGRAPHY N/A 12/08/2018   Procedure: LEFT HEART CATH AND CORONARY ANGIOGRAPHY;  Surgeon: Martinique, Peter M, MD;  Location: [place] CV LAB;  Service: Cardiovascular;  Laterality: N/A;   LITHOTRIPSY     NECK SURGERY     TONSILLECTOMY       Medications Prior to Admission: Prior to Admission medications   Medication Sig Start Date End Date Taking? Authorizing Provider  alum & mag hydroxide-simeth (MINTOX) 200-200-20 MG/5ML suspension Take 30 mLs by mouth daily.    [provider]  aspirin 81 MG chewable tablet Chew 1 tablet (81 mg total) by mouth daily. 07/24/18   Cheryln Manly, NP  Calcium Carbonate-Vit D-Min (CALCIUM 1200 PO) Take by mouth.    [provider]  Cyanocobalamin (VITAMIN B-12 PO) Take 1 tablet by mouth daily.    [provider]  ferrous gluconate (FERGON) 324 MG tablet Take 1 tablet by mouth 3 (three) times a week. Monday, Wednesday AND Friday 07/22/20   [provider]  isosorbide mononitrate (IMDUR) 60 MG 24 hr tablet Take 1 tablet (60 mg total) by mouth daily. 03/22/22 03/17/23  Richardo Priest, MD  metoprolol succinate (TOPROL-XL) 25 MG 24 hr tablet Take 0.5 tablets (12.5 mg total) by mouth daily. Patient taking differently:  Take 12.5 mg by mouth daily. 0.5 (12.5 mg ) daily 08/23/18   Richardo Priest, MD  nitroGLYCERIN (NITROSTAT) 0.4 MG SL tablet Place 1 tablet (0.4 mg total) under the tongue every 5 (five) minutes as needed for chest pain. 11/10/21   Richardo Priest, MD  Pitavastatin Calcium 2 MG TABS Take 1 tablet (2 mg total) by mouth daily. 05/01/20   Richardo Priest, MD  predniSONE (DELTASONE) 10 MG tablet Take 1 tablet (10 mg total) by mouth daily with breakfast. 12/18/21   Narda Amber K, DO   ramipril (ALTACE) 5 MG capsule Take 1 capsule (5 mg total) by mouth daily. 12/04/21   Richardo Priest, MD  ranolazine (RANEXA) 500 MG 12 hr tablet Take 1 tablet (500 mg total) by mouth 2 (two) times daily. 03/23/19   Richardo Priest, MD     Allergies:    Allergies  Allergen Reactions   Zetia [Ezetimibe] Other (See Comments)    Weakness, shortness of breath   Demerol [Meperidine Hcl]    Statins Other (See Comments)    myalgias    Social History:   Social History   Socioeconomic History   Marital status: Married    Spouse name: Not on file   Number of children: 3   Years of education: 14   Highest education level: Not on file  Occupational History   Occupation: retired    Fish farm manager: [employer]  Tobacco Use   Smoking status: Never   Smokeless tobacco: Former    Types: Chew    Quit date: 2009  Vaping Use   Vaping Use: Never used  Substance and Sexual Activity   Alcohol use: Yes    Comment: occ    Drug use: No   Sexual activity: Not on file  Other Topics Concern   Not on file  Social History Narrative   Lives with wife in a one story home.  Has 3 children.     Retired Financial risk analyst.     Education: some college.    Right handed   Drinks caffeine    Social Determinants of Health   Financial Resource Strain: Not on file  Food Insecurity: Not on file  Transportation Needs: Not on file  Physical Activity: Not on file  Stress: Not on file  Social Connections: Not on file  Intimate Partner Violence: Not on file    Family History:   The patient's family history includes Deep vein thrombosis in his brother; Diabetes in his sister; Esophageal cancer in his brother; Heart attack in his father and mother; Heart disease in his mother; Stroke in his brother and sister.    Review of Systems: [y] = yes, '[ ]'$  = no    General: Weight gain '[ ]'$ ; Weight loss '[ ]'$ ; Anorexia '[ ]'$ ; Fatigue [ x]; Fever '[ ]'$ ; Chills '[ ]'$ ; Weakness '[ ]'$   Cardiac: Chest pain/pressure  [x ]; Resting SOB '[ ]'$ ; Exertional SOB [ x]; Orthopnea '[ ]'$ ; Pedal Edema '[ ]'$ ; Palpitations '[ ]'$ ; Syncope '[ ]'$ ; Presyncope '[ ]'$ ; Paroxysmal nocturnal dyspnea'[ ]'$   Pulmonary: Cough '[ ]'$ ; Wheezing'[ ]'$ ; Hemoptysis'[ ]'$ ; Sputum '[ ]'$ ; Snoring '[ ]'$   GI: Vomiting'[ ]'$ ; Dysphagia'[ ]'$ ; Melena'[ ]'$ ; Hematochezia '[ ]'$ ; Heartburn'[ ]'$ ; Abdominal pain '[ ]'$ ; Constipation '[ ]'$ ; Diarrhea '[ ]'$ ; BRBPR '[ ]'$   GU: Hematuria'[ ]'$ ; Dysuria '[ ]'$ ; Nocturia'[ ]'$   Vascular: Pain in legs with walking '[ ]'$ ; Pain in feet with lying flat '[ ]'$ ; Non-healing sores '[ ]'$ ;  Stroke '[ ]'$ ; TIA '[ ]'$ ; Slurred speech '[ ]'$ ;  Neuro: Headaches'[ ]'$ ; Vertigo'[ ]'$ ; Seizures'[ ]'$ ; Paresthesias'[ ]'$ ;Blurred vision '[ ]'$ ; Diplopia '[ ]'$ ; Vision changes '[ ]'$   Ortho/Skin: Arthritis '[ ]'$ ; Joint pain '[ ]'$ ; Muscle pain '[ ]'$ ; Joint swelling '[ ]'$ ; Back Pain '[ ]'$ ; Rash '[ ]'$   Psych: Depression'[ ]'$ ; Anxiety'[ ]'$   Heme: Bleeding problems '[ ]'$ ; Clotting disorders '[ ]'$ ; Anemia '[ ]'$   Endocrine: Diabetes '[ ]'$ ; Thyroid dysfunction'[ ]'$   Physical Exam/Data:   Vitals:   04/15/22 2207 04/15/22 2326 04/16/22 0300  BP: (!) 148/87 128/73 (!) 171/98  Pulse: 89  83  Resp: (!) 22  20  Temp: 97.9 F (36.6 C) 97.7 F (36.5 C) 97.9 F (36.6 C)  TempSrc: Oral Oral Oral  SpO2: 95%  96%  Weight: 79.5 kg  79.5 kg  Height: '5\' 6"'$  (1.676 m)      Intake/Output Summary (Last 24 hours) at 04/16/2022 0407 Last data filed at 04/16/2022 0340 Gross per 24 hour  Intake 30.24 ml  Output 250 ml  Net -219.76 ml   Filed Weights   04/15/22 2207 04/16/22 0300  Weight: 79.5 kg 79.5 kg   Body mass index is 28.29 kg/m.  General:  Well nourished, well developed, in no acute distress, laying flat on bed.  HEENT: normal Lymph: no adenopathy Neck: no JVD Endocrine:  No thryomegaly Vascular: 2+ radial pulse  Cardiac:  normal S1, S2; RRR, no murmur  Lungs:  clear to auscultation bilaterally, no wheezing, rhonchi or rales  Abd: soft, nontender, no hepatomegaly  Ext: no edema Musculoskeletal:  No deformities, BUE and BLE strength normal and  equal Skin: warm and dry     EKG:  Admission EKG pending   Relevant CV Studies: 11/2018 Coronary Angiogram   Complex 3 vessel CAD. He has severely calcified coronary arteries.    - 70% ostial LAD with bulky, eccentric lesion    - segmental 75% mid LAD    - occluded small first and second diagonals    - 50% ostial and mid LCx    - 70% ostial large OM1    - 70% ostial RCA. 80% distal RCA at bifurcation  02/2022 ECHO   1. GLS -13.4. Left ventricular ejection fraction, by estimation, is 60 to  65%. The left ventricle has normal function. The left ventricle has no  regional wall motion abnormalities. Left ventricular diastolic parameters  are consistent with Grade I  diastolic dysfunction (impaired relaxation).   2. Right ventricular systolic function is normal. The right ventricular  size is normal.   3. The mitral valve is normal in structure. No evidence of mitral valve  regurgitation. No evidence of mitral stenosis.   4. The aortic valve is calcified. Aortic valve regurgitation is not  visualized. No aortic stenosis is present.   5. The inferior vena cava is normal in size with greater than 50%  respiratory variability, suggesting right atrial pressure of 3 mmHg.   Laboratory Data:  Chemistry pending  Hematology Recent Labs  Lab 04/16/22 0039  WBC 7.9  RBC 3.27*  HGB 10.3*  HCT 31.3*  MCV 95.7  MCH 31.5  MCHC 32.9  RDW 13.8  PLT 257    Radiology/Studies:  No results found.  Assessment and Plan:   Unstable Angina  Adrian Bell has complex multi-vessel disease that appears to be severely calcified and difficult to stent. He has declined CABG in the past. Will need an ischemia evaluation this admission - Continue heparin  gtt, pharmacy to dose  - Given ASA 325 mg, daily ASA 81 mg  - Hold on P2Y12 - Continue home ranolazine 500 mg Q12H, imdur 60 mg, metoprolol succinate 12.5 mg  - Takes pitavastatin '2mg'$  twice weekly, he has declined taking a statin while inpatient  given his history of myalgias and concern statins can potentate with myasthenia gravis excerbation  2.   Normocytic Anemia  He has a history of iron deficiency. Will obtain iron labs, however MCV of 95 is more concerning for macrocytic causes.  - Obtain Iron labs, B12 and folate   3.    Myasthenia Gravis History of bulbar weakness.  - Prednisone 10 mg daily   4.   Hypertension with Hypertnesive Heart Disease - Continue agents above. If continues to be elevated, can add another agent while inpatient   Severity of Illness: The appropriate patient status for this patient is INPATIENT. Inpatient status is judged to be reasonable and necessary in order to provide the required intensity of service to ensure the patient's safety. The patient's presenting symptoms, physical exam findings, and initial radiographic and laboratory data in the context of their chronic comorbidities is felt to place them at high risk for further clinical deterioration. Furthermore, it is not anticipated that the patient will be medically stable for discharge from the hospital within 2 midnights of admission.   * I certify that at the point of admission it is my clinical judgment that the patient will require inpatient hospital care spanning beyond 2 midnights from the point of admission due to high intensity of service, high risk for further deterioration and high frequency of surveillance required.*   For questions or updates, please contact [place] Please consult [email or web address] for contact info under        Signed, Clent Demark, MD  04/16/2022 4:07 AM

## 2022-04-16 NOTE — Progress Notes (Signed)
Patient seen and examined. History reviewed.  Patient is currently pain free. States he has been having accelerating angina in the past 2 weeks. Presented to [facility] hospital yesterday. Cardiac enzymes were negative. Ecg shows a chronic RBBB. No acute ST-T changes. On IV heparin.  Patient is s/p stent of mid RCA in May 2020. Presented again in Oct 2020 with chest pain. Stent was patent but he did have moderate diffuse CAD. He was managed medically and has done well on isosorbide, Toprol and Ranexa. Stays active. Recent Echo showed normal LV function  Agree with assessment/plan per Dr Margaretha Sheffield this am  Plan cardiac cath today. Decision concerning PCI versus CABG depending on findings. The procedure and risks were reviewed including but not limited to death, myocardial infarction, stroke, arrythmias, bleeding, transfusion, emergency surgery, dye allergy, or renal dysfunction. The patient voices understanding and is agreeable to proceed.   Mabelle Mungin Martinique MD, [facility]

## 2022-04-17 DIAGNOSIS — I214 Non-ST elevation (NSTEMI) myocardial infarction: Secondary | ICD-10-CM | POA: Diagnosis not present

## 2022-04-17 LAB — CBC
HCT: 31.6 % — ABNORMAL LOW (ref 39.0–52.0)
Hemoglobin: 10 g/dL — ABNORMAL LOW (ref 13.0–17.0)
MCH: 30.8 pg (ref 26.0–34.0)
MCHC: 31.6 g/dL (ref 30.0–36.0)
MCV: 97.2 fL (ref 80.0–100.0)
Platelets: 238 10*3/uL (ref 150–400)
RBC: 3.25 MIL/uL — ABNORMAL LOW (ref 4.22–5.81)
RDW: 13.7 % (ref 11.5–15.5)
WBC: 5.9 10*3/uL (ref 4.0–10.5)
nRBC: 0 % (ref 0.0–0.2)

## 2022-04-17 LAB — HEPARIN LEVEL (UNFRACTIONATED): Heparin Unfractionated: 0.34 IU/mL (ref 0.30–0.70)

## 2022-04-17 NOTE — Progress Notes (Signed)
Rounding Note    Patient Name: Sears Haag Date of Encounter: 04/17/2022  [place] Cardiologist: Shirlee More, MD   Subjective   No chest pain.   Inpatient Medications    Scheduled Meds:  aspirin EC  81 mg Oral Daily   isosorbide mononitrate  60 mg Oral Daily   metoprolol succinate  12.5 mg Oral Daily   pravastatin  40 mg Oral q1800   predniSONE  10 mg Oral Q breakfast   ramipril  5 mg Oral Daily   ranolazine  500 mg Oral BID   sodium chloride flush  3 mL Intravenous Q12H   Continuous Infusions:  sodium chloride     heparin 950 Units/hr (04/16/22 1707)   PRN Meds: sodium chloride, acetaminophen, nitroGLYCERIN, ondansetron (ZOFRAN) IV, sodium chloride flush   Vital Signs    Vitals:   04/17/22 0743 04/17/22 0751 04/17/22 0800 04/17/22 0900  BP: (!) 146/79     Pulse: 86     Resp: (!) 23 (!) 24 18 (!) 21  Temp: 98.1 F (36.7 C)     TempSrc: Oral     SpO2: 98%     Weight:      Height:        Intake/Output Summary (Last 24 hours) at 04/17/2022 1050 Last data filed at 04/17/2022 0743 Gross per 24 hour  Intake 1888.37 ml  Output 875 ml  Net 1013.37 ml      04/16/2022    3:00 AM 04/15/2022   10:07 PM 03/22/2022    8:53 AM  Last 3 Weights  Weight (lbs) 175 lb 4.8 oz 175 lb 4.3 oz 184 lb  Weight (kg) 79.516 kg 79.5 kg 83.462 kg      Telemetry    nsr - Personally Reviewed  ECG    none - Personally Reviewed  Physical Exam   GEN: No acute distress.   Neck: No JVD Cardiac: RRR, no murmurs, rubs, or gallops.  Respiratory: Clear to auscultation bilaterally. GI: Soft, nontender, non-distended  MS: No edema; No deformity. Neuro:  Nonfocal  Psych: Normal affect   Labs    High Sensitivity Troponin:  No results for input(s): "TROPONINIHS" in the last 720 hours.   Chemistry Recent Labs  Lab 04/16/22 0655  NA 142  K 4.0  CL 108  CO2 22  GLUCOSE 94  BUN 17  CREATININE 1.07  CALCIUM 8.7*  GFRNONAA >60  ANIONGAP 12    Lipids No  results for input(s): "CHOL", "TRIG", "HDL", "LABVLDL", "LDLCALC", "CHOLHDL" in the last 168 hours.  Hematology Recent Labs  Lab 04/16/22 0039 04/17/22 0030  WBC 7.9 5.9  RBC 3.27* 3.25*  HGB 10.3* 10.0*  HCT 31.3* 31.6*  MCV 95.7 97.2  MCH 31.5 30.8  MCHC 32.9 31.6  RDW 13.8 13.7  PLT 257 238   Thyroid No results for input(s): "TSH", "FREET4" in the last 168 hours.  BNPNo results for input(s): "BNP", "PROBNP" in the last 168 hours.  DDimer No results for input(s): "DDIMER" in the last 168 hours.   Radiology    CARDIAC CATHETERIZATION  Result Date: 04/16/2022   Prox LAD to Mid LAD lesion is 75% stenosed.   Ost LAD to Prox LAD lesion is 75% stenosed.   Prox Cx to Mid Cx lesion is 50% stenosed.   Ost RCA to Prox RCA lesion is 70% stenosed.   Dist RCA lesion is 80% stenosed with 80% stenosed side branch in Ost RPDA.   Ost 1st Diag lesion is  100% stenosed.   Ost 2nd Diag to 2nd Diag lesion is 100% stenosed.   Ost 1st Mrg lesion is 70% stenosed.   1st Mrg lesion is 99% stenosed.   Ost Cx to Prox Cx lesion is 70% stenosed.   Mid RCA stent is patent.   The left ventricular systolic function is normal.   LV end diastolic pressure is normal.   The left ventricular ejection fraction is 55-65% by visual estimate.   There is no aortic valve stenosis. Severe calcific three-vessel coronary artery disease.  The change in anatomy from 2020 is the 99% lesion in the OM.  There is proximal disease in the OM at the bifurcation with the circumflex.  Will get a surgical evaluation for CABG.  His symptom burden has been increasing of late so we will restart heparin 2 hours after the TR band has been removed.    Cardiac Studies   See above  Patient Profile     77 y.o. male admitted with [place] and found to have 3 vessel CAD.  Assessment & Plan    3V CAD - he will undergo CABG next week. He is pain free and denies sob. Turn off the IV heparin tomorrow or when recommended by Dr. Tenny Craw. H/o myasthenia -  stable     For questions or updates, please contact [place] Please consult [email or web address] for contact info under     Signed, Cristopher Peru, MD  04/17/2022, 10:50 AM

## 2022-04-17 NOTE — Progress Notes (Signed)
ANTICOAGULATION CONSULT NOTE  Pharmacy Consult for Heparin Indication: chest pain/ACS  Allergies  Allergen Reactions   Zetia [Ezetimibe] Other (See Comments)    Weakness, shortness of breath   Demerol [Meperidine Hcl]    Statins Other (See Comments)    myalgias    Patient Measurements: Height: '5\' 6"'$  (167.6 cm) Weight: 79.5 kg (175 lb 4.8 oz) IBW/kg (Calculated) : 63.8 Heparin Dosing Weight: 75 kg  Vital Signs: Temp: 98.2 F (36.8 C) (02/24 1124) Temp Source: Oral (02/24 1124) BP: 122/75 (02/24 1124) Pulse Rate: 81 (02/24 1157)  Labs: Recent Labs    04/16/22 0039 04/16/22 0655 04/17/22 0030  HGB 10.3*  --  10.0*  HCT 31.3*  --  31.6*  PLT 257  --  238  HEPARINUNFRC 0.61  --  0.34  CREATININE  --  1.07  --      Estimated Creatinine Clearance: 58.2 mL/min (by C-G formula based on SCr of 1.07 mg/dL).   Medical History: Past Medical History:  Diagnosis Date   Dysphagia    Heart attack ([place]) 2020   Hyperlipidemia    Kidney stones    Myasthenia gravis ([place])    STEMI (ST elevation myocardial infarction) ([place])     Medications:  Medications Prior to Admission  Medication Sig Dispense Refill Last Dose   alum & mag hydroxide-simeth (MINTOX) 200-200-20 MG/5ML suspension Take 30 mLs by mouth as needed for indigestion or heartburn.   Past Week   aspirin 81 MG chewable tablet Chew 1 tablet (81 mg total) by mouth daily. 90 tablet 1 Past Week   Calcium Carbonate-Vit D-Min (CALCIUM 1200 PO) Take 1 tablet by mouth daily.   Past Week   Cyanocobalamin (VITAMIN B-12 PO) Take 1 tablet by mouth daily.   Past Week   ferrous gluconate (FERGON) 324 MG tablet Take 1 tablet by mouth 3 (three) times a week. Monday, Wednesday AND Friday   Past Week   isosorbide mononitrate (IMDUR) 60 MG 24 hr tablet Take 1 tablet (60 mg total) by mouth daily. 90 tablet 3 Past Week   metoprolol succinate (TOPROL-XL) 25 MG 24 hr tablet Take 0.5 tablets (12.5 mg total) by mouth daily. 90 tablet 1  04/13/2022 at Afternoon   nitroGLYCERIN (NITROSTAT) 0.4 MG SL tablet Place 1 tablet (0.4 mg total) under the tongue every 5 (five) minutes as needed for chest pain. 25 tablet 1 Past Week   Pitavastatin Calcium 2 MG TABS Take 1 tablet (2 mg total) by mouth daily. (Patient taking differently: Take 2 mg by mouth See admin instructions. Take 2 mg (1 tablet) by mouth on Monday's and Friday's.) 90 tablet 3 Past Week   predniSONE (DELTASONE) 10 MG tablet Take 1 tablet (10 mg total) by mouth daily with breakfast. 90 tablet 3 Past Week   ramipril (ALTACE) 5 MG capsule Take 1 capsule (5 mg total) by mouth daily. 90 capsule 3 Past Week   ranolazine (RANEXA) 500 MG 12 hr tablet Take 1 tablet (500 mg total) by mouth 2 (two) times daily. 60 tablet 2 Past Week    Assessment: 77 y.o. male with chest pain for heparin.  Admitted to [facility] 2/21 and started heparin at that time.  Chest CT negative for PE.    Cath 2/23 finding severe multivessel CAD - plan for CABG 2/26 heparin drip restarted after cath 950 uts/hr with heparin level at goal 0.3 CBC stable, no s/sx of bleeding.  Goal of Therapy:  Heparin level 0.3-0.7 units/ml Monitor platelets by anticoagulation  protocol: Yes   Plan:   heparin infusion at 950 units/h Monitor daily HL, CBC, and for s/sx of bleeding    Bonnita Nasuti Pharm.D. CPP, BCPS Clinical Pharmacist [phone] 04/17/2022 2:08 PM   Please check AMION for all [place] phone numbers After 10:00 PM, call [place] [phone]

## 2022-04-17 NOTE — Progress Notes (Signed)
CARDIAC REHAB PHASE I   Came to see pt. Pt stated that he has already been walking around and moving. Did pre-op ed with pt and family. Discussed staying in the tube, surgery, IS use, and importance of mobility. Pt very receptive and had multiple questions.   [place], MS, ACSM-CEP 04/17/2022 12:27 PM

## 2022-04-18 ENCOUNTER — Inpatient Hospital Stay (HOSPITAL_COMMUNITY): Payer: Non-veteran care

## 2022-04-18 DIAGNOSIS — Z01810 Encounter for preprocedural cardiovascular examination: Secondary | ICD-10-CM | POA: Diagnosis not present

## 2022-04-18 DIAGNOSIS — I214 Non-ST elevation (NSTEMI) myocardial infarction: Secondary | ICD-10-CM | POA: Diagnosis not present

## 2022-04-18 LAB — BLOOD GAS, ARTERIAL
Acid-base deficit: 2 mmol/L (ref 0.0–2.0)
Bicarbonate: 22.3 mmol/L (ref 20.0–28.0)
Drawn by: 36849
O2 Saturation: 99.1 %
Patient temperature: 37
pCO2 arterial: 36 mmHg (ref 32–48)
pH, Arterial: 7.4 (ref 7.35–7.45)
pO2, Arterial: 98 mmHg (ref 83–108)

## 2022-04-18 LAB — COMPREHENSIVE METABOLIC PANEL
ALT: 14 U/L (ref 0–44)
AST: 27 U/L (ref 15–41)
Albumin: 3.9 g/dL (ref 3.5–5.0)
Alkaline Phosphatase: 54 U/L (ref 38–126)
Anion gap: 10 (ref 5–15)
BUN: 18 mg/dL (ref 8–23)
CO2: 23 mmol/L (ref 22–32)
Calcium: 9.2 mg/dL (ref 8.9–10.3)
Chloride: 106 mmol/L (ref 98–111)
Creatinine, Ser: 1.09 mg/dL (ref 0.61–1.24)
GFR, Estimated: >60 mL/min (ref >60)
Glucose, Bld: 125 mg/dL — ABNORMAL HIGH (ref 70–99)
Potassium: 4.1 mmol/L (ref 3.5–5.1)
Sodium: 139 mmol/L (ref 135–145)
Total Bilirubin: 0.7 mg/dL (ref 0.3–1.2)
Total Protein: 6.7 g/dL (ref 6.5–8.1)

## 2022-04-18 LAB — URINALYSIS, ROUTINE W REFLEX MICROSCOPIC
Bilirubin Urine: NEGATIVE
Glucose, UA: NEGATIVE mg/dL
Hgb urine dipstick: NEGATIVE
Ketones, ur: NEGATIVE mg/dL
Leukocytes,Ua: NEGATIVE
Nitrite: NEGATIVE
Protein, ur: NEGATIVE mg/dL
Specific Gravity, Urine: 1.01 (ref 1.005–1.030)
pH: 6 (ref 5.0–8.0)

## 2022-04-18 LAB — APTT: aPTT: 55 seconds — ABNORMAL HIGH (ref 24–36)

## 2022-04-18 LAB — CBC
HCT: 29.6 % — ABNORMAL LOW (ref 39.0–52.0)
Hemoglobin: 9.9 g/dL — ABNORMAL LOW (ref 13.0–17.0)
MCH: 31.5 pg (ref 26.0–34.0)
MCHC: 33.4 g/dL (ref 30.0–36.0)
MCV: 94.3 fL (ref 80.0–100.0)
Platelets: 234 10*3/uL (ref 150–400)
RBC: 3.14 MIL/uL — ABNORMAL LOW (ref 4.22–5.81)
RDW: 13.5 % (ref 11.5–15.5)
WBC: 6.5 10*3/uL (ref 4.0–10.5)
nRBC: 0 % (ref 0.0–0.2)

## 2022-04-18 LAB — RESP PANEL BY RT-PCR (RSV, FLU A&B, COVID)  RVPGX2
Influenza A by PCR: NEGATIVE
Influenza B by PCR: NEGATIVE
Resp Syncytial Virus by PCR: NEGATIVE
SARS Coronavirus 2 by RT PCR: NEGATIVE

## 2022-04-18 LAB — HEPARIN LEVEL (UNFRACTIONATED): Heparin Unfractionated: 0.4 IU/mL (ref 0.30–0.70)

## 2022-04-18 LAB — LIPOPROTEIN A (LPA): Lipoprotein (a): 30.1 nmol/L — ABNORMAL HIGH (ref <75.0)

## 2022-04-18 LAB — PROTIME-INR
INR: 1 (ref 0.8–1.2)
Prothrombin Time: 13.4 seconds (ref 11.4–15.2)

## 2022-04-18 LAB — ABO/RH: ABO/RH(D): O POS

## 2022-04-18 MED ORDER — PLASMA-LYTE A IV SOLN
INTRAVENOUS | Status: DC
  Filled 2022-04-18: qty 2.5

## 2022-04-18 MED ORDER — HEPARIN 30,000 UNITS/1000 ML (OHS) CELLSAVER SOLUTION
Status: DC
  Filled 2022-04-18: qty 1000

## 2022-04-18 MED ORDER — VANCOMYCIN HCL 1000 MG IV SOLR
INTRAVENOUS | Status: DC
  Filled 2022-04-18: qty 20

## 2022-04-18 MED ORDER — MILRINONE LACTATE IN DEXTROSE 20-5 MG/100ML-% IV SOLN
0.3000 ug/kg/min | INTRAVENOUS | Status: DC
  Filled 2022-04-18: qty 100

## 2022-04-18 MED ORDER — CHLORHEXIDINE GLUCONATE 0.12 % MT SOLN
15.0000 mL | Freq: Once | OROMUCOSAL | Status: AC
  Administered 2022-04-19: 15 mL via OROMUCOSAL
  Filled 2022-04-18: qty 15

## 2022-04-18 MED ORDER — MANNITOL 20 % IV SOLN
INTRAVENOUS | Status: DC
  Filled 2022-04-18: qty 13

## 2022-04-18 MED ORDER — VANCOMYCIN HCL 1250 MG/250ML IV SOLN
1250.0000 mg | INTRAVENOUS | Status: AC
  Administered 2022-04-19: 1250 mg via INTRAVENOUS
  Filled 2022-04-18: qty 250

## 2022-04-18 MED ORDER — TRANEXAMIC ACID 1000 MG/10ML IV SOLN
1.5000 mg/kg/h | INTRAVENOUS | Status: AC
  Administered 2022-04-19: 1.5 mg/kg/h via INTRAVENOUS
  Filled 2022-04-18: qty 25

## 2022-04-18 MED ORDER — CEFAZOLIN SODIUM-DEXTROSE 2-4 GM/100ML-% IV SOLN
2.0000 g | INTRAVENOUS | Status: AC
  Administered 2022-04-19: 2 g via INTRAVENOUS
  Filled 2022-04-18: qty 100

## 2022-04-18 MED ORDER — NOREPINEPHRINE 4 MG/250ML-% IV SOLN
0.0000 ug/min | INTRAVENOUS | Status: AC
  Administered 2022-04-19: 2 ug/min via INTRAVENOUS
  Filled 2022-04-18: qty 250

## 2022-04-18 MED ORDER — CHLORHEXIDINE GLUCONATE 4 % EX LIQD
60.0000 mL | Freq: Once | CUTANEOUS | Status: AC
  Administered 2022-04-19: 4 via TOPICAL
  Filled 2022-04-18: qty 60

## 2022-04-18 MED ORDER — NITROGLYCERIN IN D5W 200-5 MCG/ML-% IV SOLN
2.0000 ug/min | INTRAVENOUS | Status: AC
  Administered 2022-04-19: 40 ug/min via INTRAVENOUS
  Filled 2022-04-18: qty 250

## 2022-04-18 MED ORDER — CHLORHEXIDINE GLUCONATE 4 % EX LIQD
60.0000 mL | Freq: Once | CUTANEOUS | Status: AC
  Administered 2022-04-18: 4 via TOPICAL
  Filled 2022-04-18: qty 60

## 2022-04-18 MED ORDER — TRANEXAMIC ACID (OHS) PUMP PRIME SOLUTION
2.0000 mg/kg | INTRAVENOUS | Status: DC
  Filled 2022-04-18: qty 1.6

## 2022-04-18 MED ORDER — INSULIN REGULAR(HUMAN) IN NACL 100-0.9 UT/100ML-% IV SOLN
INTRAVENOUS | Status: AC
  Administered 2022-04-19: 1.8 [IU]/h via INTRAVENOUS
  Filled 2022-04-18: qty 100

## 2022-04-18 MED ORDER — PHENYLEPHRINE HCL-NACL 20-0.9 MG/250ML-% IV SOLN
30.0000 ug/min | INTRAVENOUS | Status: AC
  Administered 2022-04-19: 40 ug/min via INTRAVENOUS
  Filled 2022-04-18: qty 250

## 2022-04-18 MED ORDER — TRANEXAMIC ACID (OHS) BOLUS VIA INFUSION
15.0000 mg/kg | INTRAVENOUS | Status: AC
  Administered 2022-04-19: 1203 mg via INTRAVENOUS
  Filled 2022-04-18: qty 1203

## 2022-04-18 MED ORDER — BISACODYL 5 MG PO TBEC
5.0000 mg | DELAYED_RELEASE_TABLET | Freq: Once | ORAL | Status: AC
  Administered 2022-04-18: 5 mg via ORAL
  Filled 2022-04-18: qty 1

## 2022-04-18 MED ORDER — TEMAZEPAM 15 MG PO CAPS
15.0000 mg | ORAL_CAPSULE | Freq: Once | ORAL | Status: AC | PRN
  Administered 2022-04-18: 15 mg via ORAL
  Filled 2022-04-18: qty 1

## 2022-04-18 MED ORDER — POTASSIUM CHLORIDE 2 MEQ/ML IV SOLN
80.0000 meq | INTRAVENOUS | Status: DC
  Filled 2022-04-18: qty 40

## 2022-04-18 MED ORDER — EPINEPHRINE HCL 5 MG/250ML IV SOLN IN NS
0.0000 ug/min | INTRAVENOUS | Status: AC
  Administered 2022-04-19: 2 ug/min via INTRAVENOUS
  Filled 2022-04-18: qty 250

## 2022-04-18 MED ORDER — DEXMEDETOMIDINE HCL IN NACL 400 MCG/100ML IV SOLN
0.1000 ug/kg/h | INTRAVENOUS | Status: AC
  Administered 2022-04-19: .4 ug/kg/h via INTRAVENOUS
  Filled 2022-04-18: qty 100

## 2022-04-18 NOTE — Anesthesia Preprocedure Evaluation (Signed)
Anesthesia Evaluation  Patient identified by MRN, date of birth, ID band Patient awake    Reviewed: Allergy & Precautions, NPO status , Patient's Chart, lab work & pertinent test results, reviewed documented beta blocker date and time   History of Anesthesia Complications Negative for: history of anesthetic complications  Airway Mallampati: III  TM Distance: >3 FB Neck ROM: Limited    Dental  (+) Dental Advisory Given, Loose,    Pulmonary neg pulmonary ROS   Pulmonary exam normal        Cardiovascular hypertension, Pt. on home beta blockers and Pt. on medications + CAD and + Past MI  Normal cardiovascular exam   '24 Cath -   Prox LAD to Mid LAD lesion is 75% stenosed.   Ost LAD to Prox LAD lesion is 75% stenosed.   Prox Cx to Mid Cx lesion is 50% stenosed.   Ost RCA to Prox RCA lesion is 70% stenosed.   Dist RCA lesion is 80% stenosed with 80% stenosed side branch in Ost RPDA.   Ost 1st Diag lesion is 100% stenosed.   Ost 2nd Diag to 2nd Diag lesion is 100% stenosed.   Ost 1st Mrg lesion is 70% stenosed.   1st Mrg lesion is 99% stenosed.   Ost Cx to Prox Cx lesion is 70% stenosed.   Mid RCA stent is patent.   The left ventricular systolic function is normal.   LV end diastolic pressure is normal.   The left ventricular ejection fraction is 55-65% by visual estimate.   There is no aortic valve stenosis.  '24 TTE - EF 60 to 65%. Grade I diastolic dysfunction (impaired relaxation). No valvulopathy      Neuro/Psych  Neuromuscular disease ([place])  negative psych ROS   GI/Hepatic Neg liver ROS,GERD  Controlled and Medicated,,  Endo/Other  negative endocrine ROS    Renal/GU negative Renal ROS     Musculoskeletal negative musculoskeletal ROS (+)    Abdominal   Peds  Hematology  (+) Blood dyscrasia, anemia   Anesthesia Other Findings   Reproductive/Obstetrics                              Anesthesia Physical Anesthesia Plan  ASA: 4  Anesthesia Plan: General   Post-op Pain Management:    Induction: Intravenous  PONV Risk Score and Plan: 2 and Treatment may vary due to age or medical condition and Ondansetron  Airway Management Planned: Oral ETT  Additional Equipment: Arterial line, PA Cath, TEE, Ultrasound Guidance Line Placement and CVP  Intra-op Plan:   Post-operative Plan: Post-operative intubation/ventilation  Informed Consent: I have reviewed the patients History and Physical, chart, labs and discussed the procedure including the risks, benefits and alternatives for the proposed anesthesia with the patient or authorized representative who has indicated his/her understanding and acceptance.     Dental advisory given  Plan Discussed with: CRNA and Anesthesiologist  Anesthesia Plan Comments:         Anesthesia Quick Evaluation

## 2022-04-18 NOTE — Progress Notes (Signed)
Rounding Note    Patient Name: Adrian Bell Date of Encounter: 04/18/2022  [place] Cardiologist: Shirlee More, MD   Subjective   No chest pain or sob.   Inpatient Medications    Scheduled Meds:  aspirin EC  81 mg Oral Daily   bisacodyl  5 mg Oral Once   chlorhexidine  60 mL Topical Once   And   [START ON 04/19/2022] chlorhexidine  60 mL Topical Once   [START ON 04/19/2022] chlorhexidine  15 mL Mouth/Throat Once   isosorbide mononitrate  60 mg Oral Daily   metoprolol succinate  12.5 mg Oral Daily   pravastatin  40 mg Oral q1800   predniSONE  10 mg Oral Q breakfast   ramipril  5 mg Oral Daily   ranolazine  500 mg Oral BID   sodium chloride flush  3 mL Intravenous Q12H   Continuous Infusions:  sodium chloride     heparin 950 Units/hr (04/17/22 1530)   PRN Meds: sodium chloride, acetaminophen, nitroGLYCERIN, ondansetron (ZOFRAN) IV, sodium chloride flush, temazepam   Vital Signs    Vitals:   04/17/22 2317 04/18/22 0418 04/18/22 0719 04/18/22 1010  BP: (!) 97/54 (!) 117/51 (!) 141/78   Pulse:  (!) 55 67   Resp:  13 20   Temp: 98 F (36.7 C) 98 F (36.7 C) 98.4 F (36.9 C)   TempSrc: Oral Oral Oral   SpO2:   96%   Weight:    80.2 kg  Height:    '5\' 6"'$  (1.676 m)    Intake/Output Summary (Last 24 hours) at 04/18/2022 1020 Last data filed at 04/18/2022 0900 Gross per 24 hour  Intake 926.5 ml  Output 1190 ml  Net -263.5 ml      04/18/2022   10:10 AM 04/16/2022    3:00 AM 04/15/2022   10:07 PM  Last 3 Weights  Weight (lbs) 176 lb 11.2 oz 175 lb 4.8 oz 175 lb 4.3 oz  Weight (kg) 80.151 kg 79.516 kg 79.5 kg      Telemetry    nsr - Personally Reviewed  ECG    none - Personally Reviewed  Physical Exam   GEN: No acute distress.   Neck: No JVD Cardiac: RRR, no murmurs, rubs, or gallops.  Respiratory: Clear to auscultation bilaterally. GI: Soft, nontender, non-distended  MS: No edema; No deformity. Neuro:  Nonfocal  Psych: Normal affect    Labs    High Sensitivity Troponin:  No results for input(s): "TROPONINIHS" in the last 720 hours.   Chemistry Recent Labs  Lab 04/16/22 0655  NA 142  K 4.0  CL 108  CO2 22  GLUCOSE 94  BUN 17  CREATININE 1.07  CALCIUM 8.7*  GFRNONAA >60  ANIONGAP 12    Lipids No results for input(s): "CHOL", "TRIG", "HDL", "LABVLDL", "LDLCALC", "CHOLHDL" in the last 168 hours.  Hematology Recent Labs  Lab 04/16/22 0039 04/17/22 0030 04/18/22 0022  WBC 7.9 5.9 6.5  RBC 3.27* 3.25* 3.14*  HGB 10.3* 10.0* 9.9*  HCT 31.3* 31.6* 29.6*  MCV 95.7 97.2 94.3  MCH 31.5 30.8 31.5  MCHC 32.9 31.6 33.4  RDW 13.8 13.7 13.5  PLT 257 238 234   Thyroid No results for input(s): "TSH", "FREET4" in the last 168 hours.  BNPNo results for input(s): "BNP", "PROBNP" in the last 168 hours.  DDimer No results for input(s): "DDIMER" in the last 168 hours.   Radiology    CT CHEST WO CONTRAST  Result Date:  04/18/2022 CLINICAL DATA:  77 year old male with history of chest pain. EXAM: CT CHEST WITHOUT CONTRAST TECHNIQUE: Multidetector CT imaging of the chest was performed following the standard protocol without IV contrast. RADIATION DOSE REDUCTION: This exam was performed according to the departmental dose-optimization program which includes automated exposure control, adjustment of the mA and/or kV according to patient size and/or use of iterative reconstruction technique. COMPARISON:  Chest CTA 04/14/2022. FINDINGS: Cardiovascular: Heart size is mildly enlarged. There is no significant pericardial fluid, thickening or pericardial calcification. There is aortic atherosclerosis, as well as atherosclerosis of the great vessels of the mediastinum and the coronary arteries, including calcified atherosclerotic plaque in the left main, left anterior descending, left circumflex and right coronary arteries. Mediastinum/Nodes: No pathologically enlarged mediastinal or hilar lymph nodes. Please note that accurate exclusion  of hilar adenopathy is limited on noncontrast CT scans. Small hiatal hernia. No axillary lymphadenopathy. Lungs/Pleura: No acute consolidative airspace disease. No pleural effusions. No definite suspicious appearing pulmonary nodules or masses are noted. Upper Abdomen: Aortic atherosclerosis. Multiple calcifications in the renal hila bilaterally, the majority of which appear to be vascular, however, there also appear to be small calculi, largest of which is in the interpolar collecting system of the left kidney measuring 11 x 5 mm. Exophytic 2.2 cm low-attenuation lesion extending off the medial aspect of the upper pole of the left kidney and interpolar region of the left kidney, incompletely characterized on today's noncontrast CT examination, but statistically likely to represent cysts (no imaging follow-up recommended). Multiple calcified gallstones lying dependently in the gallbladder. Musculoskeletal: There are no aggressive appearing lytic or blastic lesions noted in the visualized portions of the skeleton. IMPRESSION: 1. No acute findings are noted in the thorax to account for the patient's symptoms. 2. Aortic atherosclerosis, in addition to left main and three-vessel coronary artery disease. Please note that although the presence of coronary artery calcium documents the presence of coronary artery disease, the severity of this disease and any potential stenosis cannot be assessed on this non-gated CT examination. Assessment for potential risk factor modification, dietary therapy or pharmacologic therapy may be warranted, if clinically indicated. 3. Mild cardiomegaly. 4. Nephrolithiasis measuring up to 11 x 5 mm in the interpolar collecting system of the right kidney. 5. Cholelithiasis. Aortic Atherosclerosis (ICD10-I70.0). Electronically Signed   By: Vinnie Langton M.D.   On: 04/18/2022 06:00   CARDIAC CATHETERIZATION  Result Date: 04/16/2022   Prox LAD to Mid LAD lesion is 75% stenosed.   Ost LAD to  Prox LAD lesion is 75% stenosed.   Prox Cx to Mid Cx lesion is 50% stenosed.   Ost RCA to Prox RCA lesion is 70% stenosed.   Dist RCA lesion is 80% stenosed with 80% stenosed side branch in Ost RPDA.   Ost 1st Diag lesion is 100% stenosed.   Ost 2nd Diag to 2nd Diag lesion is 100% stenosed.   Ost 1st Mrg lesion is 70% stenosed.   1st Mrg lesion is 99% stenosed.   Ost Cx to Prox Cx lesion is 70% stenosed.   Mid RCA stent is patent.   The left ventricular systolic function is normal.   LV end diastolic pressure is normal.   The left ventricular ejection fraction is 55-65% by visual estimate.   There is no aortic valve stenosis. Severe calcific three-vessel coronary artery disease.  The change in anatomy from 2020 is the 99% lesion in the OM.  There is proximal disease in the OM at the bifurcation with  the circumflex.  Will get a surgical evaluation for CABG.  His symptom burden has been increasing of late so we will restart heparin 2 hours after the TR band has been removed.    Cardiac Studies   See above  Patient Profile     77 y.o. male admitted with [place] and found to have surgical CAD  Assessment & Plan    3V CAD - he is pending CABG this week. He is pain free. Hold IV heparin as per CVTS. HTN - his bp is stable.     For questions or updates, please contact [facility] Please consult [email or web address] for contact info under      Signed, Cristopher Peru, MD  04/18/2022, 10:20 AM

## 2022-04-18 NOTE — Progress Notes (Signed)
Pt states allergy to statins.  MD paged about scheduled Pravastatin and explained allergy to and patient states that he takes Pitavastatin at home.  MD orders to hold Pravastatin due to not having Pitavastatin in our system.

## 2022-04-18 NOTE — Progress Notes (Signed)
ANTICOAGULATION CONSULT NOTE  Pharmacy Consult for Heparin Indication: chest pain/ACS  Allergies  Allergen Reactions   Zetia [Ezetimibe] Other (See Comments)    Weakness, shortness of breath   Demerol [Meperidine Hcl]    Statins Other (See Comments)    myalgias    Patient Measurements: Height: '5\' 6"'$  (167.6 cm) Weight: 80.2 kg (176 lb 11.2 oz) IBW/kg (Calculated) : 63.8 Heparin Dosing Weight: 75 kg  Vital Signs: Temp: 97.6 F (36.4 C) (02/25 1143) Temp Source: Oral (02/25 1143) BP: 136/96 (02/25 1143) Pulse Rate: 64 (02/25 1143)  Labs: Recent Labs    04/16/22 0039 04/16/22 0655 04/17/22 0030 04/18/22 0022 04/18/22 1141  HGB 10.3*  --  10.0* 9.9*  --   HCT 31.3*  --  31.6* 29.6*  --   PLT 257  --  238 234  --   APTT  --   --   --   --  55*  LABPROT  --   --   --   --  13.4  INR  --   --   --   --  1.0  HEPARINUNFRC 0.61  --  0.34 0.40  --   CREATININE  --  1.07  --   --  1.09     Estimated Creatinine Clearance: 57.4 mL/min (by C-G formula based on SCr of 1.09 mg/dL).   Medical History: Past Medical History:  Diagnosis Date   Dysphagia    Heart attack ([place]) 2020   Hyperlipidemia    Kidney stones    Myasthenia gravis ([place])    STEMI (ST elevation myocardial infarction) ([place])     Medications:  Medications Prior to Admission  Medication Sig Dispense Refill Last Dose   alum & mag hydroxide-simeth (MINTOX) 200-200-20 MG/5ML suspension Take 30 mLs by mouth as needed for indigestion or heartburn.   Past Week   aspirin 81 MG chewable tablet Chew 1 tablet (81 mg total) by mouth daily. 90 tablet 1 Past Week   Calcium Carbonate-Vit D-Min (CALCIUM 1200 PO) Take 1 tablet by mouth daily.   Past Week   Cyanocobalamin (VITAMIN B-12 PO) Take 1 tablet by mouth daily.   Past Week   ferrous gluconate (FERGON) 324 MG tablet Take 1 tablet by mouth 3 (three) times a week. Monday, Wednesday AND Friday   Past Week   isosorbide mononitrate (IMDUR) 60 MG 24 hr tablet Take 1  tablet (60 mg total) by mouth daily. 90 tablet 3 Past Week   metoprolol succinate (TOPROL-XL) 25 MG 24 hr tablet Take 0.5 tablets (12.5 mg total) by mouth daily. 90 tablet 1 04/13/2022 at Afternoon   nitroGLYCERIN (NITROSTAT) 0.4 MG SL tablet Place 1 tablet (0.4 mg total) under the tongue every 5 (five) minutes as needed for chest pain. 25 tablet 1 Past Week   Pitavastatin Calcium 2 MG TABS Take 1 tablet (2 mg total) by mouth daily. (Patient taking differently: Take 2 mg by mouth See admin instructions. Take 2 mg (1 tablet) by mouth on Monday's and Friday's.) 90 tablet 3 Past Week   predniSONE (DELTASONE) 10 MG tablet Take 1 tablet (10 mg total) by mouth daily with breakfast. 90 tablet 3 Past Week   ramipril (ALTACE) 5 MG capsule Take 1 capsule (5 mg total) by mouth daily. 90 capsule 3 Past Week   ranolazine (RANEXA) 500 MG 12 hr tablet Take 1 tablet (500 mg total) by mouth 2 (two) times daily. 60 tablet 2 Past Week    Assessment: 77 y.o. male  with chest pain for heparin.  Admitted to [facility] and started heparin at that time.  Chest CT negative for PE.    Cath 2/23 finding severe multivessel CAD - plan for CABG 2/26 heparin drip restarted after cath 950 uts/hr with heparin level at goal 0.4 CBC stable, no s/sx of bleeding.  Goal of Therapy:  Heparin level 0.3-0.7 units/ml Monitor platelets by anticoagulation protocol: Yes   Plan:   heparin infusion at 950 units/h Monitor daily HL, CBC, and for s/sx of bleeding    Bonnita Nasuti Pharm.D. CPP, BCPS Clinical Pharmacist [phone] 04/18/2022 2:24 PM   Please check AMION for all [facility] phone numbers After 10:00 PM, call [place] [phone]

## 2022-04-19 ENCOUNTER — Inpatient Hospital Stay (HOSPITAL_COMMUNITY): Payer: Non-veteran care

## 2022-04-19 ENCOUNTER — Other Ambulatory Visit: Payer: Self-pay

## 2022-04-19 ENCOUNTER — Encounter (HOSPITAL_COMMUNITY): Payer: Self-pay | Admitting: Student in an Organized Health Care Education/Training Program

## 2022-04-19 ENCOUNTER — Inpatient Hospital Stay (HOSPITAL_COMMUNITY)
Admission: RE | Disposition: A | Discharge status: Home / Self Care | Payer: Self-pay | Source: Home / Self Care | Attending: Cardiothoracic Surgery

## 2022-04-19 DIAGNOSIS — I10 Essential (primary) hypertension: Secondary | ICD-10-CM

## 2022-04-19 DIAGNOSIS — I2510 Atherosclerotic heart disease of native coronary artery without angina pectoris: Secondary | ICD-10-CM

## 2022-04-19 DIAGNOSIS — G7000 Myasthenia gravis without (acute) exacerbation: Secondary | ICD-10-CM | POA: Diagnosis not present

## 2022-04-19 DIAGNOSIS — I25110 Atherosclerotic heart disease of native coronary artery with unstable angina pectoris: Secondary | ICD-10-CM | POA: Diagnosis not present

## 2022-04-19 DIAGNOSIS — Z9089 Acquired absence of other organs: Secondary | ICD-10-CM

## 2022-04-19 DIAGNOSIS — I252 Old myocardial infarction: Secondary | ICD-10-CM

## 2022-04-19 DIAGNOSIS — Z951 Presence of aortocoronary bypass graft: Secondary | ICD-10-CM

## 2022-04-19 DIAGNOSIS — G709 Myoneural disorder, unspecified: Secondary | ICD-10-CM

## 2022-04-19 DIAGNOSIS — I214 Non-ST elevation (NSTEMI) myocardial infarction: Secondary | ICD-10-CM | POA: Diagnosis not present

## 2022-04-19 HISTORY — PX: TOTAL THYMECTOMY: SHX2546

## 2022-04-19 HISTORY — PX: TEE WITHOUT CARDIOVERSION: SHX5443

## 2022-04-19 HISTORY — DX: Presence of aortocoronary bypass graft: Z95.1

## 2022-04-19 HISTORY — PX: CLIPPING OF ATRIAL APPENDAGE: SHX5773

## 2022-04-19 HISTORY — PX: THYMECTOMY: SHX6121

## 2022-04-19 HISTORY — PX: CORONARY ARTERY BYPASS GRAFT: SHX141

## 2022-04-19 HISTORY — DX: Acquired absence of other organs: Z90.89

## 2022-04-19 HISTORY — PX: OTHER SURGICAL HISTORY: SHX169

## 2022-04-19 LAB — POCT I-STAT, CHEM 8
BUN: 18 mg/dL (ref 8–23)
BUN: 13 mg/dL (ref 8–23)
BUN: 18 mg/dL (ref 8–23)
BUN: 17 mg/dL (ref 8–23)
BUN: 16 mg/dL (ref 8–23)
BUN: 17 mg/dL (ref 8–23)
BUN: 17 mg/dL (ref 8–23)
BUN: 16 mg/dL (ref 8–23)
Calcium, Ion: 1.21 mmol/L (ref 1.15–1.40)
Calcium, Ion: 1.02 mmol/L — ABNORMAL LOW (ref 1.15–1.40)
Calcium, Ion: 1.21 mmol/L (ref 1.15–1.40)
Calcium, Ion: 1.04 mmol/L — ABNORMAL LOW (ref 1.15–1.40)
Calcium, Ion: 0.6 mmol/L — CL (ref 1.15–1.40)
Calcium, Ion: 1.01 mmol/L — ABNORMAL LOW (ref 1.15–1.40)
Calcium, Ion: 1 mmol/L — ABNORMAL LOW (ref 1.15–1.40)
Calcium, Ion: 1.23 mmol/L (ref 1.15–1.40)
Chloride: 106 mmol/L (ref 98–111)
Chloride: 108 mmol/L (ref 98–111)
Chloride: 115 mmol/L — ABNORMAL HIGH (ref 98–111)
Chloride: 107 mmol/L (ref 98–111)
Chloride: 105 mmol/L (ref 98–111)
Chloride: 109 mmol/L (ref 98–111)
Chloride: 108 mmol/L (ref 98–111)
Chloride: 109 mmol/L (ref 98–111)
Creatinine, Ser: 0.9 mg/dL (ref 0.61–1.24)
Creatinine, Ser: 0.6 mg/dL — ABNORMAL LOW (ref 0.61–1.24)
Creatinine, Ser: 0.9 mg/dL (ref 0.61–1.24)
Creatinine, Ser: 0.8 mg/dL (ref 0.61–1.24)
Creatinine, Ser: 0.7 mg/dL (ref 0.61–1.24)
Creatinine, Ser: 0.7 mg/dL (ref 0.61–1.24)
Creatinine, Ser: 0.7 mg/dL (ref 0.61–1.24)
Creatinine, Ser: 0.6 mg/dL — ABNORMAL LOW (ref 0.61–1.24)
Glucose, Bld: 107 mg/dL — ABNORMAL HIGH (ref 70–99)
Glucose, Bld: 120 mg/dL — ABNORMAL HIGH (ref 70–99)
Glucose, Bld: 92 mg/dL (ref 70–99)
Glucose, Bld: 132 mg/dL — ABNORMAL HIGH (ref 70–99)
Glucose, Bld: 155 mg/dL — ABNORMAL HIGH (ref 70–99)
Glucose, Bld: 222 mg/dL — ABNORMAL HIGH (ref 70–99)
Glucose, Bld: 139 mg/dL — ABNORMAL HIGH (ref 70–99)
Glucose, Bld: 155 mg/dL — ABNORMAL HIGH (ref 70–99)
HCT: 28 % — ABNORMAL LOW (ref 39.0–52.0)
HCT: 20 % — ABNORMAL LOW (ref 39.0–52.0)
HCT: 27 % — ABNORMAL LOW (ref 39.0–52.0)
HCT: 21 % — ABNORMAL LOW (ref 39.0–52.0)
HCT: 22 % — ABNORMAL LOW (ref 39.0–52.0)
HCT: 26 % — ABNORMAL LOW (ref 39.0–52.0)
HCT: 23 % — ABNORMAL LOW (ref 39.0–52.0)
HCT: 23 % — ABNORMAL LOW (ref 39.0–52.0)
Hemoglobin: 9.5 g/dL — ABNORMAL LOW (ref 13.0–17.0)
Hemoglobin: 6.8 g/dL — CL (ref 13.0–17.0)
Hemoglobin: 9.2 g/dL — ABNORMAL LOW (ref 13.0–17.0)
Hemoglobin: 7.1 g/dL — ABNORMAL LOW (ref 13.0–17.0)
Hemoglobin: 7.5 g/dL — ABNORMAL LOW (ref 13.0–17.0)
Hemoglobin: 8.8 g/dL — ABNORMAL LOW (ref 13.0–17.0)
Hemoglobin: 7.8 g/dL — ABNORMAL LOW (ref 13.0–17.0)
Hemoglobin: 7.8 g/dL — ABNORMAL LOW (ref 13.0–17.0)
Potassium: 3.8 mmol/L (ref 3.5–5.1)
Potassium: 3.2 mmol/L — ABNORMAL LOW (ref 3.5–5.1)
Potassium: 4.2 mmol/L (ref 3.5–5.1)
Potassium: 4.9 mmol/L (ref 3.5–5.1)
Potassium: 5.1 mmol/L (ref 3.5–5.1)
Potassium: 7.1 mmol/L (ref 3.5–5.1)
Potassium: 4.7 mmol/L (ref 3.5–5.1)
Potassium: 4.3 mmol/L (ref 3.5–5.1)
Sodium: 141 mmol/L (ref 135–145)
Sodium: 142 mmol/L (ref 135–145)
Sodium: 146 mmol/L — ABNORMAL HIGH (ref 135–145)
Sodium: 140 mmol/L (ref 135–145)
Sodium: 139 mmol/L (ref 135–145)
Sodium: 141 mmol/L (ref 135–145)
Sodium: 143 mmol/L (ref 135–145)
Sodium: 143 mmol/L (ref 135–145)
TCO2: 23 mmol/L (ref 22–32)
TCO2: 18 mmol/L — ABNORMAL LOW (ref 22–32)
TCO2: 23 mmol/L (ref 22–32)
TCO2: 24 mmol/L (ref 22–32)
TCO2: 22 mmol/L (ref 22–32)
TCO2: 24 mmol/L (ref 22–32)
TCO2: 23 mmol/L (ref 22–32)
TCO2: 27 mmol/L (ref 22–32)

## 2022-04-19 LAB — CBC
HCT: 32.3 % — ABNORMAL LOW (ref 39.0–52.0)
HCT: 28.2 % — ABNORMAL LOW (ref 39.0–52.0)
HCT: 22.8 % — ABNORMAL LOW (ref 39.0–52.0)
Hemoglobin: 10.6 g/dL — ABNORMAL LOW (ref 13.0–17.0)
Hemoglobin: 9.1 g/dL — ABNORMAL LOW (ref 13.0–17.0)
Hemoglobin: 7.7 g/dL — ABNORMAL LOW (ref 13.0–17.0)
MCH: 31 pg (ref 26.0–34.0)
MCH: 30.1 pg (ref 26.0–34.0)
MCH: 31.2 pg (ref 26.0–34.0)
MCHC: 32.8 g/dL (ref 30.0–36.0)
MCHC: 32.3 g/dL (ref 30.0–36.0)
MCHC: 33.8 g/dL (ref 30.0–36.0)
MCV: 94.4 fL (ref 80.0–100.0)
MCV: 93.4 fL (ref 80.0–100.0)
MCV: 92.3 fL (ref 80.0–100.0)
Platelets: 270 10*3/uL (ref 150–400)
Platelets: 148 10*3/uL — ABNORMAL LOW (ref 150–400)
Platelets: 129 10*3/uL — ABNORMAL LOW (ref 150–400)
RBC: 3.42 MIL/uL — ABNORMAL LOW (ref 4.22–5.81)
RBC: 3.02 MIL/uL — ABNORMAL LOW (ref 4.22–5.81)
RBC: 2.47 MIL/uL — ABNORMAL LOW (ref 4.22–5.81)
RDW: 13.7 % (ref 11.5–15.5)
RDW: 14.5 % (ref 11.5–15.5)
RDW: 14.6 % (ref 11.5–15.5)
WBC: 6.7 10*3/uL (ref 4.0–10.5)
WBC: 8.8 10*3/uL (ref 4.0–10.5)
WBC: 7 10*3/uL (ref 4.0–10.5)
nRBC: 0 % (ref 0.0–0.2)
nRBC: 0 % (ref 0.0–0.2)
nRBC: 0 % (ref 0.0–0.2)

## 2022-04-19 LAB — POCT I-STAT 7, (LYTES, BLD GAS, ICA,H+H)
Acid-base deficit: 1 mmol/L (ref 0.0–2.0)
Acid-base deficit: 3 mmol/L — ABNORMAL HIGH (ref 0.0–2.0)
Acid-base deficit: 3 mmol/L — ABNORMAL HIGH (ref 0.0–2.0)
Bicarbonate: 23.5 mmol/L (ref 20.0–28.0)
Bicarbonate: 21.9 mmol/L (ref 20.0–28.0)
Bicarbonate: 22.4 mmol/L (ref 20.0–28.0)
Calcium, Ion: 1.03 mmol/L — ABNORMAL LOW (ref 1.15–1.40)
Calcium, Ion: 1 mmol/L — ABNORMAL LOW (ref 1.15–1.40)
Calcium, Ion: 1.14 mmol/L — ABNORMAL LOW (ref 1.15–1.40)
HCT: 20 % — ABNORMAL LOW (ref 39.0–52.0)
HCT: 25 % — ABNORMAL LOW (ref 39.0–52.0)
HCT: 27 % — ABNORMAL LOW (ref 39.0–52.0)
Hemoglobin: 6.8 g/dL — CL (ref 13.0–17.0)
Hemoglobin: 8.5 g/dL — ABNORMAL LOW (ref 13.0–17.0)
Hemoglobin: 9.2 g/dL — ABNORMAL LOW (ref 13.0–17.0)
O2 Saturation: 100 %
O2 Saturation: 100 %
O2 Saturation: 98 %
Patient temperature: 35.8
Potassium: 5.9 mmol/L — ABNORMAL HIGH (ref 3.5–5.1)
Potassium: 4.9 mmol/L (ref 3.5–5.1)
Potassium: 4.2 mmol/L (ref 3.5–5.1)
Sodium: 139 mmol/L (ref 135–145)
Sodium: 142 mmol/L (ref 135–145)
Sodium: 145 mmol/L (ref 135–145)
TCO2: 25 mmol/L (ref 22–32)
TCO2: 23 mmol/L (ref 22–32)
TCO2: 24 mmol/L (ref 22–32)
pCO2 arterial: 36.5 mmHg (ref 32–48)
pCO2 arterial: 38 mmHg (ref 32–48)
pCO2 arterial: 39.9 mmHg (ref 32–48)
pH, Arterial: 7.416 (ref 7.35–7.45)
pH, Arterial: 7.369 (ref 7.35–7.45)
pH, Arterial: 7.352 (ref 7.35–7.45)
pO2, Arterial: 376 mmHg — ABNORMAL HIGH (ref 83–108)
pO2, Arterial: 324 mmHg — ABNORMAL HIGH (ref 83–108)
pO2, Arterial: 114 mmHg — ABNORMAL HIGH (ref 83–108)

## 2022-04-19 LAB — POCT I-STAT EG7
Acid-base deficit: 1 mmol/L (ref 0.0–2.0)
Bicarbonate: 24 mmol/L (ref 20.0–28.0)
Calcium, Ion: 1.06 mmol/L — ABNORMAL LOW (ref 1.15–1.40)
HCT: 22 % — ABNORMAL LOW (ref 39.0–52.0)
Hemoglobin: 7.5 g/dL — ABNORMAL LOW (ref 13.0–17.0)
O2 Saturation: 83 %
Potassium: 5.1 mmol/L (ref 3.5–5.1)
Sodium: 140 mmol/L (ref 135–145)
TCO2: 25 mmol/L (ref 22–32)
pCO2, Ven: 39.2 mmHg — ABNORMAL LOW (ref 44–60)
pH, Ven: 7.395 (ref 7.25–7.43)
pO2, Ven: 48 mmHg — ABNORMAL HIGH (ref 32–45)

## 2022-04-19 LAB — PREPARE RBC (CROSSMATCH)

## 2022-04-19 LAB — HEMOGLOBIN A1C
Hgb A1c MFr Bld: 5.5 % (ref 4.8–5.6)
Mean Plasma Glucose: 111 mg/dL

## 2022-04-19 LAB — BASIC METABOLIC PANEL
Anion gap: 12 (ref 5–15)
Anion gap: 9 (ref 5–15)
BUN: 19 mg/dL (ref 8–23)
BUN: 15 mg/dL (ref 8–23)
CO2: 22 mmol/L (ref 22–32)
CO2: 21 mmol/L — ABNORMAL LOW (ref 22–32)
Calcium: 8.8 mg/dL — ABNORMAL LOW (ref 8.9–10.3)
Calcium: 7.8 mg/dL — ABNORMAL LOW (ref 8.9–10.3)
Chloride: 105 mmol/L (ref 98–111)
Chloride: 112 mmol/L — ABNORMAL HIGH (ref 98–111)
Creatinine, Ser: 1.2 mg/dL (ref 0.61–1.24)
Creatinine, Ser: 0.9 mg/dL (ref 0.61–1.24)
GFR, Estimated: >60 mL/min (ref >60)
GFR, Estimated: >60 mL/min (ref >60)
Glucose, Bld: 94 mg/dL (ref 70–99)
Glucose, Bld: 142 mg/dL — ABNORMAL HIGH (ref 70–99)
Potassium: 4.2 mmol/L (ref 3.5–5.1)
Potassium: 4.2 mmol/L (ref 3.5–5.1)
Sodium: 139 mmol/L (ref 135–145)
Sodium: 142 mmol/L (ref 135–145)

## 2022-04-19 LAB — COOXEMETRY PANEL
Carboxyhemoglobin: 1.7 % — ABNORMAL HIGH (ref 0.5–1.5)
Carboxyhemoglobin: 2 % — ABNORMAL HIGH (ref 0.5–1.5)
Methemoglobin: 0.7 % (ref 0.0–1.5)
Methemoglobin: <0.7 % (ref 0.0–1.5)
O2 Saturation: 72.7 %
O2 Saturation: 58.4 %
Total hemoglobin: 7.4 g/dL — ABNORMAL LOW (ref 12.0–16.0)
Total hemoglobin: 7.7 g/dL — ABNORMAL LOW (ref 12.0–16.0)

## 2022-04-19 LAB — GLUCOSE, CAPILLARY
Glucose-Capillary: 116 mg/dL — ABNORMAL HIGH (ref 70–99)
Glucose-Capillary: 85 mg/dL (ref 70–99)
Glucose-Capillary: 92 mg/dL (ref 70–99)
Glucose-Capillary: 127 mg/dL — ABNORMAL HIGH (ref 70–99)

## 2022-04-19 LAB — PROTIME-INR
INR: 1.6 — ABNORMAL HIGH (ref 0.8–1.2)
INR: 1.4 — ABNORMAL HIGH (ref 0.8–1.2)
Prothrombin Time: 18.7 seconds — ABNORMAL HIGH (ref 11.4–15.2)
Prothrombin Time: 16.8 seconds — ABNORMAL HIGH (ref 11.4–15.2)

## 2022-04-19 LAB — APTT
aPTT: 37 seconds — ABNORMAL HIGH (ref 24–36)
aPTT: 39 seconds — ABNORMAL HIGH (ref 24–36)

## 2022-04-19 LAB — PLATELET COUNT
Platelets: 121 10*3/uL — ABNORMAL LOW (ref 150–400)
Platelets: 111 10*3/uL — ABNORMAL LOW (ref 150–400)

## 2022-04-19 LAB — FIBRINOGEN: Fibrinogen: 205 mg/dL — ABNORMAL LOW (ref 210–475)

## 2022-04-19 LAB — MAGNESIUM: Magnesium: 2.2 mg/dL (ref 1.7–2.4)

## 2022-04-19 LAB — HEMOGLOBIN AND HEMATOCRIT, BLOOD
HCT: 21.4 % — ABNORMAL LOW (ref 39.0–52.0)
HCT: 23.4 % — ABNORMAL LOW (ref 39.0–52.0)
Hemoglobin: 7.2 g/dL — ABNORMAL LOW (ref 13.0–17.0)
Hemoglobin: 7.8 g/dL — ABNORMAL LOW (ref 13.0–17.0)

## 2022-04-19 LAB — ECHO INTRAOPERATIVE TEE
Height: 66 in
Weight: 2827.2 oz

## 2022-04-19 LAB — HEPARIN LEVEL (UNFRACTIONATED): Heparin Unfractionated: 0.51 IU/mL (ref 0.30–0.70)

## 2022-04-19 SURGERY — CORONARY ARTERY BYPASS GRAFTING (CABG)
Anesthesia: General | Site: Chest

## 2022-04-19 MED ORDER — ACETAMINOPHEN 500 MG PO TABS
1000.0000 mg | ORAL_TABLET | Freq: Four times a day (QID) | ORAL | Status: AC
  Administered 2022-04-20 – 2022-04-24 (×18): 1000 mg via ORAL
  Filled 2022-04-19 (×16): qty 2

## 2022-04-19 MED ORDER — BISACODYL 5 MG PO TBEC
10.0000 mg | DELAYED_RELEASE_TABLET | Freq: Every day | ORAL | Status: DC
  Administered 2022-04-20 – 2022-04-30 (×9): 10 mg via ORAL
  Filled 2022-04-19 (×10): qty 2

## 2022-04-19 MED ORDER — HYDROCORTISONE PEDS INJ SYRINGE 50 MG/ML: 50.0000 mg | Freq: Three times a day (TID) | INTRAVENOUS | Status: DC

## 2022-04-19 MED ORDER — BUPIVACAINE LIPOSOME 1.3 % IJ SUSP
INTRAMUSCULAR | Status: DC | PRN
  Administered 2022-04-19: 50 mL

## 2022-04-19 MED ORDER — 0.9 % SODIUM CHLORIDE (POUR BTL) OPTIME
TOPICAL | Status: DC | PRN
  Administered 2022-04-19: 5000 mL

## 2022-04-19 MED ORDER — ACETAMINOPHEN 160 MG/5ML PO SOLN
1000.0000 mg | Freq: Four times a day (QID) | ORAL | Status: DC
  Administered 2022-04-20 (×2): 1000 mg
  Filled 2022-04-19 (×2): qty 40.6

## 2022-04-19 MED ORDER — HYDROCORTISONE SOD SUC (PF) 100 MG IJ SOLR
50.0000 mg | Freq: Three times a day (TID) | INTRAMUSCULAR | Status: AC
  Administered 2022-04-19 – 2022-04-20 (×3): 50 mg via INTRAVENOUS
  Filled 2022-04-19 (×3): qty 2

## 2022-04-19 MED ORDER — PROPOFOL 10 MG/ML IV BOLUS
INTRAVENOUS | Status: AC
  Filled 2022-04-19: qty 20

## 2022-04-19 MED ORDER — MORPHINE SULFATE (PF) 2 MG/ML IV SOLN
1.0000 mg | INTRAVENOUS | Status: DC | PRN
  Administered 2022-04-19 – 2022-04-20 (×3): 2 mg via INTRAVENOUS
  Filled 2022-04-19 (×4): qty 1

## 2022-04-19 MED ORDER — LIDOCAINE 2% (20 MG/ML) 5 ML SYRINGE
INTRAMUSCULAR | Status: AC
  Filled 2022-04-19: qty 5

## 2022-04-19 MED ORDER — ROCURONIUM BROMIDE 10 MG/ML (PF) SYRINGE
PREFILLED_SYRINGE | INTRAVENOUS | Status: AC
  Filled 2022-04-19: qty 10

## 2022-04-19 MED ORDER — CHLORHEXIDINE GLUCONATE CLOTH 2 % EX PADS
6.0000 | MEDICATED_PAD | Freq: Every day | CUTANEOUS | Status: DC
  Administered 2022-04-19 – 2022-04-29 (×11): 6 via TOPICAL

## 2022-04-19 MED ORDER — PROTAMINE SULFATE 10 MG/ML IV SOLN
INTRAVENOUS | Status: AC
  Filled 2022-04-19: qty 25

## 2022-04-19 MED ORDER — ORAL CARE MOUTH RINSE: 15.0000 mL | OROMUCOSAL | Status: DC | PRN

## 2022-04-19 MED ORDER — SODIUM CHLORIDE 0.9% FLUSH
3.0000 mL | Freq: Two times a day (BID) | INTRAVENOUS | Status: DC
  Administered 2022-04-20 – 2022-04-30 (×20): 3 mL via INTRAVENOUS

## 2022-04-19 MED ORDER — MIDAZOLAM HCL (PF) 5 MG/ML IJ SOLN
INTRAMUSCULAR | Status: DC | PRN
  Administered 2022-04-19 (×2): 1 mg via INTRAVENOUS
  Administered 2022-04-19: 3 mg via INTRAVENOUS
  Administered 2022-04-19: 2 mg via INTRAVENOUS
  Administered 2022-04-19: 3 mg via INTRAVENOUS

## 2022-04-19 MED ORDER — BUPIVACAINE HCL (PF) 0.5 % IJ SOLN
INTRAMUSCULAR | Status: AC
  Filled 2022-04-19: qty 30

## 2022-04-19 MED ORDER — CEFAZOLIN SODIUM-DEXTROSE 2-4 GM/100ML-% IV SOLN
2.0000 g | Freq: Three times a day (TID) | INTRAVENOUS | Status: AC
  Administered 2022-04-19 – 2022-04-21 (×6): 2 g via INTRAVENOUS
  Filled 2022-04-19 (×6): qty 100

## 2022-04-19 MED ORDER — TRAMADOL HCL 50 MG PO TABS
50.0000 mg | ORAL_TABLET | ORAL | Status: DC | PRN
  Administered 2022-04-20 – 2022-04-22 (×3): 100 mg via ORAL
  Administered 2022-04-23: 50 mg via ORAL
  Filled 2022-04-19 (×2): qty 2
  Filled 2022-04-19: qty 1
  Filled 2022-04-19: qty 2

## 2022-04-19 MED ORDER — HEPARIN SODIUM (PORCINE) 1000 UNIT/ML IJ SOLN
INTRAMUSCULAR | Status: AC
  Filled 2022-04-19: qty 1

## 2022-04-19 MED ORDER — HYDROCORTISONE SOD SUC (PF) 100 MG IJ SOLR
100.0000 mg | Freq: Once | INTRAMUSCULAR | Status: AC
  Administered 2022-04-19: 100 mg via INTRAVENOUS
  Filled 2022-04-19: qty 2

## 2022-04-19 MED ORDER — BISACODYL 10 MG RE SUPP: 10.0000 mg | Freq: Every day | RECTAL | Status: DC

## 2022-04-19 MED ORDER — ALBUMIN HUMAN 5 % IV SOLN
250.0000 mL | INTRAVENOUS | Status: AC | PRN
  Administered 2022-04-19 (×4): 12.5 g via INTRAVENOUS
  Filled 2022-04-19: qty 250

## 2022-04-19 MED ORDER — SODIUM CHLORIDE 0.9 % IV SOLN: INTRAVENOUS | Status: DC

## 2022-04-19 MED ORDER — INSULIN REGULAR(HUMAN) IN NACL 100-0.9 UT/100ML-% IV SOLN
INTRAVENOUS | Status: DC
  Administered 2022-04-19: 2.6 [IU]/h via INTRAVENOUS

## 2022-04-19 MED ORDER — FENTANYL CITRATE (PF) 250 MCG/5ML IJ SOLN
INTRAMUSCULAR | Status: AC
  Filled 2022-04-19: qty 5

## 2022-04-19 MED ORDER — ALBUMIN HUMAN 5 % IV SOLN: INTRAVENOUS | Status: DC | PRN

## 2022-04-19 MED ORDER — FENTANYL CITRATE (PF) 250 MCG/5ML IJ SOLN
INTRAMUSCULAR | Status: DC | PRN
  Administered 2022-04-19: 100 ug via INTRAVENOUS
  Administered 2022-04-19: 50 ug via INTRAVENOUS
  Administered 2022-04-19 (×6): 100 ug via INTRAVENOUS
  Administered 2022-04-19: 50 ug via INTRAVENOUS
  Administered 2022-04-19 (×3): 100 ug via INTRAVENOUS
  Administered 2022-04-19 (×3): 50 ug via INTRAVENOUS

## 2022-04-19 MED ORDER — OXYCODONE HCL 5 MG PO TABS
5.0000 mg | ORAL_TABLET | ORAL | Status: DC | PRN
  Administered 2022-04-19 – 2022-04-20 (×3): 5 mg via ORAL
  Administered 2022-04-21 (×2): 10 mg via ORAL
  Administered 2022-04-21 – 2022-04-23 (×3): 5 mg via ORAL
  Filled 2022-04-19 (×2): qty 1
  Filled 2022-04-19: qty 2
  Filled 2022-04-19: qty 1
  Filled 2022-04-19: qty 2
  Filled 2022-04-19 (×2): qty 1
  Filled 2022-04-19: qty 2

## 2022-04-19 MED ORDER — EPHEDRINE SULFATE-NACL 50-0.9 MG/10ML-% IV SOSY
PREFILLED_SYRINGE | INTRAVENOUS | Status: DC | PRN
  Administered 2022-04-19 (×2): 5 mg via INTRAVENOUS

## 2022-04-19 MED ORDER — PROPOFOL 10 MG/ML IV BOLUS
INTRAVENOUS | Status: DC | PRN
  Administered 2022-04-19: 40 mg via INTRAVENOUS

## 2022-04-19 MED ORDER — LACTATED RINGERS IV SOLN: INTRAVENOUS | Status: DC

## 2022-04-19 MED ORDER — SODIUM CHLORIDE 0.9 % IV SOLN: 250.0000 mL | INTRAVENOUS | Status: DC

## 2022-04-19 MED ORDER — DEXAMETHASONE SODIUM PHOSPHATE 10 MG/ML IJ SOLN
INTRAMUSCULAR | Status: AC
  Filled 2022-04-19: qty 1

## 2022-04-19 MED ORDER — ROCURONIUM BROMIDE 10 MG/ML (PF) SYRINGE
PREFILLED_SYRINGE | INTRAVENOUS | Status: DC | PRN
  Administered 2022-04-19: 30 mg via INTRAVENOUS
  Administered 2022-04-19: 10 mg via INTRAVENOUS
  Administered 2022-04-19: 90 mg via INTRAVENOUS

## 2022-04-19 MED ORDER — EPINEPHRINE HCL 5 MG/250ML IV SOLN IN NS
0.0000 ug/min | INTRAVENOUS | Status: DC
  Administered 2022-04-20: 3 ug/min via INTRAVENOUS
  Filled 2022-04-19: qty 250

## 2022-04-19 MED ORDER — CLOPIDOGREL BISULFATE 75 MG PO TABS
75.0000 mg | ORAL_TABLET | Freq: Once | ORAL | Status: AC
  Administered 2022-04-19: 75 mg via ORAL
  Filled 2022-04-19: qty 1

## 2022-04-19 MED ORDER — ONDANSETRON HCL 4 MG/2ML IJ SOLN
INTRAMUSCULAR | Status: AC
  Filled 2022-04-19: qty 2

## 2022-04-19 MED ORDER — SODIUM CHLORIDE 0.9 % IV SOLN: INTRAVENOUS | Status: DC | PRN

## 2022-04-19 MED ORDER — DOCUSATE SODIUM 100 MG PO CAPS
200.0000 mg | ORAL_CAPSULE | Freq: Every day | ORAL | Status: DC
  Administered 2022-04-20 – 2022-04-30 (×10): 200 mg via ORAL
  Filled 2022-04-19 (×10): qty 2

## 2022-04-19 MED ORDER — GLYCOPYRROLATE 0.2 MG/ML IJ SOLN
INTRAMUSCULAR | Status: DC | PRN
  Administered 2022-04-19: .2 mg via INTRAVENOUS

## 2022-04-19 MED ORDER — SODIUM CHLORIDE 0.9% IV SOLUTION: Freq: Once | INTRAVENOUS | Status: DC

## 2022-04-19 MED ORDER — DEXMEDETOMIDINE HCL IN NACL 400 MCG/100ML IV SOLN
0.0000 ug/kg/h | INTRAVENOUS | Status: DC
  Administered 2022-04-19: 0.7 ug/kg/h via INTRAVENOUS
  Filled 2022-04-19: qty 100

## 2022-04-19 MED ORDER — PANTOPRAZOLE SODIUM 40 MG PO TBEC
40.0000 mg | DELAYED_RELEASE_TABLET | Freq: Every day | ORAL | Status: DC
  Administered 2022-04-21 – 2022-04-30 (×10): 40 mg via ORAL
  Filled 2022-04-19 (×10): qty 1

## 2022-04-19 MED ORDER — PLASMA-LYTE A IV SOLN
INTRAVENOUS | Status: DC | PRN
  Administered 2022-04-19: 500 mL

## 2022-04-19 MED ORDER — PHENYLEPHRINE HCL-NACL 20-0.9 MG/250ML-% IV SOLN: 0.0000 ug/min | INTRAVENOUS | Status: DC

## 2022-04-19 MED ORDER — AMIODARONE HCL IN DEXTROSE 360-4.14 MG/200ML-% IV SOLN
30.0000 mg/h | INTRAVENOUS | Status: DC
  Filled 2022-04-19: qty 200

## 2022-04-19 MED ORDER — AMIODARONE HCL IN DEXTROSE 360-4.14 MG/200ML-% IV SOLN
60.0000 mg/h | INTRAVENOUS | Status: DC
  Filled 2022-04-19: qty 200

## 2022-04-19 MED ORDER — ASPIRIN 325 MG PO TBEC: 325.0000 mg | DELAYED_RELEASE_TABLET | Freq: Every day | ORAL | Status: DC

## 2022-04-19 MED ORDER — HEPARIN SODIUM (PORCINE) 1000 UNIT/ML IJ SOLN
INTRAMUSCULAR | Status: DC | PRN
  Administered 2022-04-19: 21000 [IU] via INTRAVENOUS
  Administered 2022-04-19: 5000 [IU] via INTRAVENOUS

## 2022-04-19 MED ORDER — SODIUM CHLORIDE 0.9% FLUSH: 3.0000 mL | INTRAVENOUS | Status: DC | PRN

## 2022-04-19 MED ORDER — ACETAMINOPHEN 160 MG/5ML PO SOLN: 650.0000 mg | Freq: Once | ORAL | Status: AC

## 2022-04-19 MED ORDER — NOREPINEPHRINE 4 MG/250ML-% IV SOLN: 0.0000 ug/min | INTRAVENOUS | Status: DC

## 2022-04-19 MED ORDER — CHLORHEXIDINE GLUCONATE 0.12 % MT SOLN
15.0000 mL | OROMUCOSAL | Status: AC
  Administered 2022-04-19: 15 mL via OROMUCOSAL
  Filled 2022-04-19: qty 15

## 2022-04-19 MED ORDER — BUPIVACAINE LIPOSOME 1.3 % IJ SUSP
INTRAMUSCULAR | Status: AC
  Filled 2022-04-19: qty 20

## 2022-04-19 MED ORDER — CLOPIDOGREL BISULFATE 75 MG PO TABS: 75.0000 mg | ORAL_TABLET | Freq: Every day | ORAL | Status: DC

## 2022-04-19 MED ORDER — POTASSIUM CHLORIDE 10 MEQ/50ML IV SOLN: 10.0000 meq | INTRAVENOUS | Status: AC

## 2022-04-19 MED ORDER — LACTATED RINGERS IV SOLN: INTRAVENOUS | Status: DC | PRN

## 2022-04-19 MED ORDER — ONDANSETRON HCL 4 MG/2ML IJ SOLN
4.0000 mg | Freq: Four times a day (QID) | INTRAMUSCULAR | Status: DC | PRN
  Administered 2022-04-20 – 2022-04-25 (×6): 4 mg via INTRAVENOUS
  Filled 2022-04-19 (×6): qty 2

## 2022-04-19 MED ORDER — FAMOTIDINE IN NACL 20-0.9 MG/50ML-% IV SOLN
20.0000 mg | Freq: Two times a day (BID) | INTRAVENOUS | Status: AC
  Administered 2022-04-19 – 2022-04-20 (×2): 20 mg via INTRAVENOUS
  Filled 2022-04-19 (×2): qty 50

## 2022-04-19 MED ORDER — DEXTROSE 50 % IV SOLN: 0.0000 mL | INTRAVENOUS | Status: DC | PRN

## 2022-04-19 MED ORDER — HEMOSTATIC AGENTS (NO CHARGE) OPTIME
TOPICAL | Status: DC | PRN
  Administered 2022-04-19 (×2): 1 via TOPICAL

## 2022-04-19 MED ORDER — MIDAZOLAM HCL (PF) 10 MG/2ML IJ SOLN
INTRAMUSCULAR | Status: AC
  Filled 2022-04-19: qty 2

## 2022-04-19 MED ORDER — NITROGLYCERIN IN D5W 200-5 MCG/ML-% IV SOLN
0.0000 ug/min | INTRAVENOUS | Status: DC
  Administered 2022-04-19: 5 ug/min via INTRAVENOUS

## 2022-04-19 MED ORDER — ASPIRIN 81 MG PO CHEW
324.0000 mg | CHEWABLE_TABLET | Freq: Every day | ORAL | Status: DC
  Administered 2022-04-20: 324 mg
  Filled 2022-04-19: qty 4

## 2022-04-19 MED ORDER — PROTAMINE SULFATE 10 MG/ML IV SOLN
INTRAVENOUS | Status: DC | PRN
  Administered 2022-04-19: 260 mg via INTRAVENOUS

## 2022-04-19 MED ORDER — INSULIN ASPART 100 UNIT/ML IJ SOLN
0.0000 [IU] | INTRAMUSCULAR | Status: DC
  Administered 2022-04-19: 2 [IU] via SUBCUTANEOUS
  Administered 2022-04-20 (×4): 4 [IU] via SUBCUTANEOUS

## 2022-04-19 MED ORDER — SODIUM CHLORIDE 0.45 % IV SOLN: INTRAVENOUS | Status: DC | PRN

## 2022-04-19 MED ORDER — ARTIFICIAL TEARS OPHTHALMIC OINT
TOPICAL_OINTMENT | OPHTHALMIC | Status: DC | PRN
  Administered 2022-04-19: 1 via OPHTHALMIC

## 2022-04-19 MED ORDER — LACTATED RINGERS IV SOLN
500.0000 mL | Freq: Once | INTRAVENOUS | Status: AC | PRN
  Administered 2022-04-19: 500 mL via INTRAVENOUS

## 2022-04-19 MED ORDER — ORAL CARE MOUTH RINSE
15.0000 mL | OROMUCOSAL | Status: DC
  Administered 2022-04-19 – 2022-04-20 (×10): 15 mL via OROMUCOSAL

## 2022-04-19 MED ORDER — VANCOMYCIN HCL 1000 MG IV SOLR
INTRAVENOUS | Status: DC | PRN
  Administered 2022-04-19: 1000 mL

## 2022-04-19 MED ORDER — ACETAMINOPHEN 650 MG RE SUPP
650.0000 mg | Freq: Once | RECTAL | Status: AC
  Administered 2022-04-19: 650 mg via RECTAL

## 2022-04-19 MED ORDER — HEMOSTATIC AGENTS (NO CHARGE) OPTIME
TOPICAL | Status: DC | PRN
  Administered 2022-04-19 (×4): 1 via TOPICAL

## 2022-04-19 MED ORDER — MIDAZOLAM HCL 2 MG/2ML IJ SOLN
2.0000 mg | INTRAMUSCULAR | Status: DC | PRN
  Administered 2022-04-19 – 2022-04-20 (×2): 2 mg via INTRAVENOUS
  Filled 2022-04-19 (×2): qty 2

## 2022-04-19 MED ORDER — PHENYLEPHRINE 80 MCG/ML (10ML) SYRINGE FOR IV PUSH (FOR BLOOD PRESSURE SUPPORT)
PREFILLED_SYRINGE | INTRAVENOUS | Status: DC | PRN
  Administered 2022-04-19: 80 ug via INTRAVENOUS
  Administered 2022-04-19: 40 ug via INTRAVENOUS

## 2022-04-19 MED ORDER — SODIUM CHLORIDE (PF) 0.9 % IJ SOLN
INTRAMUSCULAR | Status: AC
  Filled 2022-04-19: qty 10

## 2022-04-19 SURGICAL SUPPLY — 124 items
ADAPTER MULTI PERFUSION 15 (ADAPTER) ×3 IMPLANT
ADH SKN CLS APL DERMABOND .7 (GAUZE/BANDAGES/DRESSINGS) ×3
ANTIFOG SOL W/FOAM PAD STRL (MISCELLANEOUS) ×3
APPLIER CLIP 11 MED OPEN (CLIP) ×6
APPLIER CLIP 9.375 SM OPEN (CLIP) ×18
APR CLP MED 11 20 MLT OPN (CLIP) ×6
APR CLP SM 9.3 20 MLT OPN (CLIP) ×18
ATRICLIP EXCLUSION VLAA SYSTEM (Miscellaneous) ×3 IMPLANT
BAG DECANTER FOR FLEXI CONT (MISCELLANEOUS) ×3 IMPLANT
BLADE CLIPPER SURG (BLADE) IMPLANT
BLADE MINI 60D BLUE (BLADE) ×3 IMPLANT
BLADE STERNUM SYSTEM 6 (BLADE) ×3 IMPLANT
BLADE SURG 11 STRL SS (BLADE) IMPLANT
BLOWER MISTER CAL-MED (MISCELLANEOUS) ×3 IMPLANT
BNDG ELASTIC 4X5.8 VLCR STR LF (GAUZE/BANDAGES/DRESSINGS) ×3 IMPLANT
BNDG ELASTIC 6X5.8 VLCR STR LF (GAUZE/BANDAGES/DRESSINGS) ×3 IMPLANT
BNDG GAUZE DERMACEA FLUFF 4 (GAUZE/BANDAGES/DRESSINGS) ×3 IMPLANT
BNDG GZE DERMACEA 4 6PLY (GAUZE/BANDAGES/DRESSINGS) ×3
BULB SUCTION JP 400 (SUCTIONS) ×3 IMPLANT
CANISTER SUCT 3000ML PPV (MISCELLANEOUS) ×3 IMPLANT
CANNULA AORTIC ROOT 9FR (CANNULA) ×3 IMPLANT
CANNULA MC2 2 STG 29/37 NON-V (CANNULA) ×3 IMPLANT
CANNULA MC2 TWO STAGE (CANNULA) ×3
CANNULA VESSEL 3MM 2 BLNT TIP (CANNULA) ×3 IMPLANT
CATH ROBINSON RED A/P 18FR (CATHETERS) ×9 IMPLANT
CLIP APPLIE 11 MED OPEN (CLIP) ×3 IMPLANT
CLIP APPLIE 9.375 SM OPEN (CLIP) ×6 IMPLANT
CLIP TI LARGE 6 (CLIP) IMPLANT
CLIP TI MEDIUM 24 (CLIP) IMPLANT
CLIP TI WIDE RED SMALL 24 (CLIP) IMPLANT
CNTNR URN SCR LID CUP LEK RST (MISCELLANEOUS) IMPLANT
CONN Y 3/8X3/8X3/8  BEN (MISCELLANEOUS) ×3
CONN Y 3/8X3/8X3/8 BEN (MISCELLANEOUS) IMPLANT
CONNECTOR BLAKE 2:1 CARIO BLK (MISCELLANEOUS) IMPLANT
CONT SPEC 4OZ STRL OR WHT (MISCELLANEOUS) ×6
DERMABOND ADVANCED .7 DNX12 (GAUZE/BANDAGES/DRESSINGS) ×6 IMPLANT
DRAIN CHANNEL 24FR (DRAIN) ×6 IMPLANT
DRAIN CONNECTOR BLAKE 1:1 (MISCELLANEOUS) IMPLANT
DRAIN JACKSON PRATT 10MM FLAT (MISCELLANEOUS) ×3 IMPLANT
DRAPE CARDIOVASCULAR INCISE (DRAPES) ×3
DRAPE SRG 135X102X78XABS (DRAPES) ×3 IMPLANT
DRSG AQUACEL AG ADV 3.5X14 (GAUZE/BANDAGES/DRESSINGS) ×3 IMPLANT
ELECT REM PT RETURN 9FT ADLT (ELECTROSURGICAL) ×6
ELECTRODE REM PT RTRN 9FT ADLT (ELECTROSURGICAL) ×6 IMPLANT
FELT TEFLON 1X6 (MISCELLANEOUS) ×3 IMPLANT
GAUZE 4X4 16PLY ~~LOC~~+RFID DBL (SPONGE) IMPLANT
GAUZE SPONGE 4X4 12PLY STRL (GAUZE/BANDAGES/DRESSINGS) ×6 IMPLANT
GLOVE BIO SURGEON STRL SZ 6 (GLOVE) IMPLANT
GLOVE BIO SURGEON STRL SZ 6.5 (GLOVE) IMPLANT
GLOVE BIO SURGEON STRL SZ8 (GLOVE) IMPLANT
GLOVE BIOGEL M 8.0 STRL (GLOVE) ×9 IMPLANT
GLOVE BIOGEL PI IND STRL 6.5 (GLOVE) IMPLANT
GLOVE BIOGEL PI IND STRL 7.0 (GLOVE) IMPLANT
GLOVE BIOGEL PI IND STRL 7.5 (GLOVE) IMPLANT
GLOVE BIOGEL PI IND STRL 9 (GLOVE) IMPLANT
GLOVE SS BIOGEL STRL SZ 6 (GLOVE) IMPLANT
GLOVE SURG SS PI 6.0 STRL IVOR (GLOVE) IMPLANT
GLOVE SURG SS PI 7.0 STRL IVOR (GLOVE) IMPLANT
GOWN STRL REUS W/ TWL LRG LVL3 (GOWN DISPOSABLE) ×9 IMPLANT
GOWN STRL REUS W/ TWL XL LVL3 (GOWN DISPOSABLE) ×9 IMPLANT
GOWN STRL REUS W/TWL LRG LVL3 (GOWN DISPOSABLE) ×27
GOWN STRL REUS W/TWL XL LVL3 (GOWN DISPOSABLE) ×15
HEMOSTAT SURGICEL 2X14 (HEMOSTASIS) IMPLANT
HEMOSTAT SURGICEL NU-KNIT 6X9 (HEMOSTASIS) ×3 IMPLANT
INSERT FOGARTY XLG (MISCELLANEOUS) ×3 IMPLANT
KIT BASIN OR (CUSTOM PROCEDURE TRAY) ×3 IMPLANT
KIT TURNOVER KIT B (KITS) ×3 IMPLANT
KIT VASOVIEW HEMOPRO 2 VH 4000 (KITS) ×3 IMPLANT
KNIFE MICRO-UNI 3.5 30 DEG (BLADE) ×3 IMPLANT
LEAD PACING MYOCARDI (MISCELLANEOUS) ×3 IMPLANT
MARKER GRAFT CORONARY BYPASS (MISCELLANEOUS) IMPLANT
NS IRRIG 1000ML POUR BTL (IV SOLUTION) ×15 IMPLANT
OFFPUMP STABILIZER SUV (MISCELLANEOUS) ×3 IMPLANT
PACK E OPEN HEART (SUTURE) ×3 IMPLANT
PACK OPEN HEART (CUSTOM PROCEDURE TRAY) ×3 IMPLANT
PAD ARMBOARD 7.5X6 YLW CONV (MISCELLANEOUS) ×6 IMPLANT
PAD ELECT DEFIB RADIOL ZOLL (MISCELLANEOUS) ×3 IMPLANT
POSITIONER ACROBAT-I OFFPUMP (MISCELLANEOUS) IMPLANT
POSITIONER HEAD DONUT 9IN (MISCELLANEOUS) ×3 IMPLANT
POWDER SURGICEL 3.0 GRAM (HEMOSTASIS) IMPLANT
PROBE INTRAVASCULAR 45 (VASCULAR PRODUCTS) ×3 IMPLANT
PUNCH AORTIC ROTATE 4.0MM (MISCELLANEOUS) IMPLANT
SEALANT PATCH FIBRIN 2X4IN (MISCELLANEOUS) IMPLANT
SET MPS 3-ND DEL (MISCELLANEOUS) IMPLANT
SOL PREP POV-IOD 4OZ 10% (MISCELLANEOUS) IMPLANT
SOL SCRUB PVP POV-IOD 4OZ 7.5% (MISCELLANEOUS) ×6
SOLUTION ANTFG W/FOAM PAD STRL (MISCELLANEOUS) IMPLANT
SOLUTION SCRB POV-IOD 4OZ 7.5% (MISCELLANEOUS) IMPLANT
SPONGE T-LAP 18X18 ~~LOC~~+RFID (SPONGE) IMPLANT
SUT BONE WAX W31G (SUTURE) ×3 IMPLANT
SUT ETHIBOND 2 0 SH (SUTURE) ×3
SUT ETHIBOND 2 0 SH 36X2 (SUTURE) IMPLANT
SUT ETHILON 3 0 FSL (SUTURE) IMPLANT
SUT MNCRL AB 3-0 PS2 27 (SUTURE) ×6 IMPLANT
SUT PROLENE 4 0 RB 1 (SUTURE) ×12
SUT PROLENE 4 0 SH DA (SUTURE) ×6 IMPLANT
SUT PROLENE 4-0 RB1 .5 CRCL 36 (SUTURE) ×6 IMPLANT
SUT PROLENE 5 0 C 1 36 (SUTURE) IMPLANT
SUT PROLENE 6 0 C 1 30 (SUTURE) IMPLANT
SUT PROLENE 7 0 BV1 MDA (SUTURE) IMPLANT
SUT SILK  1 MH (SUTURE)
SUT SILK 1 MH (SUTURE) ×18 IMPLANT
SUT SILK 2 0 SH (SUTURE) IMPLANT
SUT STEEL 6MS V (SUTURE) IMPLANT
SUT STEEL STERNAL CCS#1 18IN (SUTURE) ×6 IMPLANT
SUT STEEL SZ 6 DBL 3X14 BALL (SUTURE) IMPLANT
SUT VIC AB 1 CTX 27 (SUTURE) IMPLANT
SUT VIC AB 1 CTX 36 (SUTURE) ×6
SUT VIC AB 1 CTX36XBRD ANBCTR (SUTURE) IMPLANT
SUT VIC AB 2-0 CT1 27 (SUTURE) ×3
SUT VIC AB 2-0 CT1 TAPERPNT 27 (SUTURE) IMPLANT
SUT VIC AB 2-0 CTX 27 (SUTURE) ×6 IMPLANT
SYR 20CC LL (SYRINGE) IMPLANT
SYSTEM EXCLUSION ATRICLIP VLAA (Miscellaneous) IMPLANT
SYSTEM SAHARA CHEST DRAIN ATS (WOUND CARE) ×6 IMPLANT
TAPE CLOTH SURG 4X10 WHT LF (GAUZE/BANDAGES/DRESSINGS) IMPLANT
TAPE PAPER 2X10 WHT MICROPORE (GAUZE/BANDAGES/DRESSINGS) IMPLANT
TOWEL GREEN STERILE (TOWEL DISPOSABLE) ×3 IMPLANT
TOWEL GREEN STERILE FF (TOWEL DISPOSABLE) ×3 IMPLANT
TRAY FOLEY SLVR 14FR TEMP STAT (SET/KITS/TRAYS/PACK) IMPLANT
TUBE SUCT INTRACARD DLP 20F (MISCELLANEOUS) ×3 IMPLANT
TUBING LAP HI FLOW INSUFFLATIO (TUBING) ×3 IMPLANT
UNDERPAD 30X36 HEAVY ABSORB (UNDERPADS AND DIAPERS) ×3 IMPLANT
WATER STERILE IRR 1000ML POUR (IV SOLUTION) ×6 IMPLANT

## 2022-04-19 NOTE — Progress Notes (Signed)
Patient seen  Plan for cab today.   Myasthenia - anesthetic plan clarified

## 2022-04-19 NOTE — Anesthesia Postprocedure Evaluation (Signed)
Anesthesia Post Note  Patient: Adrian Bell  Procedure(s) Performed: CORONARY ARTERY BYPASS GRAFTING (CABG) X3 USING LEFT INTERNAL MAMMARY ARTERY (LIMA) AND ENDOSCOPICALLY HARVESTED RIGHT GREATER SAPHENOUS VEIN AND CORONARY ENDARTERECTOMY (Chest) TRANSESOPHAGEAL ECHOCARDIOGRAM THYMECTOMY (Chest) CLIPPING OF ATRIAL APPENDAGE USING ATRICURE 40MM ATRICLIP (Chest)     Patient location during evaluation: ICU Anesthesia Type: General Level of consciousness: sedated and patient remains intubated per anesthesia plan Pain management: pain level controlled Vital Signs Assessment: post-procedure vital signs reviewed and stable Respiratory status: patient remains intubated per anesthesia plan Cardiovascular status: stable Postop Assessment: no apparent nausea or vomiting Anesthetic complications: no   No notable events documented.  Last Vitals:  Vitals:   04/19/22 0300 04/19/22 1538  BP: (!) 144/72 (!) 98/53  Pulse:    Resp: 15   Temp: 36.5 C   SpO2: 97%     Last Pain:  Vitals:   04/19/22 0347  TempSrc:   PainSc: 0-No pain                 Audry Pili

## 2022-04-19 NOTE — Transfer of Care (Signed)
Immediate Anesthesia Transfer of Care Note  Patient: Rodert Kennell  Procedure(s) Performed: CORONARY ARTERY BYPASS GRAFTING (CABG) X3 USING LEFT INTERNAL MAMMARY ARTERY (LIMA) AND ENDOSCOPICALLY HARVESTED RIGHT GREATER SAPHENOUS VEIN AND CORONARY ENDARTERECTOMY (Chest) TRANSESOPHAGEAL ECHOCARDIOGRAM THYMECTOMY (Chest) CLIPPING OF ATRIAL APPENDAGE USING ATRICURE 40MM ATRICLIP (Chest)  Patient Location: PACU  Anesthesia Type:General  Level of Consciousness: sedated and Patient remains intubated per anesthesia plan  Airway & Oxygen Therapy: Patient remains intubated per anesthesia plan and Patient placed on Ventilator (see vital sign flow sheet for setting)  Post-op Assessment: Report given to RN and Post -op Vital signs reviewed and stable  Post vital signs: Reviewed and stable  Last Vitals:  Vitals Value Taken Time  BP 103/71 04/19/22 1537  Temp    Pulse 71 04/19/22 1541  Resp 18 04/19/22 1541  SpO2 99 % 04/19/22 1541  Vitals shown include unvalidated device data.  Last Pain:  Vitals:   04/19/22 0347  TempSrc:   PainSc: 0-No pain         Complications: No notable events documented.

## 2022-04-19 NOTE — Progress Notes (Signed)
  Echocardiogram Echocardiogram Transesophageal has been performed.  Adrian Bell 04/19/2022, 8:23 AM

## 2022-04-19 NOTE — Brief Op Note (Signed)
04/15/2022 - 04/19/2022  9:08 AM  PATIENT:  Adrian Bell  76 y.o. male  PRE-OPERATIVE DIAGNOSIS:  Coronary Artery Disease  POST-OPERATIVE DIAGNOSIS:  Coronary Artery Disease  PROCEDURE:  Procedure(s):  CORONARY ARTERY BYPASS GRAFTING x 3 -LIMA to LAD -SVG to OM with Endarterectomy -SVG to DISTAL RCA  TRANSESOPHAGEAL ECHOCARDIOGRAM (N/A)  THYMECTOMY (N/A)  CLIPPING OF ATRIAL APPENDAGE USING ATRICURE 40MM ATRICLIP  Vein harvest time: 34  min Vein prep time: 10 min  SURGEON:  Surgeon(s) and Role:    * Enter, Pierre Bali, MD - Primary  PHYSICIAN ASSISTANT: Ellwood Handler PA-C   ASSISTANTS: Alcide Evener RNFA    ANESTHESIA:   general  EBL:  932 mL   BLOOD ADMINISTERED: 2 units CC PRBC,   CC CELLSAVER, 2 FFP, and 2 PLTS  DRAINS:  Right and Left Pleural Chest Tubes, Mediastinal Chest Drains    LOCAL MEDICATIONS USED:  BUPIVICAINE   SPECIMEN:  Source of Specimen:  Thymus  DISPOSITION OF SPECIMEN:  PATHOLOGY  COUNTS:  YES  TOURNIQUET:  * No tourniquets in log *  DICTATION: .Dragon Dictation  PLAN OF CARE: Admit to inpatient   PATIENT DISPOSITION:  ICU - intubated and hemodynamically stable.   Delay start of Pharmacological VTE agent (>24hrs) due to surgical blood loss or risk of bleeding: yes

## 2022-04-19 NOTE — Anesthesia Procedure Notes (Signed)
Procedure Name: Intubation Date/Time: 04/19/2022 8:00 AM  Performed by: Leonor Liv, CRNAPre-anesthesia Checklist: Patient identified, Emergency Drugs available, Suction available and Patient being monitored Patient Re-evaluated:Patient Re-evaluated prior to induction Oxygen Delivery Method: Circle System Utilized Preoxygenation: Pre-oxygenation with 100% oxygen Induction Type: IV induction Ventilation: Mask ventilation without difficulty Laryngoscope Size: Glidescope and 4 Grade View: Grade I Tube type: Oral Number of attempts: 1 Airway Equipment and Method: Stylet and Oral airway Placement Confirmation: ETT inserted through vocal cords under direct vision, positive ETCO2 and breath sounds checked- equal and bilateral Secured at: 22 cm Tube secured with: Tape Dental Injury: Teeth and Oropharynx as per pre-operative assessment

## 2022-04-19 NOTE — Anesthesia Procedure Notes (Signed)
Central Venous Catheter Insertion Performed by: Audry Pili, MD, anesthesiologist Start/End2/26/2024 6:59 AM, 04/19/2022 7:12 AM Patient location: Pre-op. Preanesthetic checklist: patient identified, IV checked, risks and benefits discussed, surgical consent, monitors and equipment checked, pre-op evaluation, timeout performed and anesthesia consent Position: Trendelenburg Lidocaine 1% used for infiltration and patient sedated Hand hygiene performed , maximum sterile barriers used  and Seldinger technique used Catheter size: 8.5 Fr Central line was placed.MAC introducer Procedure performed using ultrasound guided technique. Ultrasound Notes:anatomy identified, needle tip was noted to be adjacent to the nerve/plexus identified, no ultrasound evidence of intravascular and/or intraneural injection and image(s) printed for medical record Attempts: 1 Following insertion, line sutured, dressing applied and Biopatch. Post procedure assessment: blood return through all ports, no air and free fluid flow  Patient tolerated the procedure well with no immediate complications.

## 2022-04-19 NOTE — Consult Note (Signed)
NAME:  Braylin Dewater, MRN:  [id number], DOB:  1945-11-04, LOS: 4 ADMISSION DATE:  04/15/2022, CONSULTATION DATE:  2/26 REFERRING MD:  Tenny Craw, CHIEF COMPLAINT:  post-CABG   History of Present Illness:  Mr. Sule is a 77 y/o gentleman with a history of CAD s/p previous PCI who presented to [facility] from [place] with an NSTEMI. He presented with left-sided chest pain that was worse than his typical intermittent left chest pain, requiring 3 SL NTG to relieve his pain. He recently has endorsed exertional dyspnea as well. He underwent LHC upon transfer to [facility] and was found to have severe 3-vessel disease. He was evaluated by Cardiothoracic surgery for consideration of bypass surgery. At baseline he is active and independent. He has been managed with his baseline cardiac medications and heparin until today. EF is preserved. Cardiac risk factors include previous use of smokeless tobacco, HTN, HLD, age, gender. He underwent CABG x 3, LCX end-arterectomy, LAA clipping, and empiric thymectomy given history of MG.  Post-operatively he is on epinephrine. Last dose of paralytics was noon today; before leaving the OR he had only 1/4 twitches. Total rocuronium dose today '130mg'$ .   He has a history of MG which was diagnosed in 2018. His symptoms include diplopia, dysarthria, dysphagia. His maintenance regimen for MG is porednisone '10mg'$  daily with 1 previous flare since diagnosis. He has had an adverse reaction to IVIG in the past and was discontinued from this maintenance regimen in 12/2021.   Pertinent  Medical History  CAD HTN HLD MG on prednisone '10mg'$  daily nephrolithiasis  Significant Hospital Events: Including procedures, antibiotic start and stop dates in addition to other pertinent events   2/23 admission, LHC 2/26 CABG x 3, Lcx end-arterectomy, LAA clip, thymectomy  Interim History / Subjective:    Objective   Blood pressure (!) 144/72, pulse 68, temperature 97.7 F (36.5 C), temperature source Oral, resp. rate  15, height '5\' 6"'$  (1.676 m), weight 80.2 kg, SpO2 97 %.        Intake/Output Summary (Last 24 hours) at 04/19/2022 1435 Last data filed at 04/19/2022 1434 Gross per 24 hour  Intake 3067.47 ml  Output 4025 ml  Net -957.53 ml   Filed Weights   04/15/22 2207 04/16/22 0300 04/18/22 1010  Weight: 79.5 kg 79.5 kg 80.2 kg    Examination: General: critically ill appearing man lying in bed in NAD HENT: [place]/AT, eyes anicteric Lungs: rhonchi bilaterally, synchronous on MV Cardiovascular: S1S2, RRR Abdomen: soft, NT Extremities: no c/c/e, RLE wrapped Neuro: RASS -5 GU: clear yellow urine from foley  CXR personally reviewed>  ETT about 4 cm above carina. Chest tubes in place, no infiltrates. EKG: sinus brady, RBBB, normal axis. ST depression I, AVL, V2-3. Similar to pre-op EKG.  H/H 9.1/28.2 Platelets 148   Resolved Hospital Problem list     Assessment & Plan:  3-vessel CAD with NSTEMI, s/p CABG x 3, end-arterectomy of LCX, LAA clipping, thymectomy -complete post-op antibiotics -pain control per protocol -aspirin -start plavix today per Dr. Tenny Craw given need for end-arterectomy -defer starting statin due to MG risk -Post-op antibiotics-- can keep preferred cefazolin & vanc regimen. Reviewed with pharmD, and on multiple online resources. NEED to AVOID QUINOLONES, MACROLIDES, AMINOGLYCOSIDES. -Metoprolol once off vasopressors; need to ensure this does not cause an MG flare. He tolerated pre-op.  -Avoid empiric magnesium repletion   MG, no history of respiratory muscle weakness, now s/p thymectomy. No known history of thymoma. -avoid IVIG per Neuro; if he flares, he will  require PLEX -appreciate Neurology's management -avoid higher dose steroids above stress dose steroid dosing if needed -monitor for early signs of progressive NM weakness- dysarthria and dysphagia, diplopia. -avoid IV magnesium repletion  At risk for adrenal insufficiency due to chronic steroid use -can use atypical  regimen per Neuro if needed:  stress dose steroids such as hydrocortisone 100 mg followed by 50 mg q8hr (equivalent to prednisone 25 mg followed by 12.5 mg q8hr); would avoid excessively high doses of steroids equivalent to >100 mg prednisone daily due to risk of causing exacerbation with high-dose steroids. Received first dose in the OR; can hold off on additional doses unless he is refractory coming off pressors.   Post-op vent management -Not a candidate for rapid wean protocol due to baseline NM disease; needs close monitoring after extubation for non-traditional signs to suggest he is failing.  Anticipate he will require intubation overnight and should have a traditional SBT tomorrow morning.  -Assess NIF prior to extubation & q4h post-extubation. He is above average risk of requiring reintubation. -con't monitoring TOF; when 4/4, can start checking Q4h   Hyperglycemia -insulin gtt per protocol  Post-op anemia, expected Consumptive thrombocytopenia, expected -transfuse for Hb <7 or hemodynamically significant bleeding    Best Practice (right click and "Reselect all SmartList Selections" daily)   Diet/type: NPO DVT prophylaxis: SCD GI prophylaxis: H2B and PPI Lines: Central line and Arterial Line Foley:  Yes, and it is still needed Code Status:  full code Last date of multidisciplinary goals of care discussion [ per primary ]  Labs   CBC: Recent Labs  Lab 04/16/22 0039 04/17/22 0030 04/18/22 0022 04/19/22 0558 04/19/22 0816 04/19/22 1245 04/19/22 1251 04/19/22 1326 04/19/22 1346 04/19/22 1351  WBC 7.9 5.9 6.5 6.7  --   --   --   --   --   --   HGB 10.3* 10.0* 9.9* 10.6*   < > 7.2* 7.8* 8.5* 7.8* 7.8*  HCT 31.3* 31.6* 29.6* 32.3*   < > 21.4* 23.0* 25.0* 23.0* 23.4*  MCV 95.7 97.2 94.3 94.4  --   --   --   --   --   --   PLT 257 238 234 270  --  121*  --   --   --  111*   < > = values in this interval not displayed.    Basic Metabolic Panel: Recent Labs  Lab  04/16/22 0655 04/18/22 1141 04/19/22 0558 04/19/22 0816 04/19/22 1053 04/19/22 1153 04/19/22 1201 04/19/22 1251 04/19/22 1326 04/19/22 1346  NA 142 139 139   < > 140 139 141 143 142 143  K 4.0 4.1 4.2   < > 4.9 7.1* 5.1 4.7 4.9 4.3  CL 108 106 105   < > 107 105 109 108  --  109  CO2 '22 23 22  '$ --   --   --   --   --   --   --   GLUCOSE 94 125* 94   < > 132* 222* 155* 139*  --  155*  BUN '17 18 19   '$ < > '17 16 17 17  '$ --  16  CREATININE 1.07 1.09 1.20   < > 0.80 0.70 0.70 0.70  --  0.60*  CALCIUM 8.7* 9.2 8.8*  --   --   --   --   --   --   --    < > = values in this interval not displayed.   GFR: Estimated  Creatinine Clearance: 78.2 mL/min (A) (by C-G formula based on SCr of 0.6 mg/dL (L)). Recent Labs  Lab 04/16/22 0039 04/17/22 0030 04/18/22 0022 04/19/22 0558  WBC 7.9 5.9 6.5 6.7    Liver Function Tests: Recent Labs  Lab 04/18/22 1141  AST 27  ALT 14  ALKPHOS 54  BILITOT 0.7  PROT 6.7  ALBUMIN 3.9   No results for input(s): "LIPASE", "AMYLASE" in the last 168 hours. No results for input(s): "AMMONIA" in the last 168 hours.  ABG    Component Value Date/Time   PHART 7.369 04/19/2022 1326   PCO2ART 38.0 04/19/2022 1326   PO2ART 324 (H) 04/19/2022 1326   HCO3 21.9 04/19/2022 1326   TCO2 27 04/19/2022 1346   ACIDBASEDEF 3.0 (H) 04/19/2022 1326   O2SAT 100 04/19/2022 1326     Coagulation Profile: Recent Labs  Lab 04/18/22 1141 04/19/22 1351  INR 1.0 1.6*    Cardiac Enzymes: No results for input(s): "CKTOTAL", "CKMB", "CKMBINDEX", "TROPONINI" in the last 168 hours.  HbA1C: Hgb A1c MFr Bld  Date/Time Value Ref Range Status  04/18/2022 11:41 AM 5.5 4.8 - 5.6 % Final    Comment:    (NOTE)         Prediabetes: 5.7 - 6.4         Diabetes: >6.4         Glycemic control for adults with diabetes: <7.0   11/25/2021 11:08 AM 6.3 4.6 - 6.5 % Final    Comment:    Glycemic Control Guidelines for People with Diabetes:Non Diabetic:  <6%Goal of Therapy:  <7%Additional Action Suggested:  >8%     CBG: No results for input(s): "GLUCAP" in the last 168 hours.  Review of Systems:   Unable to be obtained due to mental status.  Past Medical History:  He,  has a past medical history of Dysphagia, Heart attack ([place]) (2020), Hyperlipidemia, Kidney stones, Myasthenia gravis ([place]), and STEMI (ST elevation myocardial infarction) ([place]).   Surgical History:   Past Surgical History:  Procedure Laterality Date   BACK SURGERY     CARDIAC CATHETERIZATION     COLONOSCOPY  2022   CORONARY/GRAFT ACUTE MI REVASCULARIZATION N/A 07/21/2018   Procedure: Coronary/Graft Acute MI Revascularization;  Surgeon: [name], MD;  Location: [place] [facility];  Service: Cardiovascular;  Laterality: N/A;   CYSTOSCOPY WITH INSERTION OF UROLIFT  2018   LASIK     LEFT HEART CATH AND CORONARY ANGIOGRAPHY N/A 07/21/2018   Procedure: LEFT HEART CATH AND CORONARY ANGIOGRAPHY;  Surgeon: [name], MD;  Location: [place] [facility];  Service: Cardiovascular;  Laterality: N/A;   LEFT HEART CATH AND CORONARY ANGIOGRAPHY N/A 12/08/2018   Procedure: LEFT HEART CATH AND CORONARY ANGIOGRAPHY;  Surgeon: Martinique, Peter M, MD;  Location: [place] [facility];  Service: Cardiovascular;  Laterality: N/A;   LEFT HEART CATH AND CORONARY ANGIOGRAPHY N/A 04/16/2022   Procedure: LEFT HEART CATH AND CORONARY ANGIOGRAPHY;  Surgeon: [name], MD;  Location: [place] [facility];  Service: Cardiovascular;  Laterality: N/A;   LITHOTRIPSY     NECK SURGERY     TONSILLECTOMY       Social History:   reports that he has never smoked. He quit smokeless tobacco use about 15 years ago.  His smokeless tobacco use included chew. He reports current alcohol use. He reports that he does not use drugs.   Family History:  His family history includes Deep vein thrombosis in his brother; Diabetes  in his sister; Esophageal cancer in his brother; Heart attack in his father and mother;  Heart disease in his mother; Stroke in his brother and sister.   Allergies Allergies  Allergen Reactions   Zetia [Ezetimibe] Other (See Comments)    Weakness, shortness of breath   Demerol [Meperidine Hcl]    Statins Other (See Comments)    myalgias     Home Medications  Prior to Admission medications   Medication Sig Start Date End Date Taking? Authorizing Provider  alum & mag hydroxide-simeth (MINTOX) 200-200-20 MG/5ML suspension Take 30 mLs by mouth as needed for indigestion or heartburn.   Yes [provider]  aspirin 81 MG chewable tablet Chew 1 tablet (81 mg total) by mouth daily. 07/24/18  Yes Cheryln Manly, NP  Calcium Carbonate-Vit D-Min (CALCIUM 1200 PO) Take 1 tablet by mouth daily.   Yes [provider]  Cyanocobalamin (VITAMIN B-12 PO) Take 1 tablet by mouth daily.   Yes [provider]  ferrous gluconate (FERGON) 324 MG tablet Take 1 tablet by mouth 3 (three) times a week. Monday, Wednesday AND Friday 07/22/20  Yes [provider]  isosorbide mononitrate (IMDUR) 60 MG 24 hr tablet Take 1 tablet (60 mg total) by mouth daily. 03/22/22 03/17/23 Yes Richardo Priest, MD  metoprolol succinate (TOPROL-XL) 25 MG 24 hr tablet Take 0.5 tablets (12.5 mg total) by mouth daily. 08/23/18  Yes Richardo Priest, MD  nitroGLYCERIN (NITROSTAT) 0.4 MG SL tablet Place 1 tablet (0.4 mg total) under the tongue every 5 (five) minutes as needed for chest pain. 11/10/21  Yes Richardo Priest, MD  Pitavastatin Calcium 2 MG TABS Take 1 tablet (2 mg total) by mouth daily. Patient taking differently: Take 2 mg by mouth See admin instructions. Take 2 mg (1 tablet) by mouth on Monday's and Friday's. 05/01/20  Yes Munley, Hilton Cork, MD  predniSONE (DELTASONE) 10 MG tablet Take 1 tablet (10 mg total) by mouth daily with breakfast. 12/18/21  Yes Patel, Donika K, DO  ramipril (ALTACE) 5 MG capsule Take 1 capsule (5 mg total) by mouth daily. 12/04/21  Yes Richardo Priest, MD   ranolazine (RANEXA) 500 MG 12 hr tablet Take 1 tablet (500 mg total) by mouth 2 (two) times daily. 03/23/19  Yes Richardo Priest, MD     Critical care time: 75 min.     Julian Hy, DO 04/19/22 4:13 PM [facility]  For contact information, see Amion. If no response to pager, please call PCCM consult pager. After hours, 7PM- 7AM, please call Elink.

## 2022-04-19 NOTE — Hospital Course (Addendum)
History of Present Illness:  The patient is a 77 year old male who presented to [facility] emergency department with left-sided chest pain.  Pain radiates to left arm with minimal substernal discomfort.  Reportedly he has been having some symptoms for the past several months.  The pain on today's date was described as severe.  He took 3 sublingual nitroglycerin which provided pain relief.  A CTA was negative for dissection.  Patient recently saw Dr. Bettina Gavia on 03/22/2022 for symptoms of shortness of breath and echocardiogram revealed normal LV and RV function, grade 1 diastolic dysfunction and no significant valvular disease.  Cardiac risk factors include history of CAD, HLD, and hypertension.  He previously underwent coronary angiogram and October 2020 which showed complex three-vessel CAD with severely calcified arteries.  This included a 70% ostial LAD, segmental 75% mid LAD, occluded first and second diagonals, 50% ostial and mid circumflex lesions, 70% ostial large OM lesion and a 70% ostial RCA, 80% distal RCA stenosis at the bifurcation.  He was transferred to [facility] with the diagnosis of unstable angina.  He has previously declined CABG in the past.  Additional significant medical history includes a normocytic anemia and myasthenia gravis for which she is on prednisone.  He has no history of cigarette use but has used smokeless tobacco.  A repeat cardiac catheterization was performed today and these results are described below.  The primary change in anatomy from the previous study in 2020 is in the 99% lesion in the obtuse marginal.  We are asked to see the patient in CT surgical consultation for consideration of CABG.  Hospital Course:  The patient was evaluated by Dr. Tenny Craw who felt the patient would be a surgical candidate.  The risks and benefits of the procedure were explained to the patient and was agreeable to proceed.  Neurology consult was obtained prior to surgery for anesthesia  recommendations with his Myasthenia Gravis.  He was taken to the operating room on 04/19/2022.  He underwent Thymectomy, CABG x 3 utillizing LIMA to LAD, SVG to OM with endarterectomy, and SVG to Distal RCA.  He also underwent endscopic harvest of greater saphenous vein from his right leg and Clipping of LA Appendage.  He tolerated the procedure without difficulty and was taken to the SICU in stable condition. The critical care team assisted with vent management and early pos-op care. The patient was left on the ventilator electively overnight given his history of myasthenia gravis. He remained stable hemodynamically. The FiO2 and vent support were weaned and he was extubated using standard protocols on post-op day 1. The epinephrine was weaned off slowly. He was treated with IV stress-dose steroids for 24 hours.  His chest tubes were removed without difficulty.  He was not started on Beta blocker therapy with underlying Myasthenia Gravis.  He developed post operative blood loss anemia and required transfusion of a unit of packed cells.  His repeat hemoglobin level was 7.9.  Chest x-ray showed bilateral pleural effusions/atelectasis. He developed a mild ileus postoperatively.  He was kept on a clear liquid diet until he had return of bowel function.  He was able to move his bowels multiple times after Miralax.  He developed a fever and elevated white count.  Blood cultures were obtained and showed growth Enterobacter. He had a UA which was negative for UTI.  Surgical incisions showed no evidence of infection.  He was tachycardic and overall felt unwell and started on empiric ABX.  Echocardiogram was obtained and showed normal  EF and no abnormalities.  He was stable for transfer to the progressive care unit on 04/28/2022.  Final blood culture results show with final results showing Hafnia Alvei.  ID consult was obtained and they transitioned to Ertapenem.  Sensitivities revealed sensitivity to Bactrim DS.  He was  transitioned to an oral regimen for 14 days of coverage.  The patient is ambulating.  His surgical incisions are healing without evidence of infection.  He is medically stable for discharge home today.

## 2022-04-19 NOTE — Anesthesia Procedure Notes (Signed)
Central Venous Catheter Insertion Performed by: Audry Pili, MD, anesthesiologist Start/End2/26/2024 7:10 AM, 04/19/2022 7:12 AM Patient location: Pre-op. Preanesthetic checklist: patient identified, IV checked, risks and benefits discussed, surgical consent, monitors and equipment checked, pre-op evaluation, timeout performed and anesthesia consent Position: Trendelenburg Hand hygiene performed  and maximum sterile barriers used  Total catheter length 10. PA cath was placed.Swan type:thermodilution PA Cath depth:50 Procedure performed without using ultrasound guided technique. Attempts: 1 Patient tolerated the procedure well with no immediate complications.

## 2022-04-19 NOTE — Op Note (Addendum)
OPERATIVE NOTE: Patient Name: Adrian Bell Date of Birth: [birth year] Date of Operation: 04/19/22  PRE-OPERATIVE DIAGNOSIS: Coronary artery disease Myasthenia Gravis with bulbar symptomatology History of PCI  POST-OPERATIVE DIAGNOSIS: Same  OPERATION: CABG x 3 (LIMA - LAD, SVG - OM, SVG - PDA) Coronary endarterectomy, left circumflex Thymectomy Endoscopic saphenous vein harvest Left atrial appendage occlusion (Atriclip 78m)  SURGEON: DPierre BaliEnter MD   ASSISTANT: EEllwood HandlerPA  EBL 100cc  FINDINGS: EF 55-60% before surgery EF same after surgery   Adequate conduits  Poor OM target with one soft spot, not unexpectedly required large endarterectomy.   LAD 1.75 mm OM 2.0 mm, flow 80 cc/min, ~4-5 cm endarterectomy PDA 1.761m flow 40cc/min   TIMES: XC:  173 min CPB:  203 min   SPECIMENS Endarterectomy segment of left circumflex (partial), approximately 1.123456 COMPLICATIONS: None  TUBES:  3 24 Fr blakes (bilateral pleural and mediastinal) 1 JP to Bulb (mediastinal)   PROCEDURE IN DETAIL: The patient was brought in the operating room and laid in supine position.  The patient was prepped and draped in standard fashion.  An arterial, pulmonary arterial and venous lines were placed by anesthesia along with a single-lumen endotracheal tube. Vein was harvested out of the leg endoscopically by the physician assistant. Local analgesia given to the sternum. Sternotomy was performed. LIMA taken down and 5000u heparin given. The entire thymus from superiorly at the innominate vein to laterally on both sides over to pleural fat staying at least 2cm away from phrenic nerves, the thymus was removed. Small branches were clipped of vasculature. Small amount of pericardium was resected with this.  The pericardium was opened.  Full dose heparin was given to achieve an ACT of 480 and aortic and venous cannulas were inserted.The patient was placed on cardiopulmonary bypass and using a  cross-clamp and cardiac arrest was achieved with 1.2  L of modified blood [place] cardioplegia antegrade. Topical cooling was used as well on the heart.    Next attention was turned to the coronary arteries. The OM was nearly completely calcified. There was one soft spot just at the OM takeoff trifurcation. The available vessel at this point OM3 went intramyocardial and became small. Did not appear promising to graft it distally. I found one soft spot near the trifurcation. I opened this. To complete an anastomosis, we needed to do an endarterectomy. This was completed in a meticulous fashion and proximal and distal flushed with cardioplegia so no debris present. SVG overlay of about 4cm was placed on the coronary artery with 7-0 prolene. This flowed 80cc and indicated to me we may have also opened up OM1 OM2 with this maneuver and thus become more than a 3 vessel CAB. In terms of the number of anastomoses, I'm classifying this surgery as a CAB x 3 though.    I then placed a 4060mtriclip on the left atrial appendage, fully occluding it at the base. Then proximal of OM to aorta with 6-0 prolene. PDA sewn distally with 7-0 prolene, then proximal to the aorta with 6-0 prolene.  After vein grafts complete, LIMA sewn to LAD with 7-0 prolene.     Next, the cross-clamp was released  with the root vent on.  After de-airing the heart came back into regular sinus rhythm and the heart took over the circulation. Bypass was weaned and cannulas were removed, then protamine was given and hemostasis was achieved. Chest tubes were placed as above along with ventricular and atrial  wires.  The chest was then closed in interrupted steel wire and the presternal layers were closed in 3 layers of absorbable suture.  The patient had a stable status and was transferred to the postoperative care unit on minimal doses of vasoactive medications. All surgical counts were correct.

## 2022-04-19 NOTE — Anesthesia Procedure Notes (Signed)
Arterial Line Insertion Start/End2/26/2024 7:00 AM, 04/19/2022 7:05 AM Performed by: Leonor Liv, CRNA, CRNA  Patient location: Pre-op. Preanesthetic checklist: patient identified, IV checked, site marked, risks and benefits discussed, surgical consent, monitors and equipment checked, pre-op evaluation, timeout performed and anesthesia consent Lidocaine 1% used for infiltration Left, radial was placed Catheter size: 20 G Hand hygiene performed  and maximum sterile barriers used   Attempts: 2 Procedure performed without using ultrasound guided technique. Following insertion, dressing applied. Post procedure assessment: normal and unchanged  Patient tolerated the procedure well with no immediate complications.

## 2022-04-20 ENCOUNTER — Other Ambulatory Visit: Payer: Self-pay

## 2022-04-20 ENCOUNTER — Encounter (HOSPITAL_COMMUNITY): Payer: Self-pay | Admitting: Cardiothoracic Surgery

## 2022-04-20 ENCOUNTER — Inpatient Hospital Stay (HOSPITAL_COMMUNITY): Payer: Non-veteran care

## 2022-04-20 DIAGNOSIS — G7000 Myasthenia gravis without (acute) exacerbation: Secondary | ICD-10-CM | POA: Diagnosis not present

## 2022-04-20 DIAGNOSIS — I214 Non-ST elevation (NSTEMI) myocardial infarction: Secondary | ICD-10-CM | POA: Diagnosis not present

## 2022-04-20 DIAGNOSIS — Z951 Presence of aortocoronary bypass graft: Secondary | ICD-10-CM | POA: Diagnosis not present

## 2022-04-20 DIAGNOSIS — Z9911 Dependence on respirator [ventilator] status: Secondary | ICD-10-CM | POA: Diagnosis not present

## 2022-04-20 LAB — PREPARE PLATELET PHERESIS: Unit division: 0

## 2022-04-20 LAB — CBC
HCT: 22 % — ABNORMAL LOW (ref 39.0–52.0)
HCT: 26.6 % — ABNORMAL LOW (ref 39.0–52.0)
Hemoglobin: 7.3 g/dL — ABNORMAL LOW (ref 13.0–17.0)
Hemoglobin: 8.7 g/dL — ABNORMAL LOW (ref 13.0–17.0)
MCH: 30.7 pg (ref 26.0–34.0)
MCH: 30.1 pg (ref 26.0–34.0)
MCHC: 33.2 g/dL (ref 30.0–36.0)
MCHC: 32.7 g/dL (ref 30.0–36.0)
MCV: 92.4 fL (ref 80.0–100.0)
MCV: 92 fL (ref 80.0–100.0)
Platelets: 163 10*3/uL (ref 150–400)
Platelets: 153 10*3/uL (ref 150–400)
RBC: 2.38 MIL/uL — ABNORMAL LOW (ref 4.22–5.81)
RBC: 2.89 MIL/uL — ABNORMAL LOW (ref 4.22–5.81)
RDW: 14.9 % (ref 11.5–15.5)
RDW: 15.5 % (ref 11.5–15.5)
WBC: 10.5 10*3/uL (ref 4.0–10.5)
WBC: 12.9 10*3/uL — ABNORMAL HIGH (ref 4.0–10.5)
nRBC: 0 % (ref 0.0–0.2)
nRBC: 0.2 % (ref 0.0–0.2)

## 2022-04-20 LAB — PREPARE FRESH FROZEN PLASMA
Unit division: 0
Unit division: 0

## 2022-04-20 LAB — COOXEMETRY PANEL
Carboxyhemoglobin: 1.3 % (ref 0.5–1.5)
Methemoglobin: <0.7 % (ref 0.0–1.5)
O2 Saturation: 64.7 %
Total hemoglobin: 7.4 g/dL — ABNORMAL LOW (ref 12.0–16.0)

## 2022-04-20 LAB — POCT I-STAT 7, (LYTES, BLD GAS, ICA,H+H)
Acid-base deficit: 6 mmol/L — ABNORMAL HIGH (ref 0.0–2.0)
Acid-base deficit: 9 mmol/L — ABNORMAL HIGH (ref 0.0–2.0)
Bicarbonate: 19.5 mmol/L — ABNORMAL LOW (ref 20.0–28.0)
Bicarbonate: 16.1 mmol/L — ABNORMAL LOW (ref 20.0–28.0)
Calcium, Ion: 1.21 mmol/L (ref 1.15–1.40)
Calcium, Ion: 1.18 mmol/L (ref 1.15–1.40)
HCT: 21 % — ABNORMAL LOW (ref 39.0–52.0)
HCT: 21 % — ABNORMAL LOW (ref 39.0–52.0)
Hemoglobin: 7.1 g/dL — ABNORMAL LOW (ref 13.0–17.0)
Hemoglobin: 7.1 g/dL — ABNORMAL LOW (ref 13.0–17.0)
O2 Saturation: 99 %
O2 Saturation: 99 %
Patient temperature: 37.3
Patient temperature: 37.6
Potassium: 4.3 mmol/L (ref 3.5–5.1)
Potassium: 4.3 mmol/L (ref 3.5–5.1)
Sodium: 143 mmol/L (ref 135–145)
Sodium: 140 mmol/L (ref 135–145)
TCO2: 21 mmol/L — ABNORMAL LOW (ref 22–32)
TCO2: 17 mmol/L — ABNORMAL LOW (ref 22–32)
pCO2 arterial: 36.5 mmHg (ref 32–48)
pCO2 arterial: 30 mmHg — ABNORMAL LOW (ref 32–48)
pH, Arterial: 7.338 — ABNORMAL LOW (ref 7.35–7.45)
pH, Arterial: 7.339 — ABNORMAL LOW (ref 7.35–7.45)
pO2, Arterial: 168 mmHg — ABNORMAL HIGH (ref 83–108)
pO2, Arterial: 153 mmHg — ABNORMAL HIGH (ref 83–108)

## 2022-04-20 LAB — GLUCOSE, CAPILLARY
Glucose-Capillary: 125 mg/dL — ABNORMAL HIGH (ref 70–99)
Glucose-Capillary: 147 mg/dL — ABNORMAL HIGH (ref 70–99)
Glucose-Capillary: 163 mg/dL — ABNORMAL HIGH (ref 70–99)
Glucose-Capillary: 165 mg/dL — ABNORMAL HIGH (ref 70–99)
Glucose-Capillary: 184 mg/dL — ABNORMAL HIGH (ref 70–99)
Glucose-Capillary: 192 mg/dL — ABNORMAL HIGH (ref 70–99)

## 2022-04-20 LAB — BASIC METABOLIC PANEL
Anion gap: 11 (ref 5–15)
Anion gap: 11 (ref 5–15)
BUN: 15 mg/dL (ref 8–23)
BUN: 15 mg/dL (ref 8–23)
CO2: 19 mmol/L — ABNORMAL LOW (ref 22–32)
CO2: 22 mmol/L (ref 22–32)
Calcium: 7.7 mg/dL — ABNORMAL LOW (ref 8.9–10.3)
Calcium: 8.1 mg/dL — ABNORMAL LOW (ref 8.9–10.3)
Chloride: 109 mmol/L (ref 98–111)
Chloride: 107 mmol/L (ref 98–111)
Creatinine, Ser: 1.08 mg/dL (ref 0.61–1.24)
Creatinine, Ser: 1.14 mg/dL (ref 0.61–1.24)
GFR, Estimated: >60 mL/min (ref >60)
GFR, Estimated: >60 mL/min (ref >60)
Glucose, Bld: 175 mg/dL — ABNORMAL HIGH (ref 70–99)
Glucose, Bld: 144 mg/dL — ABNORMAL HIGH (ref 70–99)
Potassium: 4.2 mmol/L (ref 3.5–5.1)
Potassium: 3.6 mmol/L (ref 3.5–5.1)
Sodium: 139 mmol/L (ref 135–145)
Sodium: 140 mmol/L (ref 135–145)

## 2022-04-20 LAB — BPAM FFP
Blood Product Expiration Date: 202402272359
Blood Product Expiration Date: 202402292359
ISSUE DATE / TIME: 202402261137
ISSUE DATE / TIME: 202402261137
Unit Type and Rh: 6200
Unit Type and Rh: 6200

## 2022-04-20 LAB — BPAM PLATELET PHERESIS
Blood Product Expiration Date: 202402292359
ISSUE DATE / TIME: 202402261405
Unit Type and Rh: 7300

## 2022-04-20 LAB — PREPARE RBC (CROSSMATCH)

## 2022-04-20 LAB — SURGICAL PATHOLOGY

## 2022-04-20 LAB — MAGNESIUM
Magnesium: 2.1 mg/dL (ref 1.7–2.4)
Magnesium: 2 mg/dL (ref 1.7–2.4)

## 2022-04-20 MED ORDER — POTASSIUM CHLORIDE 10 MEQ/50ML IV SOLN
INTRAVENOUS | Status: AC
  Administered 2022-04-20: 10 meq via INTRAVENOUS
  Filled 2022-04-20: qty 150

## 2022-04-20 MED ORDER — CLOPIDOGREL BISULFATE 75 MG PO TABS
75.0000 mg | ORAL_TABLET | Freq: Every day | ORAL | Status: DC
  Administered 2022-04-20: 75 mg
  Filled 2022-04-20: qty 1

## 2022-04-20 MED ORDER — ORAL CARE MOUTH RINSE
15.0000 mL | Freq: Three times a day (TID) | OROMUCOSAL | Status: DC
  Administered 2022-04-21 (×4): 15 mL via OROMUCOSAL

## 2022-04-20 MED ORDER — POTASSIUM CHLORIDE 10 MEQ/100ML IV SOLN: 10.0000 meq | INTRAVENOUS | Status: DC

## 2022-04-20 MED ORDER — SODIUM BICARBONATE 8.4 % IV SOLN
50.0000 meq | Freq: Once | INTRAVENOUS | Status: AC
  Administered 2022-04-20: 50 meq via INTRAVENOUS

## 2022-04-20 MED ORDER — FUROSEMIDE 10 MG/ML IJ SOLN
20.0000 mg | Freq: Every day | INTRAMUSCULAR | Status: DC
  Administered 2022-04-20: 20 mg via INTRAVENOUS
  Filled 2022-04-20: qty 2

## 2022-04-20 MED ORDER — INSULIN ASPART 100 UNIT/ML IJ SOLN
0.0000 [IU] | INTRAMUSCULAR | Status: DC
  Administered 2022-04-20 – 2022-04-23 (×11): 2 [IU] via SUBCUTANEOUS

## 2022-04-20 MED ORDER — CLOPIDOGREL BISULFATE 75 MG PO TABS
75.0000 mg | ORAL_TABLET | Freq: Every day | ORAL | Status: DC
  Administered 2022-04-21 – 2022-04-30 (×10): 75 mg via ORAL
  Filled 2022-04-20 (×10): qty 1

## 2022-04-20 MED ORDER — POTASSIUM CHLORIDE 10 MEQ/50ML IV SOLN
10.0000 meq | INTRAVENOUS | Status: AC
  Administered 2022-04-20 (×2): 10 meq via INTRAVENOUS

## 2022-04-20 MED ORDER — ASPIRIN 81 MG PO TBEC
81.0000 mg | DELAYED_RELEASE_TABLET | Freq: Every day | ORAL | Status: DC
  Administered 2022-04-21 – 2022-04-30 (×10): 81 mg via ORAL
  Filled 2022-04-20 (×10): qty 1

## 2022-04-20 NOTE — Progress Notes (Signed)
Patient completed NIF and Incentive spirometer with RT with great technique. NIF > -40 IS > 1000 mL  VC not operating correctly at this time. Will bring new VC for next scheduled NIF and VC.

## 2022-04-20 NOTE — Progress Notes (Signed)
NAME:  Adrian Bell, MRN:  [id number], DOB:  [birth year], LOS: 5 ADMISSION DATE:  04/15/2022, CONSULTATION DATE:  2/26 REFERRING MD:  Tenny Craw, CHIEF COMPLAINT:  post-CABG   History of Present Illness:  Mr. Crusan is a 77 y/o gentleman with a history of CAD s/p previous PCI who presented to [facility] from [place] with an NSTEMI. He presented with left-sided chest pain that was worse than his typical intermittent left chest pain, requiring 3 SL NTG to relieve his pain. He recently has endorsed exertional dyspnea as well. He underwent LHC upon transfer to [facility] and was found to have severe 3-vessel disease. He was evaluated by Cardiothoracic surgery for consideration of bypass surgery. At baseline he is active and independent. He has been managed with his baseline cardiac medications and heparin until today. EF is preserved. Cardiac risk factors include previous use of smokeless tobacco, HTN, HLD, age, gender. He underwent CABG x 3, LCX end-arterectomy, LAA clipping, and empiric thymectomy given history of MG.  Post-operatively he is on epinephrine. Last dose of paralytics was noon today; before leaving the OR he had only 1/4 twitches. Total rocuronium dose today '130mg'$ .   He has a history of MG which was diagnosed in 2018. His symptoms include diplopia, dysarthria, dysphagia. His maintenance regimen for MG is porednisone '10mg'$  daily with 1 previous flare since diagnosis. He has had an adverse reaction to IVIG in the past and was discontinued from this maintenance regimen in 12/2021.   Pertinent  Medical History  CAD HTN HLD MG on prednisone '10mg'$  daily nephrolithiasis  Significant Hospital Events: Including procedures, antibiotic start and stop dates in addition to other pertinent events   2/23 admission, LHC 2/26 CABG x 3, Lcx end-arterectomy, LAA clip, thymectomy  Interim History / Subjective:  Overnight no acute events. His only complaint is from his ETT causing discomfort. No CP. This morning NIF -25, VC  2.2L  Objective   Blood pressure (!) 105/58, pulse 70, temperature 99.5 F (37.5 C), resp. rate 18, height '5\' 6"'$  (1.676 m), weight 78.9 kg, SpO2 99 %. PAP: (14-36)/(8-33) 26/23 CVP:  [1 mmHg-18 mmHg] 7 mmHg CO:  [3.1 L/min-4.1 L/min] 4.1 L/min CI:  [1.7 L/min/m2-2.2 L/min/m2] 2.2 L/min/m2  Vent Mode: PRVC FiO2 (%):  [40 %-50 %] 40 % Set Rate:  [16 bmp] 16 bmp Vt Set:  [510 mL] 510 mL PEEP:  [5 cmH20] 5 cmH20 Plateau Pressure:  [20 cmH20] 20 cmH20   Intake/Output Summary (Last 24 hours) at 04/20/2022 0707 Last data filed at 04/20/2022 0700 Gross per 24 hour  Intake 5524.87 ml  Output 5287 ml  Net 237.87 ml    Filed Weights   04/16/22 0300 04/18/22 1010 04/20/22 0500  Weight: 79.5 kg 80.2 kg 78.9 kg    Examination: General: critically ill appearing man lying in bed in NAD HENT: /AT, eyes anicteric Lungs: breathing comfortably on MV, CTAB Cardiovascular: S1S2, RRR. Sternal dressing intact. Bloody output from chest tubes.  Abdomen: soft, NT Extremities: no cyanosis Neuro: RASS 0, moving all extremities, attempting to answer questions. GU: clear yellow urine from foley catheter.  Chest tube output: 370cc +  bulb drain 20cc overnight.  CXR personally reviewed>  ETT about 1.8 cm above carina. Chest tubes, bibasilar atelectasis. Large gastric air bubble. EKG: NSR, RBBB, resolving ST depressions- only now present in AVL.  7.34/37/168/20 BUN 15 Cr 1.08 H/H 7.3/22 Platelets 163   Resolved Hospital Problem list     Assessment & Plan:  3-vessel CAD with NSTEMI, s/p  CABG x 3, end-arterectomy of LCX, LAA clipping, thymectomy -complete post-op antibiotics -wean off NE as able to maintain MAP >65 -pain control per protocol -DAPT for end-arterectomy -hold statin and metoprolol today due to MG; if he is doing well post-op without concerns for weakness can add back incrementally. Tolerated Bblocker PTA. -Avoid empiric magnesium repletion; try to avoid giving IV.  MG, no  history of respiratory muscle weakness, now s/p thymectomy. No known history of thymoma. -avoid IVIG per Neuro. If he has a flare this admission, will require PLEX per Neuro. -avoid higher dose steroids -NIF and VC q4h, close monitoring for respiratory muscle weakness -need to montior for dysarthria and dysphagia post-extubation  At risk for adrenal insufficiency due to chronic steroid use -reduced dose stress dose steroid regimen per neuro: hydrocortisone 100 mg followed by 50 mg q8hr (equivalent to prednisone 25 mg followed by 12.5 mg q8hr); would avoid excessively high doses of steroids equivalent to >100 mg prednisone daily due to risk of causing exacerbation with high-dose steroids. Received first dose in the OR; can hold off on additional doses unless he is refractory coming off pressors.   Post-op vent management -SBT this morning; will plan to extubate to [place] if he passes -monitor for ability to manage secretions post-extubation  Hyperglycemia -transition to basal bolus insulin once eating.  Post-op anemia. Has dropped 1.5g post-op.  Consumptive thrombocytopenia, resolved -monitor chest tube output -transfuse for Hb <7 or hemodynamically significant bleeding in discussion with TCTS    Best Practice (right click and "Reselect all SmartList Selections" daily)   Diet/type: NPO DVT prophylaxis: SCD GI prophylaxis: PPI Lines: Central line and Arterial Line Foley:  Yes, and it is still needed Code Status:  full code Last date of multidisciplinary goals of care discussion [ per primary ]  Labs   CBC: Recent Labs  Lab 04/18/22 0022 04/19/22 0558 04/19/22 0816 04/19/22 1245 04/19/22 1251 04/19/22 1351 04/19/22 1554 04/19/22 1555 04/19/22 2104 04/20/22 0345  WBC 6.5 6.7  --   --   --   --   --  8.8 7.0 10.5  HGB 9.9* 10.6*   < > 7.2*   < > 7.8* 9.2* 9.1* 7.7* 7.3*  7.1*  HCT 29.6* 32.3*   < > 21.4*   < > 23.4* 27.0* 28.2* 22.8* 22.0*  21.0*  MCV 94.3 94.4  --   --    --   --   --  93.4 92.3 92.4  PLT 234 270  --  121*  --  111*  --  148* 129* 163   < > = values in this interval not displayed.     Basic Metabolic Panel: Recent Labs  Lab 04/16/22 0655 04/18/22 1141 04/19/22 0558 04/19/22 0816 04/19/22 1201 04/19/22 1251 04/19/22 1326 04/19/22 1346 04/19/22 1554 04/19/22 2104 04/20/22 0345  NA 142 139 139   < > 141 143 142 143 145 142 139  143  K 4.0 4.1 4.2   < > 5.1 4.7 4.9 4.3 4.2 4.2 4.2  4.3  CL 108 106 105   < > 109 108  --  109  --  112* 109  CO2 '22 23 22  '$ --   --   --   --   --   --  21* 19*  GLUCOSE 94 125* 94   < > 155* 139*  --  155*  --  142* 175*  BUN '17 18 19   '$ < > 17 17  --  16  --  15 15  CREATININE 1.07 1.09 1.20   < > 0.70 0.70  --  0.60*  --  0.90 1.08  CALCIUM 8.7* 9.2 8.8*  --   --   --   --   --   --  7.8* 7.7*  MG  --   --   --   --   --   --   --   --   --  2.2 2.1   < > = values in this interval not displayed.    GFR: Estimated Creatinine Clearance: 57.4 mL/min (by C-G formula based on SCr of 1.08 mg/dL). Recent Labs  Lab 04/19/22 0558 04/19/22 1555 04/19/22 2104 04/20/22 0345  WBC 6.7 8.8 7.0 10.5     Liver Function Tests: Recent Labs  Lab 04/18/22 1141  AST 27  ALT 14  ALKPHOS 54  BILITOT 0.7  PROT 6.7  ALBUMIN 3.9    No results for input(s): "LIPASE", "AMYLASE" in the last 168 hours. No results for input(s): "AMMONIA" in the last 168 hours.  ABG    Component Value Date/Time   PHART 7.338 (L) 04/20/2022 0345   PCO2ART 36.5 04/20/2022 0345   PO2ART 168 (H) 04/20/2022 0345   HCO3 19.5 (L) 04/20/2022 0345   TCO2 21 (L) 04/20/2022 0345   ACIDBASEDEF 6.0 (H) 04/20/2022 0345   O2SAT 64.7 04/20/2022 0415     Critical care time: 45 min.     Julian Hy, DO 04/20/22 8:00 AM [facility]  For contact information, see Amion. If no response to pager, please call PCCM consult pager. After hours, 7PM- 7AM, please call Elink.

## 2022-04-20 NOTE — Progress Notes (Signed)
Pt performed NIF -25, VC 2.2 '@0530'$  Without complications. Pt was not awake enough to perform q4 NIF and FVC.  Pt currently alert and oriented. Vitals stable. Will continue to monitor

## 2022-04-20 NOTE — Progress Notes (Signed)
TCTS DAILY ICU PROGRESS NOTE                   [address]            [place] [address]          [phone]   1 Day Post-Op Procedure(s) (LRB): CORONARY ARTERY BYPASS GRAFTING (CABG) X3 USING LEFT INTERNAL MAMMARY ARTERY (LIMA) AND ENDOSCOPICALLY HARVESTED RIGHT GREATER SAPHENOUS VEIN AND CORONARY ENDARTERECTOMY (N/A) TRANSESOPHAGEAL ECHOCARDIOGRAM (N/A) THYMECTOMY (N/A) CLIPPING OF ATRIAL APPENDAGE USING ATRICURE 40MM ATRICLIP  Total Length of Stay:  LOS: 5 days   Subjective: Just extubated.  Awake and appropriately responsive.   Epi at 31mg. VS and hemodynamics stable since surgery.   Objective: Vital signs in last 24 hours: Temp:  [95.9 F (35.5 C)-99.7 F (37.6 C)] 99.5 F (37.5 C) (02/27 0700) Pulse Rate:  [61-163] 70 (02/27 0700) Cardiac Rhythm: Normal sinus rhythm;Bundle branch block (02/27 0600) Resp:  [11-28] 18 (02/27 0700) BP: (72-155)/(52-111) 105/58 (02/27 0700) SpO2:  [85 %-100 %] 99 % (02/27 0700) Arterial Line BP: (59-163)/(31-90) 60/48 (02/27 0700) FiO2 (%):  [40 %-50 %] 40 % (02/27 0350) Weight:  [78.9 kg] 78.9 kg (02/27 0500)  Filed Weights   04/16/22 0300 04/18/22 1010 04/20/22 0500  Weight: 79.5 kg 80.2 kg 78.9 kg    Weight change: -1.251 kg   Hemodynamic parameters for last 24 hours: PAP: (14-36)/(8-33) 26/23 CVP:  [1 mmHg-18 mmHg] 7 mmHg CO:  [3.1 L/min-4.1 L/min] 4.1 L/min CI:  [1.7 L/min/m2-2.2 L/min/m2] 2.2 L/min/m2  Intake/Output from previous day: 02/26 0701 - 02/27 0700 In: 5524.9 [I.V.:3148.6; Blood:1042; NG/GT:230; IV Piggyback:1104.3] Out: 5287 [Urine:3485; Drains:230; Blood:932; Chest Tube:640]  Intake/Output this shift: No intake/output data recorded.  Current Meds: Scheduled Meds:  acetaminophen  1,000 mg Oral Q6H   Or   acetaminophen (TYLENOL) oral liquid 160 mg/5 mL  1,000 mg Per Tube Q6H   aspirin EC  325 mg Oral Daily   Or   aspirin  324 mg Per Tube Daily   bisacodyl  10 mg Oral Daily   Or    bisacodyl  10 mg Rectal Daily   Chlorhexidine Gluconate Cloth  6 each Topical Daily   [START ON 04/21/2022] clopidogrel  75 mg Oral Daily   docusate sodium  200 mg Oral Daily   hydrocortisone sod succinate (SOLU-CORTEF) inj  50 mg Intravenous Q8H   insulin aspart  0-24 Units Subcutaneous Q4H   mouth rinse  15 mL Mouth Rinse Q2H   [START ON 04/21/2022] pantoprazole  40 mg Oral Daily   pravastatin  40 mg Oral q1800   sodium chloride flush  3 mL Intravenous Q12H   Continuous Infusions:  sodium chloride Stopped (04/19/22 2140)   sodium chloride     sodium chloride 20 mL/hr at 04/20/22 0700    ceFAZolin (ANCEF) IV Stopped (04/20/22 0559)   epinephrine 6.5 mcg/min (04/20/22 0700)   lactated ringers 20 mL/hr at 04/20/22 0700   lactated ringers Stopped (04/19/22 1901)   PRN Meds:.sodium chloride, midazolam, morphine injection, ondansetron (ZOFRAN) IV, mouth rinse, oxyCODONE, sodium chloride flush, traMADol  General appearance: alert, cooperative, and no distress Neurologic: no focal deficits Heart: RRR, CI 2.2 Lungs: breath sounds clear anterior. CXR with no unexpected changes. CT drainage 370/12 hours, mediastinal JP 228m12 hours Abdomen: firm but not tender, absent bowel sounds Extremities: SCD's to LE's, all warm and perfused.  Wound: the sternotomy incision is covered with a dry Aquacel dressing  Lab Results:  CBC: Recent Labs    04/19/22 2104 04/20/22 0345  WBC 7.0 10.5  HGB 7.7* 7.3*  7.1*  HCT 22.8* 22.0*  21.0*  PLT 129* 163   BMET:  Recent Labs    04/19/22 2104 04/20/22 0345  NA 142 139  143  K 4.2 4.2  4.3  CL 112* 109  CO2 21* 19*  GLUCOSE 142* 175*  BUN 15 15  CREATININE 0.90 1.08  CALCIUM 7.8* 7.7*    CMET: Lab Results  Component Value Date   WBC 10.5 04/20/2022   HGB 7.3 (L) 04/20/2022   HGB 7.1 (L) 04/20/2022   HCT 22.0 (L) 04/20/2022   HCT 21.0 (L) 04/20/2022   PLT 163 04/20/2022   GLUCOSE 175 (H) 04/20/2022   CHOL 216 (H) 11/10/2021    TRIG 202 (H) 11/10/2021   HDL 58 11/10/2021   LDLCALC 123 (H) 11/10/2021   ALT 14 04/18/2022   AST 27 04/18/2022   NA 139 04/20/2022   NA 143 04/20/2022   K 4.2 04/20/2022   K 4.3 04/20/2022   CL 109 04/20/2022   CREATININE 1.08 04/20/2022   BUN 15 04/20/2022   CO2 19 (L) 04/20/2022   TSH 2.32 11/25/2021   INR 1.4 (H) 04/19/2022   HGBA1C 5.5 04/18/2022      PT/INR:  Recent Labs    04/19/22 1555  LABPROT 16.8*  INR 1.4*   Radiology: [facility] Chest Port 1 View  Result Date: 04/20/2022 CLINICAL DATA:  Status post CABG x3. EXAM: PORTABLE CHEST 1 VIEW COMPARISON:  04/19/2022. FINDINGS: 0518 hours. Endotracheal tube tip projects over the midthoracic trachea. Unchanged right IJ approach pulmonary arterial catheter with tip projecting over the main pulmonary artery. The enteric tube side port projects over the GE junction with mild gaseous distention of the stomach. Unchanged chest tubes x3. Low lung volumes accentuate the pulmonary vasculature and cardiomediastinal silhouette. Bibasilar atelectasis. Small bilateral pleural effusions. No pneumothorax. Decreased size of the cardiomediastinal silhouette. IMPRESSION: 1. Enteric tube side port projects over the GE junction mild gaseous distention of the stomach. Interval advancement of at least 3 cm is recommended for optimal function. 2. Otherwise stable support apparatus. 3. Small bilateral pleural effusions and bibasilar atelectasis with bilateral chest tubes in place. Electronically Signed   By: Emmit Alexanders M.D.   On: 04/20/2022 08:18   DG Chest Port 1 View  Result Date: 04/19/2022 CLINICAL DATA:  Status post CABG. EXAM: PORTABLE CHEST 1 VIEW COMPARISON:  04/18/2022 FINDINGS: ET tube tip is above the carina. There is a Swan-Ganz catheter with tip in the proximal right pulmonary artery. Mediastinal drain and bilateral chest tubes are in place. There is an enteric tube with tip and side port below the GE junction. Lung volumes are low. Pulmonary  vascular congestion. No pleural effusion, interstitial edema or pneumothorax. IMPRESSION: 1. Satisfactory position of support apparatus. 2. Low lung volumes and pulmonary vascular congestion. Electronically Signed   By: Kerby Moors M.D.   On: 04/19/2022 15:57   EP STUDY  Result Date: 04/19/2022 See surgical note for result.    Assessment/Plan: S/P Procedure(s) (LRB): CORONARY ARTERY BYPASS GRAFTING (CABG) X3 USING LEFT INTERNAL MAMMARY ARTERY (LIMA) AND ENDOSCOPICALLY HARVESTED RIGHT GREATER SAPHENOUS VEIN AND CORONARY ENDARTERECTOMY (N/A) TRANSESOPHAGEAL ECHOCARDIOGRAM (N/A) THYMECTOMY (N/A) CLIPPING OF ATRIAL APPENDAGE USING ATRICURE 40MM ATRICLIP  -POD 1 CABG x 3 with coronary endarterectomy to the OM for MVCAD presenting with unstable angina and normal biventricular function. Stable hemodynamics, weaning the epi. On ASA, avoiding statin  and BB due to h/o MG. On Plavix for coronary endarterectomy.  CT drainage is tapering off.   -Myasthenia Gravis- s/p thymectomy at time of CABG. Managed with prednisone '10mg'$  daily prior to admission. Has received solu-medrol 50 q8h x 2 of 3 scheduled doses. Breathing comfortably since extubation about 61mn ago. Will need to avoid Mg++ post op due to myasthenia gravis and other medications as listed in the neuro consult 2/23.   -Expected acute blood loss anemia - Hct 21, CT drainage is tapering off. Will transfuse 1 unit PRBC's and monitor.   -RENAL- normal function at baseline. Good UO, current Wt is below pre-op if accurate.   -GI- Just extubated, try clears later, watch for dysphagia.   -DVT PPX- SCD's in place. Mobilize as able.    MAntony Odea PA-C 04/20/2022 8:23 AM

## 2022-04-20 NOTE — Procedures (Signed)
Extubation Procedure Note  Patient Details:   Name: Adrian Bell DOB: 14-Dec-1945 MRN: [id number]   Airway Documentation:  Airway (Active)  Secured at (cm) 23 cm 04/20/22 0350  Measured From Lips 04/20/22 [place] 04/20/22 0350  Secured By [employer] 04/20/22 0350  Tube Holder Repositioned Yes 04/20/22 0350  Prone position No 04/20/22 0350  Cuff Pressure (cm H2O) Clear OR 27-39 CmH2O 04/20/22 0350  Site Condition Dry 04/20/22 0350   Vent end date: 04/20/22 Vent end time: 0814   Evaluation  O2 sats: stable throughout Complications: No apparent complications Patient did tolerate procedure well. Bilateral Breath Sounds: Clear, Rhonchi   Yes Pt passed all required parameters for extubation.  Positive cuff leak present.  Extubated to 4 lpm nasal cannula.  Alert and oriented to time and place.  Coached patient with incentive spirometer pt reached 1500 and will continue to work on exercise throughout the day.  Wyman Songster [facility] 04/20/2022, 8:14 AM

## 2022-04-20 NOTE — Progress Notes (Signed)
Patient preformed NIF and VC with good effort and technique.  NIF >-40 VC 1.36 L

## 2022-04-20 NOTE — Plan of Care (Signed)
  Problem: Education: Goal: Knowledge of General Education information will improve Description: Including pain rating scale, medication(s)/side effects and non-pharmacologic comfort measures Outcome: Progressing   Problem: Health Behavior/Discharge Planning: Goal: Ability to manage health-related needs will improve Outcome: Progressing   Problem: Clinical Measurements: Goal: Ability to maintain clinical measurements within normal limits will improve Outcome: Progressing Goal: Will remain free from infection Outcome: Progressing Goal: Diagnostic test results will improve Outcome: Progressing Goal: Respiratory complications will improve Outcome: Progressing Goal: Cardiovascular complication will be avoided Outcome: Progressing   Problem: Activity: Goal: Risk for activity intolerance will decrease Outcome: Progressing   Problem: Nutrition: Goal: Adequate nutrition will be maintained Outcome: Progressing   Problem: Coping: Goal: Level of anxiety will decrease Outcome: Progressing   Problem: Elimination: Goal: Will not experience complications related to bowel motility Outcome: Progressing Goal: Will not experience complications related to urinary retention Outcome: Progressing   Problem: Pain Managment: Goal: General experience of comfort will improve Outcome: Progressing   Problem: Safety: Goal: Ability to remain free from injury will improve Outcome: Progressing   Problem: Skin Integrity: Goal: Risk for impaired skin integrity will decrease Outcome: Progressing   Problem: Education: Goal: Understanding of cardiac disease, CV risk reduction, and recovery process will improve Outcome: Progressing Goal: Individualized Educational Video(s) Outcome: Progressing   Problem: Activity: Goal: Ability to tolerate increased activity will improve Outcome: Progressing   Problem: Cardiac: Goal: Ability to achieve and maintain adequate cardiovascular perfusion will  improve Outcome: Progressing   Problem: Health Behavior/Discharge Planning: Goal: Ability to safely manage health-related needs after discharge will improve Outcome: Progressing   Problem: Education: Goal: Understanding of CV disease, CV risk reduction, and recovery process will improve Outcome: Progressing Goal: Individualized Educational Video(s) Outcome: Progressing   Problem: Activity: Goal: Ability to return to baseline activity level will improve Outcome: Progressing   Problem: Cardiovascular: Goal: Ability to achieve and maintain adequate cardiovascular perfusion will improve Outcome: Progressing Goal: Vascular access site(s) Level 0-1 will be maintained Outcome: Progressing   Problem: Health Behavior/Discharge Planning: Goal: Ability to safely manage health-related needs after discharge will improve Outcome: Progressing   Problem: Education: Goal: Will demonstrate proper wound care and an understanding of methods to prevent future damage Outcome: Progressing Goal: Knowledge of disease or condition will improve Outcome: Progressing Goal: Knowledge of the prescribed therapeutic regimen will improve Outcome: Progressing Goal: Individualized Educational Video(s) Outcome: Progressing   Problem: Activity: Goal: Risk for activity intolerance will decrease Outcome: Progressing   Problem: Cardiac: Goal: Will achieve and/or maintain hemodynamic stability Outcome: Progressing   Problem: Clinical Measurements: Goal: Postoperative complications will be avoided or minimized Outcome: Progressing   Problem: Respiratory: Goal: Respiratory status will improve Outcome: Progressing   Problem: Skin Integrity: Goal: Wound healing without signs and symptoms of infection Outcome: Progressing Goal: Risk for impaired skin integrity will decrease Outcome: Progressing   Problem: Urinary Elimination: Goal: Ability to achieve and maintain adequate renal perfusion and functioning  will improve Outcome: Progressing

## 2022-04-21 ENCOUNTER — Inpatient Hospital Stay (HOSPITAL_COMMUNITY): Payer: Non-veteran care

## 2022-04-21 DIAGNOSIS — I214 Non-ST elevation (NSTEMI) myocardial infarction: Secondary | ICD-10-CM | POA: Diagnosis not present

## 2022-04-21 DIAGNOSIS — G7000 Myasthenia gravis without (acute) exacerbation: Secondary | ICD-10-CM | POA: Diagnosis not present

## 2022-04-21 DIAGNOSIS — Z951 Presence of aortocoronary bypass graft: Secondary | ICD-10-CM | POA: Diagnosis not present

## 2022-04-21 LAB — GLUCOSE, CAPILLARY
Glucose-Capillary: 101 mg/dL — ABNORMAL HIGH (ref 70–99)
Glucose-Capillary: 108 mg/dL — ABNORMAL HIGH (ref 70–99)
Glucose-Capillary: 113 mg/dL — ABNORMAL HIGH (ref 70–99)
Glucose-Capillary: 115 mg/dL — ABNORMAL HIGH (ref 70–99)
Glucose-Capillary: 121 mg/dL — ABNORMAL HIGH (ref 70–99)
Glucose-Capillary: 125 mg/dL — ABNORMAL HIGH (ref 70–99)
Glucose-Capillary: 127 mg/dL — ABNORMAL HIGH (ref 70–99)
Glucose-Capillary: 134 mg/dL — ABNORMAL HIGH (ref 70–99)

## 2022-04-21 LAB — BASIC METABOLIC PANEL
Anion gap: 9 (ref 5–15)
BUN: 18 mg/dL (ref 8–23)
CO2: 23 mmol/L (ref 22–32)
Calcium: 8.4 mg/dL — ABNORMAL LOW (ref 8.9–10.3)
Chloride: 106 mmol/L (ref 98–111)
Creatinine, Ser: 0.98 mg/dL (ref 0.61–1.24)
GFR, Estimated: >60 mL/min (ref >60)
Glucose, Bld: 122 mg/dL — ABNORMAL HIGH (ref 70–99)
Potassium: 3.7 mmol/L (ref 3.5–5.1)
Sodium: 138 mmol/L (ref 135–145)

## 2022-04-21 LAB — CBC
HCT: 24.9 % — ABNORMAL LOW (ref 39.0–52.0)
Hemoglobin: 7.9 g/dL — ABNORMAL LOW (ref 13.0–17.0)
MCH: 29.3 pg (ref 26.0–34.0)
MCHC: 31.7 g/dL (ref 30.0–36.0)
MCV: 92.2 fL (ref 80.0–100.0)
Platelets: 142 10*3/uL — ABNORMAL LOW (ref 150–400)
RBC: 2.7 MIL/uL — ABNORMAL LOW (ref 4.22–5.81)
RDW: 15.7 % — ABNORMAL HIGH (ref 11.5–15.5)
WBC: 12.4 10*3/uL — ABNORMAL HIGH (ref 4.0–10.5)
nRBC: 0 % (ref 0.0–0.2)

## 2022-04-21 LAB — COOXEMETRY PANEL
Carboxyhemoglobin: 1.9 % — ABNORMAL HIGH (ref 0.5–1.5)
Methemoglobin: <0.7 % (ref 0.0–1.5)
O2 Saturation: 57.8 %
Total hemoglobin: 8.2 g/dL — ABNORMAL LOW (ref 12.0–16.0)

## 2022-04-21 MED ORDER — FUROSEMIDE 10 MG/ML IJ SOLN
20.0000 mg | Freq: Two times a day (BID) | INTRAMUSCULAR | Status: DC
  Administered 2022-04-21 – 2022-04-24 (×7): 20 mg via INTRAVENOUS
  Filled 2022-04-21 (×7): qty 2

## 2022-04-21 MED ORDER — PREDNISONE 10 MG PO TABS
10.0000 mg | ORAL_TABLET | Freq: Every day | ORAL | Status: DC
  Administered 2022-04-21 – 2022-04-30 (×10): 10 mg via ORAL
  Filled 2022-04-21 (×10): qty 1

## 2022-04-21 MED ORDER — ENOXAPARIN SODIUM 40 MG/0.4ML IJ SOSY
40.0000 mg | PREFILLED_SYRINGE | Freq: Every day | INTRAMUSCULAR | Status: DC
  Administered 2022-04-21 – 2022-04-29 (×9): 40 mg via SUBCUTANEOUS
  Filled 2022-04-21 (×9): qty 0.4

## 2022-04-21 MED ORDER — POTASSIUM CHLORIDE 10 MEQ/50ML IV SOLN
10.0000 meq | INTRAVENOUS | Status: AC
  Administered 2022-04-21 (×3): 10 meq via INTRAVENOUS
  Filled 2022-04-21: qty 50

## 2022-04-21 MED ORDER — FAMOTIDINE IN NACL 20-0.9 MG/50ML-% IV SOLN
20.0000 mg | Freq: Two times a day (BID) | INTRAVENOUS | Status: DC
  Administered 2022-04-21 (×2): 20 mg via INTRAVENOUS
  Filled 2022-04-21 (×3): qty 50

## 2022-04-21 NOTE — Progress Notes (Signed)
TCTS DAILY ICU PROGRESS NOTE                   [address]            [place] [address]          [phone]   2 Days Post-Op Procedure(s) (LRB): CORONARY ARTERY BYPASS GRAFTING (CABG) X3 USING LEFT INTERNAL MAMMARY ARTERY (LIMA) AND ENDOSCOPICALLY HARVESTED RIGHT GREATER SAPHENOUS VEIN AND CORONARY ENDARTERECTOMY (N/A) TRANSESOPHAGEAL ECHOCARDIOGRAM (N/A) THYMECTOMY (N/A) CLIPPING OF ATRIAL APPENDAGE USING ATRICURE 40MM ATRICLIP  Total Length of Stay:  LOS: 6 days   Subjective:  Awake with appropriate mental status. No new concerns. Out of bed earlier this morning.   Epi weaned slowly overnight, off since around 6am. VS and cardiac rhythm stable since surgery.   Tolerating po liquids, no BM or flatus  since surgery.  Objective: Vital signs in last 24 hours: Temp:  [99.1 F (37.3 C)-100 F (37.8 C)] 99.1 F (37.3 C) (02/28 0700) Pulse Rate:  [72-109] 94 (02/28 0700) Cardiac Rhythm: Normal sinus rhythm;Bundle branch block (02/27 2000) Resp:  [15-36] 26 (02/28 0700) BP: (98-165)/(57-99) 127/83 (02/28 0700) SpO2:  [90 %-100 %] 91 % (02/28 0700) Arterial Line BP: (58-158)/(48-87) 63/54 (02/27 1930) Weight:  [85.6 kg] 85.6 kg (02/28 0615)  Filed Weights   04/18/22 1010 04/20/22 0500 04/21/22 0615  Weight: 80.2 kg 78.9 kg 85.6 kg    Weight change: 6.7 kg   Hemodynamic parameters for last 24 hours: PAP: (18-39)/(12-33) 34/28 CVP:  [3 mmHg-15 mmHg] 9 mmHg CO:  [3.6 L/min-5.2 L/min] 5 L/min CI:  [1.9 L/min/m2-2.7 L/min/m2] 2.6 L/min/m2  Intake/Output from previous day: 02/27 0701 - 02/28 0700 In: 2139.8 [P.O.:50; I.V.:1317.5; Blood:314; IV Piggyback:458.3] Out: 2195 [Urine:1503; Drains:252; Chest Tube:440]  Intake/Output this shift: No intake/output data recorded.  Current Meds: Scheduled Meds:  acetaminophen  1,000 mg Oral Q6H   Or   acetaminophen (TYLENOL) oral liquid 160 mg/5 mL  1,000 mg Per Tube Q6H   aspirin EC  81 mg Oral Daily    bisacodyl  10 mg Oral Daily   Or   bisacodyl  10 mg Rectal Daily   Chlorhexidine Gluconate Cloth  6 each Topical Daily   clopidogrel  75 mg Oral Daily   docusate sodium  200 mg Oral Daily   enoxaparin (LOVENOX) injection  40 mg Subcutaneous QHS   furosemide  20 mg Intravenous Daily   insulin aspart  0-24 Units Subcutaneous Q4H   mouth rinse  15 mL Mouth Rinse TID PC & HS   pantoprazole  40 mg Oral Daily   predniSONE  10 mg Oral QAC breakfast   sodium chloride flush  3 mL Intravenous Q12H   Continuous Infusions:  sodium chloride Stopped (04/19/22 2140)   sodium chloride     sodium chloride Stopped (04/21/22 0649)   lactated ringers Stopped (04/20/22 2004)   lactated ringers 20 mL/hr at 04/21/22 0700   potassium chloride 50 mL/hr at 04/21/22 0700   PRN Meds:.sodium chloride, midazolam, morphine injection, ondansetron (ZOFRAN) IV, mouth rinse, oxyCODONE, sodium chloride flush, traMADol  General appearance: alert, cooperative, and no distress Neurologic: no focal deficits Heart: RRR,  Lungs: breath sounds clear anterior. CXR with no unexpected changes. CT down to 171m past 12 hours, mediastinal JP 389m12 hours Abdomen: firm but not tender, absent bowel sounds Extremities: SCD's to LE's, all warm and perfused.  Wound: the sternotomy incision is covered with a dry Aquacel dressing  Lab Results: CBC:  Recent Labs    04/20/22 1657 04/21/22 0503  WBC 12.9* 12.4*  HGB 8.7* 7.9*  HCT 26.6* 24.9*  PLT 153 142*    BMET:  Recent Labs    04/20/22 1657 04/21/22 0503  NA 140 138  K 3.6 3.7  CL 107 106  CO2 22 23  GLUCOSE 144* 122*  BUN 15 18  CREATININE 1.14 0.98  CALCIUM 8.1* 8.4*     CMET: Lab Results  Component Value Date   WBC 12.4 (H) 04/21/2022   HGB 7.9 (L) 04/21/2022   HCT 24.9 (L) 04/21/2022   PLT 142 (L) 04/21/2022   GLUCOSE 122 (H) 04/21/2022   CHOL 216 (H) 11/10/2021   TRIG 202 (H) 11/10/2021   HDL 58 11/10/2021   LDLCALC 123 (H) 11/10/2021   ALT 14  04/18/2022   AST 27 04/18/2022   NA 138 04/21/2022   K 3.7 04/21/2022   CL 106 04/21/2022   CREATININE 0.98 04/21/2022   BUN 18 04/21/2022   CO2 23 04/21/2022   TSH 2.32 11/25/2021   INR 1.4 (H) 04/19/2022   HGBA1C 5.5 04/18/2022      PT/INR:  Recent Labs    04/19/22 1555  LABPROT 16.8*  INR 1.4*    Radiology: No results found.   Assessment/Plan: S/P Procedure(s) (LRB): CORONARY ARTERY BYPASS GRAFTING (CABG) X3 USING LEFT INTERNAL MAMMARY ARTERY (LIMA) AND ENDOSCOPICALLY HARVESTED RIGHT GREATER SAPHENOUS VEIN AND CORONARY ENDARTERECTOMY (N/A) TRANSESOPHAGEAL ECHOCARDIOGRAM (N/A) THYMECTOMY (N/A) CLIPPING OF ATRIAL APPENDAGE USING ATRICURE 40MM ATRICLIP  -POD 2 CABG x 3 with coronary endarterectomy to the OM for MVCAD presenting with unstable angina and normal biventricular function. Stable BP and cardiac rhythm, now off epi. On ASA, avoiding statin and BB due to h/o MG . ( He was on Toprol Xl 25 prior to surgery.) On Plavix for coronary endarterectomy.  CT drainage is tapering off, will remove the pleural and mediastinal chest tubes and leave the JP.   -Myasthenia Gravis- s/p thymectomy at time of CABG. Managed with prednisone '10mg'$  daily prior to admission. Has received solu-medrol 50 q8h x 3 of 3 scheduled doses, re-start the daily prednisone today. Breathing comfortably since extubation with reasonable resp mechanics. Will need to avoid Mg++ post op due to myasthenia gravis and other medications as listed in the neuro consult 2/23.   -Expected acute blood loss anemia - transfused 1 unit yesterday for Hct 21, Monitor  -RENAL- normal function at baseline. Good UO, current Wt is up 5kg from yesterday but this is not reflected in the I&O  -ENDO-no h/o DM, mild hyperglycemia likely related to the steroids. Continue to monitor, SSI.   -GI- abd firm, minimal bowel sounds , continue clears later, watch for dysphagia.   -DVT PPX- SCD's in place, start the enoxaparin today.  Mobilize as able.    Antony Odea, PA-C 04/21/2022 7:42 AM

## 2022-04-21 NOTE — Progress Notes (Signed)
     [address]       [place] [address]             [phone]       EVENING ROUNDS  POD #2 SP CABG Has been ambulating and doing well No issues

## 2022-04-21 NOTE — Progress Notes (Signed)
TCTS Progress Note: 2 Days Post-Op Procedure(s) (LRB): CORONARY ARTERY BYPASS GRAFTING (CABG) X3 USING LEFT INTERNAL MAMMARY ARTERY (LIMA) AND ENDOSCOPICALLY HARVESTED RIGHT GREATER SAPHENOUS VEIN AND CORONARY ENDARTERECTOMY (N/A) TRANSESOPHAGEAL ECHOCARDIOGRAM (N/A) THYMECTOMY (N/A) CLIPPING OF ATRIAL APPENDAGE USING ATRICURE 40MM ATRICLIP  LOS: 6 days   Pod 2  Cab/OM Endart/Atriclip/thymectomy Off epi, no more gtts are on Dc tubes Leave bulb in  Leave wires in  Dc swan Is on ASA/plavix (coronary endart - would leave on for at least 1 yr if not lifelong) On BB, leaving statin off as can interact with myasthenia. He takes one at home and will restart then.   Tomorrow consider bulb out, transfer to floor Likely DC in 3 days.         Latest Ref Rng & Units 04/21/2022    5:03 AM 04/20/2022    4:57 PM 04/20/2022    7:51 AM  CBC  WBC 4.0 - 10.5 K/uL 12.4  12.9    Hemoglobin 13.0 - 17.0 g/dL 7.9  8.7  7.1   Hematocrit 39.0 - 52.0 % 24.9  26.6  21.0   Platelets 150 - 400 K/uL 142  153         Latest Ref Rng & Units 04/21/2022    5:03 AM 04/20/2022    4:57 PM 04/20/2022    7:51 AM  CMP  Glucose 70 - 99 mg/dL 122  144    BUN 8 - 23 mg/dL 18  15    Creatinine 0.61 - 1.24 mg/dL 0.98  1.14    Sodium 135 - 145 mmol/L 138  140  140   Potassium 3.5 - 5.1 mmol/L 3.7  3.6  4.3   Chloride 98 - 111 mmol/L 106  107    CO2 22 - 32 mmol/L 23  22    Calcium 8.9 - 10.3 mg/dL 8.4  8.1      ABG    Component Value Date/Time   PHART 7.339 (L) 04/20/2022 0751   PCO2ART 30.0 (L) 04/20/2022 0751   PO2ART 153 (H) 04/20/2022 0751   HCO3 16.1 (L) 04/20/2022 0751   TCO2 17 (L) 04/20/2022 0751   ACIDBASEDEF 9.0 (H) 04/20/2022 0751   O2SAT 57.8 04/21/2022 0503

## 2022-04-21 NOTE — Progress Notes (Signed)
NIF: -30 VC: .96   Pt. Performed with great effort

## 2022-04-21 NOTE — Discharge Instructions (Addendum)
PLEASE TAKE AMLODIPINE WHILE ON ANTIBIOTICS.... THEN DISCONTINUE AND RESUME ALTACE FOR BP CONTROL  Discharge Instructions:  1. You may shower, please wash incisions daily with soap and water and keep dry.  If you wish to cover wounds with dressing you may do so but please keep clean and change daily.  No tub baths or swimming until incisions have completely healed.  If your incisions become red or develop any drainage please call our office at [phone]  2. No Driving until cleared by Dr. Binnie Kand office and you are no longer using narcotic pain medications  3. Monitor your weight daily.. Please use the same scale and weigh at same time... If you gain 5-10 lbs in 48 hours with associated lower extremity swelling, please contact our office at [phone]  4. Fever of 101.5 for at least 24 hours with no source, please contact our office at [phone]  5. Activity- up as tolerated, please walk at least 3 times per day.  Avoid strenuous activity, no lifting, pushing, or pulling with your arms over 8-10 lbs for a minimum of 6 weeks  6. If any questions or concerns arise, please do not hesitate to contact our office at [phone]

## 2022-04-21 NOTE — Progress Notes (Signed)
Patient preformed NIF and VC fairly well due to chest pain.  RN aware.  NIF >-20 VC 0.9 L

## 2022-04-21 NOTE — Discharge Summary (Addendum)
[address]       [place] [address]             [phone]    Physician Discharge Summary  Patient ID: Adrian Bell MRN: [id number] DOB/AGE: [birth year] 77 y.o.  Admit date: 04/15/2022 Discharge date: 04/30/2022  Admission Diagnoses:  Patient Active Problem List   Diagnosis Date Noted   NSTEMI (non-ST elevated myocardial infarction) ([place]) 04/15/2022   STEMI (ST elevation myocardial infarction) [facility])    Kidney stones    Dysphagia    Angina pectoris ([facility] [place]) 12/08/2018   CAD in native artery 08/01/2018   Hyperlipidemia 07/24/2018   Hypertension 07/24/2018   Acute MI, inferior wall ([facility]) 07/21/2018   Long-term corticosteroid use 03/08/2018   Myasthenia gravis ([place]) 05/03/2017   Discharge Diagnoses:  Patient Active Problem List   Diagnosis Date Noted   S/P thymectomy 04/19/2022   S/P CABG x 3 04/19/2022   NSTEMI (non-ST elevated myocardial infarction) ([facility]) 04/15/2022   STEMI (ST elevation myocardial infarction) ([place])    Kidney stones    Dysphagia    Angina pectoris ([facility]) 12/08/2018   CAD in native artery 08/01/2018   Hyperlipidemia 07/24/2018   Hypertension 07/24/2018   Acute MI, inferior wall ([facility]) 07/21/2018   Long-term corticosteroid use 03/08/2018   Myasthenia gravis ([place]) 05/03/2017   Discharged Condition: good  History of Present Illness:  The patient is a 77 year old male who presented to [facility] emergency department with left-sided chest pain.  Pain radiates to left arm with minimal substernal discomfort.  Reportedly he has been having some symptoms for the past several months.  The pain on today's date was described as severe.  He took 3 sublingual nitroglycerin which provided pain relief.  A CTA was negative for dissection.  Patient recently saw Dr. Bettina Gavia on 03/22/2022 for symptoms of shortness of breath and echocardiogram revealed normal LV and RV function, grade 1 diastolic dysfunction and no significant valvular disease.  Cardiac risk  factors include history of CAD, HLD, and hypertension.  He previously underwent coronary angiogram and October 2020 which showed complex three-vessel CAD with severely calcified arteries.  This included a 70% ostial LAD, segmental 75% mid LAD, occluded first and second diagonals, 50% ostial and mid circumflex lesions, 70% ostial large OM lesion and a 70% ostial RCA, 80% distal RCA stenosis at the bifurcation.  He was transferred to [facility] with the diagnosis of unstable angina.  He has previously declined CABG in the past.  Additional significant medical history includes a normocytic anemia and myasthenia gravis for which she is on prednisone.  He has no history of cigarette use but has used smokeless tobacco.  A repeat cardiac catheterization was performed today and these results are described below.  The primary change in anatomy from the previous study in 2020 is in the 99% lesion in the obtuse marginal.  We are asked to see the patient in CT surgical consultation for consideration of CABG.  Hospital Course:  The patient was evaluated by Dr. Tenny Craw who felt the patient would be a surgical candidate.  The risks and benefits of the procedure were explained to the patient and was agreeable to proceed.  Neurology consult was obtained prior to surgery for anesthesia recommendations with his Myasthenia Gravis.  He was taken to the operating room on 04/19/2022.  He underwent Thymectomy, CABG x 3 utillizing LIMA to LAD, SVG to OM with endarterectomy, and SVG to Distal RCA.  He also underwent endscopic harvest  of greater saphenous vein from his right leg and Clipping of LA Appendage.  He tolerated the procedure without difficulty and was taken to the SICU in stable condition. The critical care team assisted with vent management and early pos-op care. The patient was left on the ventilator electively overnight given his history of myasthenia gravis. He remained stable hemodynamically. The FiO2 and vent support were  weaned and he was extubated using standard protocols on post-op day 1. The epinephrine was weaned off slowly. He was treated with IV stress-dose steroids for 24 hours.  His chest tubes were removed without difficulty.  He was not started on Beta blocker therapy with underlying Myasthenia Gravis.  He developed post operative blood loss anemia and required transfusion of a unit of packed cells.  His repeat hemoglobin level was 7.9.  Chest x-ray showed bilateral pleural effusions/atelectasis. He developed a mild ileus postoperatively.  He was kept on a clear liquid diet until he had return of bowel function.  He was able to move his bowels multiple times after Miralax.  He developed a fever and elevated white count.  Blood cultures were obtained and showed growth Enterobacter. He had a UA which was negative for UTI.  Surgical incisions showed no evidence of infection.  He was tachycardic and overall felt unwell and started on empiric ABX.  Echocardiogram was obtained and showed normal EF and no abnormalities.  He was stable for transfer to the progressive care unit on 04/28/2022.  Final blood culture results show with final results showing Hafnia Alvei.  ID consult was obtained and they transitioned to Ertapenem.  Sensitivities revealed sensitivity to Bactrim DS.  He was transitioned to an oral regimen for 14 days of coverage.  The patient is ambulating.  His surgical incisions are healing without evidence of infection.  He is medically stable for discharge home today.  Consults: pulmonary/intensive care and neurology  Significant Diagnostic Studies: angiography:     Prox LAD to Mid LAD lesion is 75% stenosed.   Ost LAD to Prox LAD lesion is 75% stenosed.   Prox Cx to Mid Cx lesion is 50% stenosed.   Ost RCA to Prox RCA lesion is 70% stenosed.   Dist RCA lesion is 80% stenosed with 80% stenosed side branch in Ost RPDA.   Ost 1st Diag lesion is 100% stenosed.   Ost 2nd Diag to 2nd Diag lesion is 100%  stenosed.   Ost 1st Mrg lesion is 70% stenosed.   1st Mrg lesion is 99% stenosed.   Ost Cx to Prox Cx lesion is 70% stenosed.   Mid RCA stent is patent.   The left ventricular systolic function is normal.   LV end diastolic pressure is normal.   The left ventricular ejection fraction is 55-65% by visual estimate.   There is no aortic valve stenosis.   Severe calcific three-vessel coronary artery disease.  The change in anatomy from 2020 is the 99% lesion in the OM.  There is proximal disease in the OM at the bifurcation with the circumflex.  Will get a surgical evaluation for CABG.  His symptom burden has been increasing of late so we will restart heparin 2 hours after the TR band has been removed.  Treatments: surgery:   OPERATIVE NOTE: Patient Name: Adrian Bell Date of Birth: 09/13/1945 Date of Operation: @Date @   PRE-OPERATIVE DIAGNOSIS: Coronary artery disease Myasthenia Gravis with bulbar symptomatology History of PCI   POST-OPERATIVE DIAGNOSIS: Same   OPERATION: CABG x 3 (LIMA -  LAD, SVG - OM, SVG - PDA) Coronary endarterectomy, left circumflex Total Thymectomy Endoscopic saphenous vein harvest   SURGEON: Pierre Bali Enter MD   ASSISTANT: Ellwood Handler PA-C   PATHOLOGY:  Collected: 04/19/22 0952  Lab status: Final-Edited  Resulting lab: [facility]  Value: SURGICAL PATHOLOGY  CASE: [id number]  PATIENT: Adrian Bell  Surgical Pathology Report      Clinical History: coronary artery disease (cm)      FINAL MICROSCOPIC DIAGNOSIS:   A. THYMUS, RESECTION:  - Compatible with thymic tissue with marked atrophy   B. CORONARY ENDARTERECTOMY:  - Connective tissue with fibrocalcific degeneration    Discharge Exam: Blood pressure (!) 148/71, pulse 84, temperature 97.9 F (36.6 C), temperature source Oral, resp. rate 20, height 5\' 6"  (1.676 m), weight 81.4 kg, SpO2 98 %.  General appearance: alert, cooperative, and no distress Heart: regular rate  and rhythm Lungs: clear to auscultation bilaterally Abdomen: soft, non-tender; bowel sounds normal; no masses,  no organomegaly Extremities: edema trace Wound: clean and dry   Discharge Medications:  The patient has been discharged on:   1.Beta Blocker:  Yes [ X  ]                              No   [   ]                              If No, reason:  2.Ace Inhibitor/ARB: Yes [  ]                                     No  [ x   ]                                     If No, reason: interaction with discharge antibiotic, will be resumed once antibiotic regimen is complete  3.Statin:   Yes Valu.Nieves   ]                  No  [   ]                  If No, reason:  4.Ecasa:  Yes  [ X  ]                  No   [   ]                  If No, reason:  Patient had ACS upon admission: Yes  Plavix/P2Y12 inhibitor: Yes [Y] + carotid endarterectomy                                      No  [   ]     Discharge Instructions     Amb Referral to Cardiac Rehabilitation   Complete by: As directed    Diagnosis:  NSTEMI CABG     CABG X ___: 3   After initial evaluation and assessments completed: Virtual Based Care may be provided alone or in conjunction with Phase 2 Cardiac Rehab based on patient barriers.: Yes   Intensive Cardiac Rehabilitation (  ICR) [place] location only OR Traditional Cardiac Rehabilitation (TCR) *If criteria for ICR are not met will enroll in TCR [facility] only): Yes      Allergies as of 04/30/2022       Reactions   Zetia [ezetimibe] Other (See Comments)   Weakness, shortness of breath   Demerol [meperidine Hcl]    Statins Other (See Comments)   myalgias        Medication List     STOP taking these medications    isosorbide mononitrate 60 MG 24 hr tablet Commonly known as: IMDUR   metoprolol succinate 25 MG 24 hr tablet Commonly known as: TOPROL-XL   ramipril 5 MG capsule Commonly known as: ALTACE   ranolazine 500 MG 12 hr tablet Commonly known as: RANEXA        TAKE these medications    acetaminophen 325 MG tablet Commonly known as: TYLENOL Take 2 tablets (650 mg total) by mouth every 4 (four) hours as needed for fever or mild pain.   amLODipine 5 MG tablet Commonly known as: NORVASC Take 1 tablet (5 mg total) by mouth daily. Please take while using Bactrim DS (antibiotic) then discontinue and resume Altace   aspirin 81 MG chewable tablet Chew 1 tablet (81 mg total) by mouth daily.   CALCIUM 1200 PO Take 1 tablet by mouth daily.   clopidogrel 75 MG tablet Commonly known as: PLAVIX Take 1 tablet (75 mg total) by mouth daily. Start taking on: May 01, 2022   ferrous gluconate 324 MG tablet Commonly known as: FERGON Take 1 tablet by mouth 3 (three) times a week. Monday, Wednesday AND Friday   metoprolol tartrate 25 MG tablet Commonly known as: LOPRESSOR Take 1 tablet (25 mg total) by mouth 2 (two) times daily.   Mintox 200-200-20 MG/5ML suspension Generic drug: alum & mag hydroxide-simeth Take 30 mLs by mouth as needed for indigestion or heartburn.   nitroGLYCERIN 0.4 MG SL tablet Commonly known as: NITROSTAT Place 1 tablet (0.4 mg total) under the tongue every 5 (five) minutes as needed for chest pain.   Pitavastatin Calcium 2 MG Tabs Take 1 tablet (2 mg total) by mouth daily. What changed:  when to take this additional instructions   predniSONE 10 MG tablet Commonly known as: DELTASONE Take 1 tablet (10 mg total) by mouth daily with breakfast.   sulfamethoxazole-trimethoprim 800-160 MG tablet Commonly known as: BACTRIM DS Take 2 tablets by mouth every 12 (twelve) hours.   traMADol 50 MG tablet Commonly known as: ULTRAM Take 1 tablet (50 mg total) by mouth every 6 (six) hours as needed for moderate pain.   VITAMIN B-12 PO Take 1 tablet by mouth daily.               Durable Medical Equipment  (From admission, onward)           Start     Ordered   04/29/22 1120  For home use only DME Walker rolling   Once       Question Answer Comment  Walker: With 5 Inch Wheels   Patient needs a walker to treat with the following condition Weakness generalized      04/29/22 1119            Follow-up Information     Enter, Pierre Bali, MD Follow up on 05/20/2022.   Specialties: Cardiothoracic Surgery, Cardiology Why: Appointment is at 10:00 Contact information: [place] [address] [phone]  [facility] Follow up on 05/20/2022.   Why: Please get CXR at 9:00 at [facility] prior to your appointment at Dr. Rhea Bleacher office Contact information: [place]        Richardo Priest, MD Follow up on 05/28/2022.   Specialties: Cardiology, Radiology Why: Appointment is at 10:00 Contact information: [place] [address] [phone]         [facility] Follow up on 05/04/2022.   Specialty: Cardiothoracic Surgery Why: Appointmet is at 2:00 Contact information: [place], [place] [phone]                Signed:  Ellwood Handler, PA-C  04/30/2022, 12:20 PM

## 2022-04-21 NOTE — Progress Notes (Signed)
NIF -40 VC 1.14L Done with great effort

## 2022-04-21 NOTE — Progress Notes (Signed)
NIF: -30  VC: 1.18  With great patient effort

## 2022-04-21 NOTE — Progress Notes (Signed)
NAME:  Adrian Bell, MRN:  [id number], DOB:  1946/02/04, LOS: 6 ADMISSION DATE:  04/15/2022, CONSULTATION DATE:  2/26 REFERRING MD:  Tenny Craw, CHIEF COMPLAINT:  post-CABG   History of Present Illness:  Mr. Adrian Bell is a 77 y/o gentleman with a history of CAD s/p previous PCI who presented to [facility] from [place] with an NSTEMI. He presented with left-sided chest pain that was worse than his typical intermittent left chest pain, requiring 3 SL NTG to relieve his pain. He recently has endorsed exertional dyspnea as well. He underwent LHC upon transfer to [facility] and was found to have severe 3-vessel disease. He was evaluated by Cardiothoracic surgery for consideration of bypass surgery. At baseline he is active and independent. He has been managed with his baseline cardiac medications and heparin until today. EF is preserved. Cardiac risk factors include previous use of smokeless tobacco, HTN, HLD, age, gender. He underwent CABG x 3, LCX end-arterectomy, LAA clipping, and empiric thymectomy given history of MG.  Post-operatively he is on epinephrine. Last dose of paralytics was noon today; before leaving the OR he had only 1/4 twitches. Total rocuronium dose today '130mg'$ .   He has a history of MG which was diagnosed in 2018. His symptoms include diplopia, dysarthria, dysphagia. His maintenance regimen for MG is porednisone '10mg'$  daily with 1 previous flare since diagnosis. He has had an adverse reaction to IVIG in the past and was discontinued from this maintenance regimen in 12/2021.   Pertinent  Medical History  CAD HTN HLD MG on prednisone '10mg'$  daily nephrolithiasis  Significant Hospital Events: Including procedures, antibiotic start and stop dates in addition to other pertinent events   2/23 admission, LHC 2/26 CABG x 3, Lcx end-arterectomy, LAA clip, thymectomy  Interim History / Subjective:  Dropping NIF and VC this morning. Denies trouble swallowing.  Off epinephrine.  He is having some nausea.   Objective    Blood pressure 127/83, pulse 94, temperature 99.1 F (37.3 C), resp. rate (!) 26, height '5\' 6"'$  (1.676 m), weight 85.6 kg, SpO2 91 %. PAP: (18-39)/(12-33) 34/28 CVP:  [3 mmHg-15 mmHg] 9 mmHg CO:  [3.6 L/min-5.2 L/min] 5 L/min CI:  [1.9 L/min/m2-2.7 L/min/m2] 2.6 L/min/m2      Intake/Output Summary (Last 24 hours) at 04/21/2022 0716 Last data filed at 04/21/2022 0700 Gross per 24 hour  Intake 2139.78 ml  Output 2195 ml  Net -55.22 ml    Filed Weights   04/18/22 1010 04/20/22 0500 04/21/22 0615  Weight: 80.2 kg 78.9 kg 85.6 kg    Examination: General: elderly man sitting up in bed in NAD HENT: [place]/AT, eyes anicteric Lungs: breathing comfortably on [place], CTAB, reduced basilar breath sounds. Cardiovascular: S1S2, RRR Abdomen: soft, NT Extremities: mild ankle edema, no cyanosis Neuro: Awake, alert. Normal speech.   GU:  foley with clear yellow urine  Chest tube output: 440cc +  bulb drain 250cc over past 24 hrs.  CXR personally reviewed>  LLL infiltrate vs effusion, chest tubes remain in place  [place] 58% BUN 18 Cr  0.98 H/H 7.9/24.9 Platelets 142   Resolved Hospital Problem list     Assessment & Plan:  3-vessel CAD with NSTEMI, s/p CABG x 3, end-arterectomy of LCX, LAA clipping, thymectomy -post-op pain control per protocol -advance mobility -[place], chest tubes out today. Leave bulb drain. -DAPT for end-arterectomy -Resume pitvastatin at discharge; not available in this pharmacy.  -Hold metoprolol until tomorrow given that epinephrine was recently weaned off. -keep pacing wires.  -avoid empiric magnesium  IV due to MG  At risk for adrenal insufficiency due to chronic steroid use -back to PTA prednisone '10mg'$  daily -monitor for adrenal insufficiency -if he has a flare, no IVIG; would need PLEX per neurology  MG; not acutely exacerbated -NIF and VC q4h, close monitoring for respiratory muscle weakness-- so far stable. Con't monitoring.  -con't monitoring for dysarthria  and dysphagia  Post-op vent management -pulmonary hygiene -monitor NIF & VS -wean FiO2  Hyperglycemia -basal bolus insulin  Post-op ABLA anemia-expected, stable Consumptive thrombocytopenia -monitor bulb drain output -transfuse for Hb <7 or hemodynamically significant bleeding  GERD -con't PPI -adding back H2blocker   Best Practice (right click and "Reselect all SmartList Selections" daily)   Diet/type: Regular consistency (see orders) DVT prophylaxis: LMWH GI prophylaxis: H2B and PPI Lines: Central line and Arterial Line Foley:  Yes, and it is still needed Code Status:  full code Last date of multidisciplinary goals of care discussion [ per primary ]  Labs   CBC: Recent Labs  Lab 04/19/22 1555 04/19/22 2104 04/20/22 0345 04/20/22 0751 04/20/22 1657 04/21/22 0503  WBC 8.8 7.0 10.5  --  12.9* 12.4*  HGB 9.1* 7.7* 7.3*  7.1* 7.1* 8.7* 7.9*  HCT 28.2* 22.8* 22.0*  21.0* 21.0* 26.6* 24.9*  MCV 93.4 92.3 92.4  --  92.0 92.2  PLT 148* 129* 163  --  153 142*     Basic Metabolic Panel: Recent Labs  Lab 04/19/22 0558 04/19/22 0816 04/19/22 1346 04/19/22 1554 04/19/22 2104 04/20/22 0345 04/20/22 0751 04/20/22 1657 04/21/22 0503  NA 139   < > 143   < > 142 139  143 140 140 138  K 4.2   < > 4.3   < > 4.2 4.2  4.3 4.3 3.6 3.7  CL 105   < > 109  --  112* 109  --  107 106  CO2 22  --   --   --  21* 19*  --  22 23  GLUCOSE 94   < > 155*  --  142* 175*  --  144* 122*  BUN 19   < > 16  --  15 15  --  15 18  CREATININE 1.20   < > 0.60*  --  0.90 1.08  --  1.14 0.98  CALCIUM 8.8*  --   --   --  7.8* 7.7*  --  8.1* 8.4*  MG  --   --   --   --  2.2 2.1  --  2.0  --    < > = values in this interval not displayed.    GFR: Estimated Creatinine Clearance: 65.8 mL/min (by C-G formula based on SCr of 0.98 mg/dL). Recent Labs  Lab 04/19/22 2104 04/20/22 0345 04/20/22 1657 04/21/22 0503  WBC 7.0 10.5 12.9* 12.4*     ABG    Component Value Date/Time   PHART  7.339 (L) 04/20/2022 0751   PCO2ART 30.0 (L) 04/20/2022 0751   PO2ART 153 (H) 04/20/2022 0751   HCO3 16.1 (L) 04/20/2022 0751   TCO2 17 (L) 04/20/2022 0751   ACIDBASEDEF 9.0 (H) 04/20/2022 0751   O2SAT 57.8 04/21/2022 0503     Critical care time:     Julian Hy, DO 04/21/22 9:18 AM [facility]  For contact information, see Amion. If no response to pager, please call PCCM consult pager. After hours, 7PM- 7AM, please call Elink.

## 2022-04-22 ENCOUNTER — Telehealth: Payer: Self-pay | Admitting: Cardiology

## 2022-04-22 ENCOUNTER — Inpatient Hospital Stay (HOSPITAL_COMMUNITY): Payer: Non-veteran care

## 2022-04-22 DIAGNOSIS — Z951 Presence of aortocoronary bypass graft: Secondary | ICD-10-CM | POA: Diagnosis not present

## 2022-04-22 DIAGNOSIS — I214 Non-ST elevation (NSTEMI) myocardial infarction: Secondary | ICD-10-CM | POA: Diagnosis not present

## 2022-04-22 DIAGNOSIS — G7000 Myasthenia gravis without (acute) exacerbation: Secondary | ICD-10-CM | POA: Diagnosis not present

## 2022-04-22 LAB — TYPE AND SCREEN
ABO/RH(D): O POS
Antibody Screen: NEGATIVE
Unit division: 0
Unit division: 0
Unit division: 0
Unit division: 0

## 2022-04-22 LAB — CBC
HCT: 26.2 % — ABNORMAL LOW (ref 39.0–52.0)
Hemoglobin: 8.4 g/dL — ABNORMAL LOW (ref 13.0–17.0)
MCH: 29.9 pg (ref 26.0–34.0)
MCHC: 32.1 g/dL (ref 30.0–36.0)
MCV: 93.2 fL (ref 80.0–100.0)
Platelets: 176 10*3/uL (ref 150–400)
RBC: 2.81 MIL/uL — ABNORMAL LOW (ref 4.22–5.81)
RDW: 15 % (ref 11.5–15.5)
WBC: 15.4 10*3/uL — ABNORMAL HIGH (ref 4.0–10.5)
nRBC: 0.3 % — ABNORMAL HIGH (ref 0.0–0.2)

## 2022-04-22 LAB — BPAM RBC
Blood Product Expiration Date: 202403282359
Blood Product Expiration Date: 202403282359
Blood Product Expiration Date: 202403282359
Blood Product Expiration Date: 202403282359
ISSUE DATE / TIME: 202402260838
ISSUE DATE / TIME: 202402260838
ISSUE DATE / TIME: 202402270908
Unit Type and Rh: 5100
Unit Type and Rh: 5100
Unit Type and Rh: 5100
Unit Type and Rh: 5100

## 2022-04-22 LAB — BASIC METABOLIC PANEL
Anion gap: 11 (ref 5–15)
BUN: 27 mg/dL — ABNORMAL HIGH (ref 8–23)
CO2: 24 mmol/L (ref 22–32)
Calcium: 8.5 mg/dL — ABNORMAL LOW (ref 8.9–10.3)
Chloride: 104 mmol/L (ref 98–111)
Creatinine, Ser: 1.21 mg/dL (ref 0.61–1.24)
GFR, Estimated: >60 mL/min (ref >60)
Glucose, Bld: 178 mg/dL — ABNORMAL HIGH (ref 70–99)
Potassium: 4 mmol/L (ref 3.5–5.1)
Sodium: 139 mmol/L (ref 135–145)

## 2022-04-22 LAB — GLUCOSE, CAPILLARY
Glucose-Capillary: 107 mg/dL — ABNORMAL HIGH (ref 70–99)
Glucose-Capillary: 159 mg/dL — ABNORMAL HIGH (ref 70–99)
Glucose-Capillary: 122 mg/dL — ABNORMAL HIGH (ref 70–99)
Glucose-Capillary: 137 mg/dL — ABNORMAL HIGH (ref 70–99)
Glucose-Capillary: 158 mg/dL — ABNORMAL HIGH (ref 70–99)
Glucose-Capillary: 133 mg/dL — ABNORMAL HIGH (ref 70–99)

## 2022-04-22 MED ORDER — FAMOTIDINE 20 MG PO TABS
20.0000 mg | ORAL_TABLET | Freq: Two times a day (BID) | ORAL | Status: DC
  Administered 2022-04-22 – 2022-04-26 (×9): 20 mg via ORAL
  Filled 2022-04-22 (×9): qty 1

## 2022-04-22 MED ORDER — METOPROLOL SUCCINATE ER 25 MG PO TB24
12.5000 mg | ORAL_TABLET | Freq: Every day | ORAL | Status: DC
  Administered 2022-04-22 – 2022-04-25 (×4): 12.5 mg via ORAL
  Filled 2022-04-22 (×4): qty 1

## 2022-04-22 MED ORDER — ORAL CARE MOUTH RINSE: 15.0000 mL | OROMUCOSAL | Status: DC | PRN

## 2022-04-22 MED FILL — Lidocaine HCl Local Preservative Free (PF) Inj 2%: INTRAMUSCULAR | Qty: 14 | Status: AC

## 2022-04-22 MED FILL — Heparin Sodium (Porcine) Inj 1000 Unit/ML: Qty: 1000 | Status: AC

## 2022-04-22 MED FILL — Potassium Chloride Inj 2 mEq/ML: INTRAVENOUS | Qty: 40 | Status: AC

## 2022-04-22 NOTE — Progress Notes (Signed)
NAME:  Adrian Bell, MRN:  [id number], DOB:  08/26/1945, LOS: 7 ADMISSION DATE:  04/15/2022, CONSULTATION DATE:  2/26 REFERRING MD:  Tenny Craw, CHIEF COMPLAINT:  post-CABG   History of Present Illness:  Adrian Bell is a 77 y/o gentleman with a history of CAD s/p previous PCI who presented to [facility] from [place] with an NSTEMI. He presented with left-sided chest pain that was worse than his typical intermittent left chest pain, requiring 3 SL NTG to relieve his pain. He recently has endorsed exertional dyspnea as well. He underwent LHC upon transfer to [facility] and was found to have severe 3-vessel disease. He was evaluated by Cardiothoracic surgery for consideration of bypass surgery. At baseline he is active and independent. He has been managed with his baseline cardiac medications and heparin until today. EF is preserved. Cardiac risk factors include previous use of smokeless tobacco, HTN, HLD, age, gender. He underwent CABG x 3, LCX end-arterectomy, LAA clipping, and empiric thymectomy given history of MG.  Post-operatively he is on epinephrine. Last dose of paralytics was noon today; before leaving the OR he had only 1/4 twitches. Total rocuronium dose today '130mg'$ .   He has a history of MG which was diagnosed in 2018. His symptoms include diplopia, dysarthria, dysphagia. His maintenance regimen for MG is porednisone '10mg'$  daily with 1 previous flare since diagnosis. He has had an adverse reaction to IVIG in the past and was discontinued from this maintenance regimen in 12/2021.   Pertinent  Medical History  CAD HTN HLD MG on prednisone '10mg'$  daily nephrolithiasis  Significant Hospital Events: Including procedures, antibiotic start and stop dates in addition to other pertinent events   2/23 admission, LHC 2/26 CABG x 3, Lcx end-arterectomy, LAA clip, thymectomy  Interim History / Subjective:  Adrian Bell is feeling well and denies complaints.  No SOB.  Objective   Blood pressure (!) 152/62, pulse (!) 110,  temperature 98.6 F (37 C), temperature source Axillary, resp. rate (!) 27, height '5\' 6"'$  (1.676 m), weight 85.9 kg, SpO2 94 %. PAP: (31-32)/(24-28) 31/27 CVP:  [7 mmHg-14 mmHg] 14 mmHg      Intake/Output Summary (Last 24 hours) at 04/22/2022 0725 Last data filed at 04/22/2022 0700 Gross per 24 hour  Intake 1325.17 ml  Output 1169 ml  Net 156.17 ml    Filed Weights   04/20/22 0500 04/21/22 0615 04/22/22 0500  Weight: 78.9 kg 85.6 kg 85.9 kg    Examination: General: elderly man lying in bed in NAD HENT: [place]/AT, eyes anicteric Lungs: breathing comfortably on RA, CTAB   Cardiovascular S1S2, RRR when resting Abdomen: soft, NT Extremities: minimal ankle edema, no cyanosis Neuro: awake, alert, moving all extremities. Speaking in full sentences. Derm: warm, dry  Bulb drain output: 27cc over past 24 hrs.  CXR personally reviewed>  LLL atelectasis vs effusion  BUN 27 Cr 1.21 WBC 15.4 H/H 8.4/26.2 Platelets 176  Resolved Hospital Problem list     Assessment & Plan:  3-vessel CAD with NSTEMI, s/p CABG x 3, end-arterectomy of LCX, LAA clipping, thymectomy -post-op pain control -metoprolol -start statin at discharge -DAPT -bulb drain management per TCTS -d/c CVC -con't pacing wires per TCTS -avoid IV Mg+ due to MG  At risk for adrenal insufficiency due to chronic steroid use -con't PTA prednisone   MG; not acutely exacerbated -NIF and VC daily  -monitor for dysphagia and dysarthria  Post-op vent management -pulmonary hygiene   -monitor NIF & VS daily  Hyperglycemia -SSI PRN  Post-op ABLA  anemia- expected, stable Consumptive thrombocytopenia- resolved -monitor drain output -transfuse for Hb< 7 or hemodynamically significant bleeding  GERD -PPI, added back H2 blocker -can try to deescalate    Transferring out of ICU later today. PCCM will be available as needed.  Best Practice (right click and "Reselect all SmartList Selections" daily)   Diet/type:  Regular consistency (see orders) DVT prophylaxis: LMWH GI prophylaxis: H2B and PPI Lines: N/A Foley:  Yes, and it is still needed Code Status:  full code Last date of multidisciplinary goals of care discussion [ per primary ]  Labs   CBC: Recent Labs  Lab 04/19/22 1555 04/19/22 2104 04/20/22 0345 04/20/22 0751 04/20/22 1657 04/21/22 0503  WBC 8.8 7.0 10.5  --  12.9* 12.4*  HGB 9.1* 7.7* 7.3*  7.1* 7.1* 8.7* 7.9*  HCT 28.2* 22.8* 22.0*  21.0* 21.0* 26.6* 24.9*  MCV 93.4 92.3 92.4  --  92.0 92.2  PLT 148* 129* 163  --  153 142*     Basic Metabolic Panel: Recent Labs  Lab 04/19/22 0558 04/19/22 0816 04/19/22 1346 04/19/22 1554 04/19/22 2104 04/20/22 0345 04/20/22 0751 04/20/22 1657 04/21/22 0503  NA 139   < > 143   < > 142 139  143 140 140 138  K 4.2   < > 4.3   < > 4.2 4.2  4.3 4.3 3.6 3.7  CL 105   < > 109  --  112* 109  --  107 106  CO2 22  --   --   --  21* 19*  --  22 23  GLUCOSE 94   < > 155*  --  142* 175*  --  144* 122*  BUN 19   < > 16  --  15 15  --  15 18  CREATININE 1.20   < > 0.60*  --  0.90 1.08  --  1.14 0.98  CALCIUM 8.8*  --   --   --  7.8* 7.7*  --  8.1* 8.4*  MG  --   --   --   --  2.2 2.1  --  2.0  --    < > = values in this interval not displayed.    GFR: Estimated Creatinine Clearance: 65.9 mL/min (by C-G formula based on SCr of 0.98 mg/dL). Recent Labs  Lab 04/19/22 2104 04/20/22 0345 04/20/22 1657 04/21/22 0503  WBC 7.0 10.5 12.9* 12.4*     ABG    Component Value Date/Time   PHART 7.339 (L) 04/20/2022 0751   PCO2ART 30.0 (L) 04/20/2022 0751   PO2ART 153 (H) 04/20/2022 0751   HCO3 16.1 (L) 04/20/2022 0751   TCO2 17 (L) 04/20/2022 0751   ACIDBASEDEF 9.0 (H) 04/20/2022 0751   O2SAT 57.8 04/21/2022 0503     Critical care time: n/a    Julian Hy, DO 04/22/22 9:43 AM [facility]  For contact information, see Amion. If no response to pager, please call PCCM consult pager. After hours, 7PM- 7AM,  please call Elink.

## 2022-04-22 NOTE — Progress Notes (Signed)
TCTS DAILY ICU PROGRESS NOTE                   [address]            [place] [address]          [phone]   3 Days Post-Op Procedure(s) (LRB): CORONARY ARTERY BYPASS GRAFTING (CABG) X3 USING LEFT INTERNAL MAMMARY ARTERY (LIMA) AND ENDOSCOPICALLY HARVESTED RIGHT GREATER SAPHENOUS VEIN AND CORONARY ENDARTERECTOMY (N/A) TRANSESOPHAGEAL ECHOCARDIOGRAM (N/A) THYMECTOMY (N/A) CLIPPING OF ATRIAL APPENDAGE USING ATRICURE 40MM ATRICLIP  Total Length of Stay:  LOS: 7 days   Subjective:  Awake with appropriate mental status. No new concerns. Out of bed.  Foley catheter removed at 11pm, has not voided since but is not uncomfortable. Passing gas.  Objective: Vital signs in last 24 hours: Temp:  [98.1 F (36.7 C)-99.5 F (37.5 C)] 98.6 F (37 C) (02/29 0336) Pulse Rate:  [67-115] 110 (02/29 0700) Cardiac Rhythm: Normal sinus rhythm;Bundle branch block (02/29 0000) Resp:  [11-28] 27 (02/29 0700) BP: (102-152)/(62-101) 152/62 (02/29 0700) SpO2:  [90 %-100 %] 94 % (02/29 0700) Weight:  [85.9 kg] 85.9 kg (02/29 0500)  Filed Weights   04/20/22 0500 04/21/22 0615 04/22/22 0500  Weight: 78.9 kg 85.6 kg 85.9 kg    Weight change: 0.311 kg   Hemodynamic parameters for last 24 hours: PAP: (31)/(25-28) 31/27 CVP:  [7 mmHg-14 mmHg] 14 mmHg  Intake/Output from previous day: 02/28 0701 - 02/29 0700 In: 1325.2 [P.O.:620; I.V.:449.4; IV Piggyback:255.7] Out: 1189 [id number]; Drains:47]  Intake/Output this shift: No intake/output data recorded.  Current Meds: Scheduled Meds:  acetaminophen  1,000 mg Oral Q6H   Or   acetaminophen (TYLENOL) oral liquid 160 mg/5 mL  1,000 mg Per Tube Q6H   aspirin EC  81 mg Oral Daily   bisacodyl  10 mg Oral Daily   Or   bisacodyl  10 mg Rectal Daily   Chlorhexidine Gluconate Cloth  6 each Topical Daily   clopidogrel  75 mg Oral Daily   docusate sodium  200 mg Oral Daily   enoxaparin (LOVENOX) injection  40 mg Subcutaneous QHS    furosemide  20 mg Intravenous BID   insulin aspart  0-24 Units Subcutaneous Q4H   metoprolol succinate  12.5 mg Oral Daily   mouth rinse  15 mL Mouth Rinse TID PC & HS   pantoprazole  40 mg Oral Daily   predniSONE  10 mg Oral QAC breakfast   sodium chloride flush  3 mL Intravenous Q12H   Continuous Infusions:  sodium chloride Stopped (04/19/22 2140)   sodium chloride     sodium chloride Stopped (04/21/22 0928)   famotidine (PEPCID) IV Stopped (04/21/22 2327)   lactated ringers Stopped (04/20/22 2004)   lactated ringers 20 mL/hr at 04/22/22 0700   PRN Meds:.sodium chloride, midazolam, morphine injection, ondansetron (ZOFRAN) IV, mouth rinse, oxyCODONE, sodium chloride flush, traMADol  General appearance: alert, cooperative, and no distress Neurologic: no focal deficits Heart: RRR,  Lungs: breath sounds clear anterior. CXR stable post removeal of the chest tubes. Stable sats on 2Lnc,  normal WOB. Abdomen: firm, not tender Extremities: well perfused Wound: the sternotomy incision is covered with a dry Aquacel dressing  Lab Results: CBC: Recent Labs    04/20/22 1657 04/21/22 0503  WBC 12.9* 12.4*  HGB 8.7* 7.9*  HCT 26.6* 24.9*  PLT 153 142*    BMET:  Recent Labs    04/20/22 1657 04/21/22 0503  NA 140 138  K 3.6 3.7  CL 107 106  CO2 22 23  GLUCOSE 144* 122*  BUN 15 18  CREATININE 1.14 0.98  CALCIUM 8.1* 8.4*     CMET: Lab Results  Component Value Date   WBC 12.4 (H) 04/21/2022   HGB 7.9 (L) 04/21/2022   HCT 24.9 (L) 04/21/2022   PLT 142 (L) 04/21/2022   GLUCOSE 122 (H) 04/21/2022   CHOL 216 (H) 11/10/2021   TRIG 202 (H) 11/10/2021   HDL 58 11/10/2021   LDLCALC 123 (H) 11/10/2021   ALT 14 04/18/2022   AST 27 04/18/2022   NA 138 04/21/2022   K 3.7 04/21/2022   CL 106 04/21/2022   CREATININE 0.98 04/21/2022   BUN 18 04/21/2022   CO2 23 04/21/2022   TSH 2.32 11/25/2021   INR 1.4 (H) 04/19/2022   HGBA1C 5.5 04/18/2022      PT/INR:  Recent  Labs    04/19/22 1555  LABPROT 16.8*  INR 1.4*    Radiology: No results found.   Assessment/Plan: S/P Procedure(s) (LRB): CORONARY ARTERY BYPASS GRAFTING (CABG) X3 USING LEFT INTERNAL MAMMARY ARTERY (LIMA) AND ENDOSCOPICALLY HARVESTED RIGHT GREATER SAPHENOUS VEIN AND CORONARY ENDARTERECTOMY (N/A) TRANSESOPHAGEAL ECHOCARDIOGRAM (N/A) THYMECTOMY (N/A) CLIPPING OF ATRIAL APPENDAGE USING ATRICURE 40MM ATRICLIP  -POD 3 CABG x 3 with coronary endarterectomy to the OM for MVCAD presenting with unstable angina and normal biventricular function. Stable BP and cardiac rhythm.  On ASA, avoiding statin and BB due to h/o MG . ( He was on Toprol Xl 25 and pitavastatin prior to surgery.) On Plavix for coronary endarterectomy.  Pleural and mediastinal chest tubes are out, d/c the JP today.   -Myasthenia Gravis- s/p thymectomy at time of CABG. Back on his daily prednisone '10mg'$  daily as prior to admission. Breathing comfortably since extubation with reasonable resp mechanics. Will need to avoid Mg++ post op due to myasthenia gravis and other medications as listed in the neuro consult 2/23.   -Expected acute blood loss anemia - transfused 1 unit on PPOD-1 for Hct 21, Hct stable. Monitor  -RENAL- normal function at baseline. Has not voided since Foley catheter removal, bladder scan is not showing significant retained volume so far. Continue Lasix. Monitoring.   -ENDO-no h/o DM, mild hyperglycemia likely related to the steroids. Continue to monitor, SSI.   -GI- Tolerating PO's, abd firm,active bowel sounds, Feels like he needs to move his bowels this morning. Diet as tolerated  -DVT PPX- on enoxaparin. Advance activity.    Antony Odea, PA-C 04/22/2022 7:43 AM

## 2022-04-22 NOTE — Progress Notes (Signed)
Pt achieved NIF of -38 and a VC of 1.52L with good pt effort on all attempts. Pt tolerated well. MD notified. RT will monitor.

## 2022-04-22 NOTE — Telephone Encounter (Signed)
Wife is calling in because patient wanted to speak with the nurse. Please advsie

## 2022-04-22 NOTE — Progress Notes (Signed)
TCTS Progress Note: 3 Days Post-Op Procedure(s) (LRB): CORONARY ARTERY BYPASS GRAFTING (CABG) X3 USING LEFT INTERNAL MAMMARY ARTERY (LIMA) AND ENDOSCOPICALLY HARVESTED RIGHT GREATER SAPHENOUS VEIN AND CORONARY ENDARTERECTOMY (N/A) TRANSESOPHAGEAL ECHOCARDIOGRAM (N/A) THYMECTOMY (N/A) CLIPPING OF ATRIAL APPENDAGE USING ATRICURE 40MM ATRICLIP  LOS: 7 days   Doing well s/p CAB   Plan today dc central line, dc drain MD will cut wires later before DC Floor this afternoon vs tomorrow AM  Meds:  ASA: yes in addition to plavix (endarterectomy - long) DVT ppx: yes BB: yes -adding back in today Lasix - yes 20BID Statin - issues with myasthenia and statins. Plan is for him to restart his home statin when he goes home, this med is not available at [facility].        Latest Ref Rng & Units 04/22/2022    7:47 AM 04/21/2022    5:03 AM 04/20/2022    4:57 PM  CBC  WBC 4.0 - 10.5 K/uL 15.4  12.4  12.9   Hemoglobin 13.0 - 17.0 g/dL 8.4  7.9  8.7   Hematocrit 39.0 - 52.0 % 26.2  24.9  26.6   Platelets 150 - 400 K/uL 176  142  153        Latest Ref Rng & Units 04/22/2022    7:47 AM 04/21/2022    5:03 AM 04/20/2022    4:57 PM  CMP  Glucose 70 - 99 mg/dL 178  122  144   BUN 8 - 23 mg/dL '27  18  15   '$ Creatinine 0.61 - 1.24 mg/dL 1.21  0.98  1.14   Sodium 135 - 145 mmol/L 139  138  140   Potassium 3.5 - 5.1 mmol/L 4.0  3.7  3.6   Chloride 98 - 111 mmol/L 104  106  107   CO2 22 - 32 mmol/L '24  23  22   '$ Calcium 8.9 - 10.3 mg/dL 8.5  8.4  8.1     ABG    Component Value Date/Time   PHART 7.339 (L) 04/20/2022 0751   PCO2ART 30.0 (L) 04/20/2022 0751   PO2ART 153 (H) 04/20/2022 0751   HCO3 16.1 (L) 04/20/2022 0751   TCO2 17 (L) 04/20/2022 0751   ACIDBASEDEF 9.0 (H) 04/20/2022 0751   O2SAT 57.8 04/21/2022 0503

## 2022-04-22 NOTE — Telephone Encounter (Signed)
Wife called to report that pt is currently in [place]. He had CABG x3. She just wanted Dr. Bettina Gavia to know.

## 2022-04-23 ENCOUNTER — Inpatient Hospital Stay (HOSPITAL_COMMUNITY): Payer: Non-veteran care

## 2022-04-23 DIAGNOSIS — I214 Non-ST elevation (NSTEMI) myocardial infarction: Secondary | ICD-10-CM | POA: Diagnosis not present

## 2022-04-23 LAB — CBC
HCT: 27.9 % — ABNORMAL LOW (ref 39.0–52.0)
Hemoglobin: 8.9 g/dL — ABNORMAL LOW (ref 13.0–17.0)
MCH: 29.3 pg (ref 26.0–34.0)
MCHC: 31.9 g/dL (ref 30.0–36.0)
MCV: 91.8 fL (ref 80.0–100.0)
Platelets: 228 10*3/uL (ref 150–400)
RBC: 3.04 MIL/uL — ABNORMAL LOW (ref 4.22–5.81)
RDW: 14.7 % (ref 11.5–15.5)
WBC: 11.5 10*3/uL — ABNORMAL HIGH (ref 4.0–10.5)
nRBC: 1 % — ABNORMAL HIGH (ref 0.0–0.2)

## 2022-04-23 LAB — GLUCOSE, CAPILLARY
Glucose-Capillary: 120 mg/dL — ABNORMAL HIGH (ref 70–99)
Glucose-Capillary: 149 mg/dL — ABNORMAL HIGH (ref 70–99)
Glucose-Capillary: 152 mg/dL — ABNORMAL HIGH (ref 70–99)
Glucose-Capillary: 154 mg/dL — ABNORMAL HIGH (ref 70–99)

## 2022-04-23 LAB — BASIC METABOLIC PANEL
Anion gap: 12 (ref 5–15)
BUN: 21 mg/dL (ref 8–23)
CO2: 27 mmol/L (ref 22–32)
Calcium: 8.5 mg/dL — ABNORMAL LOW (ref 8.9–10.3)
Chloride: 99 mmol/L (ref 98–111)
Creatinine, Ser: 1.05 mg/dL (ref 0.61–1.24)
GFR, Estimated: >60 mL/min (ref >60)
Glucose, Bld: 150 mg/dL — ABNORMAL HIGH (ref 70–99)
Potassium: 3.7 mmol/L (ref 3.5–5.1)
Sodium: 138 mmol/L (ref 135–145)

## 2022-04-23 MED ORDER — POLYETHYLENE GLYCOL 3350 17 G PO PACK: 17.0000 g | PACK | Freq: Every day | ORAL | Status: DC | PRN

## 2022-04-23 MED ORDER — INSULIN ASPART 100 UNIT/ML IJ SOLN
0.0000 [IU] | Freq: Three times a day (TID) | INTRAMUSCULAR | Status: DC
  Administered 2022-04-23 (×2): 2 [IU] via SUBCUTANEOUS
  Administered 2022-04-24: 4 [IU] via SUBCUTANEOUS
  Administered 2022-04-25: 2 [IU] via SUBCUTANEOUS

## 2022-04-23 NOTE — Progress Notes (Signed)
TCTS DAILY ICU PROGRESS NOTE                   [address]            [place] [address]          [phone]   4 Days Post-Op Procedure(s) (LRB): CORONARY ARTERY BYPASS GRAFTING (CABG) X3 USING LEFT INTERNAL MAMMARY ARTERY (LIMA) AND ENDOSCOPICALLY HARVESTED RIGHT GREATER SAPHENOUS VEIN AND CORONARY ENDARTERECTOMY (N/A) TRANSESOPHAGEAL ECHOCARDIOGRAM (N/A) THYMECTOMY (N/A) CLIPPING OF ATRIAL APPENDAGE USING ATRICURE 40MM ATRICLIP  Total Length of Stay:  LOS: 8 days   Subjective:  Up in the bedside chair, walked in the hall earlier this morning.  Having some nausea without abd pain after starting to eat breakfast.   BM yesterday.   Currently on RA  Objective: Vital signs in last 24 hours: Temp:  [98.1 F (36.7 C)-99.4 F (37.4 C)] 99.4 F (37.4 C) (03/01 0300) Pulse Rate:  [81-116] 90 (03/01 0600) Cardiac Rhythm: Normal sinus rhythm (03/01 0400) Resp:  [12-27] 21 (03/01 0600) BP: (115-155)/(57-113) 138/88 (03/01 0600) SpO2:  [91 %-98 %] 97 % (03/01 0600) Weight:  [84 kg] 84 kg (03/01 0600)  Filed Weights   04/21/22 0615 04/22/22 0500 04/23/22 0600  Weight: 85.6 kg 85.9 kg 84 kg    Weight change: -1.911 kg     Intake/Output from previous day: 02/29 0701 - 03/01 0700 In: 253.1 [P.O.:240; I.V.:13.1] Out: 1475 [Urine:1475]  Intake/Output this shift: No intake/output data recorded.  Current Meds: Scheduled Meds:  acetaminophen  1,000 mg Oral Q6H   aspirin EC  81 mg Oral Daily   bisacodyl  10 mg Oral Daily   Chlorhexidine Gluconate Cloth  6 each Topical Daily   clopidogrel  75 mg Oral Daily   docusate sodium  200 mg Oral Daily   enoxaparin (LOVENOX) injection  40 mg Subcutaneous QHS   famotidine  20 mg Oral BID   furosemide  20 mg Intravenous BID   insulin aspart  0-24 Units Subcutaneous Q4H   metoprolol succinate  12.5 mg Oral Daily   pantoprazole  40 mg Oral Daily   predniSONE  10 mg Oral QAC breakfast   sodium chloride flush  3 mL  Intravenous Q12H   Continuous Infusions:   PRN Meds:.midazolam, morphine injection, ondansetron (ZOFRAN) IV, mouth rinse, oxyCODONE, sodium chloride flush, traMADol  General appearance: alert, cooperative, and mild distress related to nausea Neurologic: no focal deficits Heart: RRR,  Lungs: breath sounds clear, normal WOB, stable O2 sat Abdomen: firm, not tender Extremities: well perfused Wound: the sternotomy incision is well approximated and dry  Lab Results: CBC: Recent Labs    04/21/22 0503 04/22/22 0747  WBC 12.4* 15.4*  HGB 7.9* 8.4*  HCT 24.9* 26.2*  PLT 142* 176    BMET:  Recent Labs    04/21/22 0503 04/22/22 0747  NA 138 139  K 3.7 4.0  CL 106 104  CO2 23 24  GLUCOSE 122* 178*  BUN 18 27*  CREATININE 0.98 1.21  CALCIUM 8.4* 8.5*     CMET: Lab Results  Component Value Date   WBC 15.4 (H) 04/22/2022   HGB 8.4 (L) 04/22/2022   HCT 26.2 (L) 04/22/2022   PLT 176 04/22/2022   GLUCOSE 178 (H) 04/22/2022   CHOL 216 (H) 11/10/2021   TRIG 202 (H) 11/10/2021   HDL 58 11/10/2021   LDLCALC 123 (H) 11/10/2021   ALT 14 04/18/2022   AST 27 04/18/2022  NA 139 04/22/2022   K 4.0 04/22/2022   CL 104 04/22/2022   CREATININE 1.21 04/22/2022   BUN 27 (H) 04/22/2022   CO2 24 04/22/2022   TSH 2.32 11/25/2021   INR 1.4 (H) 04/19/2022   HGBA1C 5.5 04/18/2022      PT/INR:  No results for input(s): "LABPROT", "INR" in the last 72 hours.  Radiology: No results found.   Assessment/Plan: S/P Procedure(s) (LRB): CORONARY ARTERY BYPASS GRAFTING (CABG) X3 USING LEFT INTERNAL MAMMARY ARTERY (LIMA) AND ENDOSCOPICALLY HARVESTED RIGHT GREATER SAPHENOUS VEIN AND CORONARY ENDARTERECTOMY (N/A) TRANSESOPHAGEAL ECHOCARDIOGRAM (N/A) THYMECTOMY (N/A) CLIPPING OF ATRIAL APPENDAGE USING ATRICURE 40MM ATRICLIP  -POD 4 CABG x 3 with coronary endarterectomy to the OM for MVCAD presenting with unstable angina and normal biventricular function. BP trending up with SBP in  140-150's and he is tachycardic.  On ASA, low-dose metoprolol.  Will consider increasing the metoprolol. On Plavix for coronary endarterectomy.   -Myasthenia Gravis- s/p thymectomy at time of CABG. Back on his daily prednisone '10mg'$  daily as prior to admission. Breathing comfortably and swallowing without difficulty.   -Expected acute blood loss anemia - transfused 1 unit on POD-1 for Hct 21, Hct stable. Monitor  -RENAL- normal function at baseline. Voiding without difficulty. Continue Lasix. Monitoring.   -ENDO-no h/o DM, mild hyperglycemia likely related to the steroids. Continue to monitor, SSI.   -GI- Having some nausea,  abd is benign. BM yesterday. Zofran, and will add Reglan if not effective.   -DVT PPX- on enoxaparin. Advance activity.      Antony Odea, PA-C 04/23/2022 7:24 AM

## 2022-04-23 NOTE — Progress Notes (Signed)
Pt's NIF and VC as follows: NIF -40 cmH2O VC 1.36L With good effort.

## 2022-04-23 NOTE — Progress Notes (Signed)
NAME:  Adrian Bell, MRN:  [id number], DOB:  [birth year], LOS: 8 ADMISSION DATE:  04/15/2022, CONSULTATION DATE:  2/26 REFERRING MD:  Tenny Craw, CHIEF COMPLAINT:  post-CABG   History of Present Illness:  Mr. Adrian Bell is a 77 y/o gentleman with a history of CAD s/p previous PCI who presented to [facility] from [place] with an NSTEMI. He presented with left-sided chest pain that was worse than his typical intermittent left chest pain, requiring 3 SL NTG to relieve his pain. He recently has endorsed exertional dyspnea as well. He underwent LHC upon transfer to [facility] and was found to have severe 3-vessel disease. He was evaluated by Cardiothoracic surgery for consideration of bypass surgery. At baseline he is active and independent. He has been managed with his baseline cardiac medications and heparin until today. EF is preserved. Cardiac risk factors include previous use of smokeless tobacco, HTN, HLD, age, gender. He underwent CABG x 3, LCX end-arterectomy, LAA clipping, and empiric thymectomy given history of MG.  Post-operatively he is on epinephrine. Last dose of paralytics was noon today; before leaving the OR he had only 1/4 twitches. Total rocuronium dose today '130mg'$ .   He has a history of MG which was diagnosed in 2018. His symptoms include diplopia, dysarthria, dysphagia. His maintenance regimen for MG is porednisone '10mg'$  daily with 1 previous flare since diagnosis. He has had an adverse reaction to IVIG in the past and was discontinued from this maintenance regimen in 12/2021.   Pertinent  Medical History  CAD HTN HLD MG on prednisone '10mg'$  daily nephrolithiasis  Significant Hospital Events: Including procedures, antibiotic start and stop dates in addition to other pertinent events   2/23 admission, LHC 2/26 CABG x 3, Lcx end-arterectomy, LAA clip, thymectomy  Interim History / Subjective:  C/o of nausea and some abd distention and mild tenderness Last BM yesterday Ambulated around unit this am NIF -40 and  VC -1.36L  Objective   Blood pressure (!) 142/90, pulse (!) 104, temperature 98 F (36.7 C), temperature source Oral, resp. rate 20, height '5\' 6"'$  (1.676 m), weight 84 kg, SpO2 97 %.        Intake/Output Summary (Last 24 hours) at 04/23/2022 1030 Last data filed at 04/23/2022 1000 Gross per 24 hour  Intake 240 ml  Output 1575 ml  Net -1335 ml   Filed Weights   04/21/22 0615 04/22/22 0500 04/23/22 0600  Weight: 85.6 kg 85.9 kg 84 kg    Examination: General:  Pleasant older male sitting in bed in NAD HEENT: MM pink/slightly moist Neuro: Awake, appropriate, MAE CV: rr, ST, no murmur, clean midline sternal incision PULM:  non labored, on RA, clear, diminished in left base GI: distended, mild diffuse tenderness, no guarding, +bs, voids Extremities: warm/dry, trace LE edema, RLE incisions cdi/ approximated  Skin: no rashes   Tmax 99.4 UOP 1.4L plus two unmeasured  Labs > pending CBG trend stable  CXR > slightly improved LLL atelectasis +/- small effusion  Resolved Hospital Problem list     Assessment & Plan:  3-vessel CAD with NSTEMI, s/p CABG x 3, end-arterectomy of LCX, LAA clipping, thymectomy - multimodal post-op pain control - cont metoprolol. Continue home dose, caution with increasing given hx MG - statin at discharge - DAPT - diureses per TCTS  - avoid IV Mg+ due to MG  At risk for adrenal insufficiency due to chronic steroid use -con't PTA prednisone '10mg'$  daily.    MG; not acutely exacerbated - NIF and VC daily, remain reassuring  -  monitor for dysphagia and dysarthria - Will need guidance from Neurology outpt on prednisone s/p thymectomy (per neurology notes, consider taper over the summer)   Post-op vent management - cont aggressive pulmonary hygiene  IS, mobilizing  - NIF & VC daily  Hyperglycemia - SSI PRN> change to AC/ AH  Post-op ABLA anemia- expected, stable Consumptive thrombocytopenia- resolved - transfuse for Hb< 7 or hemodynamically  significant bleeding  GERD - PPI, H2 blocker  Nausea, abd distention - last BM yest - nausea resolved currently, zofran prn - KUB pending  - diet scaled back to clears for now  PCCM will follow while in ICU.   Best Practice (right click and "Reselect all SmartList Selections" daily)   Diet/type: clear liquids DVT prophylaxis: LMWH GI prophylaxis: H2B and PPI Lines: N/A Foley:  N/A Code Status:  full code Last date of multidisciplinary goals of care discussion [ per primary ]  Labs   CBC: Recent Labs  Lab 04/19/22 2104 04/20/22 0345 04/20/22 0751 04/20/22 1657 04/21/22 0503 04/22/22 0747  WBC 7.0 10.5  --  12.9* 12.4* 15.4*  HGB 7.7* 7.3*  7.1* 7.1* 8.7* 7.9* 8.4*  HCT 22.8* 22.0*  21.0* 21.0* 26.6* 24.9* 26.2*  MCV 92.3 92.4  --  92.0 92.2 93.2  PLT 129* 163  --  153 142* 0000000    Basic Metabolic Panel: Recent Labs  Lab 04/19/22 2104 04/20/22 0345 04/20/22 0751 04/20/22 1657 04/21/22 0503 04/22/22 0747  NA 142 139  143 140 140 138 139  K 4.2 4.2  4.3 4.3 3.6 3.7 4.0  CL 112* 109  --  107 106 104  CO2 21* 19*  --  '22 23 24  '$ GLUCOSE 142* 175*  --  144* 122* 178*  BUN 15 15  --  15 18 27*  CREATININE 0.90 1.08  --  1.14 0.98 1.21  CALCIUM 7.8* 7.7*  --  8.1* 8.4* 8.5*  MG 2.2 2.1  --  2.0  --   --    GFR: Estimated Creatinine Clearance: 52.8 mL/min (by C-G formula based on SCr of 1.21 mg/dL). Recent Labs  Lab 04/20/22 0345 04/20/22 1657 04/21/22 0503 04/22/22 0747  WBC 10.5 12.9* 12.4* 15.4*    ABG    Component Value Date/Time   PHART 7.339 (L) 04/20/2022 0751   PCO2ART 30.0 (L) 04/20/2022 0751   PO2ART 153 (H) 04/20/2022 0751   HCO3 16.1 (L) 04/20/2022 0751   TCO2 17 (L) 04/20/2022 0751   ACIDBASEDEF 9.0 (H) 04/20/2022 0751   O2SAT 57.8 04/21/2022 0503     Critical care time: n/a       Kennieth Rad, MSN, AG-ACNP-BC [facility] 04/23/2022, 10:30 AM  See Amion for pager If no response to pager, please  call PCCM consult pager After 7:00 pm call Elink

## 2022-04-23 NOTE — Progress Notes (Signed)
      [address]       [place] [address]             [phone]      4 Days Post-Op  Procedure(s) (LRB): CORONARY ARTERY BYPASS GRAFTING (CABG) X3 USING LEFT INTERNAL MAMMARY ARTERY (LIMA) AND ENDOSCOPICALLY HARVESTED RIGHT GREATER SAPHENOUS VEIN AND CORONARY ENDARTERECTOMY (N/A) TRANSESOPHAGEAL ECHOCARDIOGRAM (N/A) THYMECTOMY (N/A) CLIPPING OF ATRIAL APPENDAGE USING ATRICURE 40MM ATRICLIP   Total Length of Stay:  LOS: 8 days    SUBJECTIVE: Resting om bed but is awake. Says nausea has resolved and he is overall comfortable. He has been on clear liquids today.    Vitals:   04/23/22 1900 04/23/22 2000  BP:  117/77  Pulse:  90  Resp:  15  Temp: 98.3 F (36.8 C)   SpO2:  95%    Intake/Output      03/01 0701 03/02 0700   P.O.    I.V. (mL/kg)    Total Intake(mL/kg)    Urine (mL/kg/hr) 900 (0.8)   Stool    Total Output 900   Net -900            CBC    Component Value Date/Time   WBC 11.5 (H) 04/23/2022 1105   RBC 3.04 (L) 04/23/2022 1105   HGB 8.9 (L) 04/23/2022 1105   HGB 12.8 (L) 12/04/2018 1513   HCT 27.9 (L) 04/23/2022 1105   HCT 37.8 12/04/2018 1513   PLT 228 04/23/2022 1105   PLT 230 12/04/2018 1513   MCV 91.8 04/23/2022 1105   MCV 93 12/04/2018 1513   MCH 29.3 04/23/2022 1105   MCHC 31.9 04/23/2022 1105   RDW 14.7 04/23/2022 1105   RDW 12.9 12/04/2018 1513   CMP     Component Value Date/Time   NA 138 04/23/2022 1105   NA 140 10/16/2019 1142   K 3.7 04/23/2022 1105   CL 99 04/23/2022 1105   CO2 27 04/23/2022 1105   GLUCOSE 150 (H) 04/23/2022 1105   BUN 21 04/23/2022 1105   BUN 13 10/16/2019 1142   CREATININE 1.05 04/23/2022 1105   CALCIUM 8.5 (L) 04/23/2022 1105   PROT 6.7 04/18/2022 1141   PROT 6.8 10/16/2019 1142   ALBUMIN 3.9 04/18/2022 1141   ALBUMIN 4.5 10/16/2019 1142   AST 27 04/18/2022 1141   ALT 14 04/18/2022 1141   ALKPHOS 54 04/18/2022 1141   BILITOT 0.7 04/18/2022 1141   BILITOT 0.4 10/16/2019  1142   GFRNONAA >60 04/23/2022 1105   GFRAA 76 10/16/2019 1142   ABG    Component Value Date/Time   PHART 7.339 (L) 04/20/2022 0751   PCO2ART 30.0 (L) 04/20/2022 0751   PO2ART 153 (H) 04/20/2022 0751   HCO3 16.1 (L) 04/20/2022 0751   TCO2 17 (L) 04/20/2022 0751   ACIDBASEDEF 9.0 (H) 04/20/2022 0751   O2SAT 57.8 04/21/2022 0503   CBG (last 3)  Recent Labs    04/23/22 0804 04/23/22 1111 04/23/22 1537  GLUCAP 149* 152* 154*     ASSESSMENT: Stable day.  BP control improved.  Nausea resolved and abd remains benign.  Expect we can advance diet tomorrow and transfer to floor.    Antony Odea, PA-C 04/23/2022

## 2022-04-23 NOTE — Progress Notes (Signed)
TCTS Progress Note: 4 Days Post-Op Procedure(s) (LRB): CORONARY ARTERY BYPASS GRAFTING (CABG) X3 USING LEFT INTERNAL MAMMARY ARTERY (LIMA) AND ENDOSCOPICALLY HARVESTED RIGHT GREATER SAPHENOUS VEIN AND CORONARY ENDARTERECTOMY (N/A) TRANSESOPHAGEAL ECHOCARDIOGRAM (N/A) THYMECTOMY (N/A) CLIPPING OF ATRIAL APPENDAGE USING ATRICURE 40MM ATRICLIP  LOS: 8 days    Seems less comfortable this AM  Indicates his abdomen  Abd disteneded, nttp, in particular LLQ full   Plan: Obtain KUB, CXR, cbc and chem  Want daily cbc, chem for 3 days  Move to clears for po  Keep in icu at least this AM.       Latest Ref Rng & Units 04/22/2022    7:47 AM 04/21/2022    5:03 AM 04/20/2022    4:57 PM  CBC  WBC 4.0 - 10.5 K/uL 15.4  12.4  12.9   Hemoglobin 13.0 - 17.0 g/dL 8.4  7.9  8.7   Hematocrit 39.0 - 52.0 % 26.2  24.9  26.6   Platelets 150 - 400 K/uL 176  142  153        Latest Ref Rng & Units 04/22/2022    7:47 AM 04/21/2022    5:03 AM 04/20/2022    4:57 PM  CMP  Glucose 70 - 99 mg/dL 178  122  144   BUN 8 - 23 mg/dL '27  18  15   '$ Creatinine 0.61 - 1.24 mg/dL 1.21  0.98  1.14   Sodium 135 - 145 mmol/L 139  138  140   Potassium 3.5 - 5.1 mmol/L 4.0  3.7  3.6   Chloride 98 - 111 mmol/L 104  106  107   CO2 22 - 32 mmol/L '24  23  22   '$ Calcium 8.9 - 10.3 mg/dL 8.5  8.4  8.1     ABG    Component Value Date/Time   PHART 7.339 (L) 04/20/2022 0751   PCO2ART 30.0 (L) 04/20/2022 0751   PO2ART 153 (H) 04/20/2022 0751   HCO3 16.1 (L) 04/20/2022 0751   TCO2 17 (L) 04/20/2022 0751   ACIDBASEDEF 9.0 (H) 04/20/2022 0751   O2SAT 57.8 04/21/2022 0503

## 2022-04-24 ENCOUNTER — Inpatient Hospital Stay (HOSPITAL_COMMUNITY): Payer: Non-veteran care

## 2022-04-24 DIAGNOSIS — I214 Non-ST elevation (NSTEMI) myocardial infarction: Secondary | ICD-10-CM | POA: Diagnosis not present

## 2022-04-24 LAB — CBC
HCT: 25.5 % — ABNORMAL LOW (ref 39.0–52.0)
Hemoglobin: 8.5 g/dL — ABNORMAL LOW (ref 13.0–17.0)
MCH: 30.5 pg (ref 26.0–34.0)
MCHC: 33.3 g/dL (ref 30.0–36.0)
MCV: 91.4 fL (ref 80.0–100.0)
Platelets: 210 10*3/uL (ref 150–400)
RBC: 2.79 MIL/uL — ABNORMAL LOW (ref 4.22–5.81)
RDW: 14.5 % (ref 11.5–15.5)
WBC: 11.2 10*3/uL — ABNORMAL HIGH (ref 4.0–10.5)
nRBC: 1.3 % — ABNORMAL HIGH (ref 0.0–0.2)

## 2022-04-24 LAB — GLUCOSE, CAPILLARY
Glucose-Capillary: 182 mg/dL — ABNORMAL HIGH (ref 70–99)
Glucose-Capillary: 117 mg/dL — ABNORMAL HIGH (ref 70–99)
Glucose-Capillary: 138 mg/dL — ABNORMAL HIGH (ref 70–99)

## 2022-04-24 LAB — BASIC METABOLIC PANEL
Anion gap: 18 — ABNORMAL HIGH (ref 5–15)
BUN: 21 mg/dL (ref 8–23)
CO2: 20 mmol/L — ABNORMAL LOW (ref 22–32)
Calcium: 8.5 mg/dL — ABNORMAL LOW (ref 8.9–10.3)
Chloride: 99 mmol/L (ref 98–111)
Creatinine, Ser: 0.95 mg/dL (ref 0.61–1.24)
GFR, Estimated: >60 mL/min (ref >60)
Glucose, Bld: 99 mg/dL (ref 70–99)
Potassium: 3.8 mmol/L (ref 3.5–5.1)
Sodium: 137 mmol/L (ref 135–145)

## 2022-04-24 MED ORDER — POTASSIUM CHLORIDE CRYS ER 20 MEQ PO TBCR
20.0000 meq | EXTENDED_RELEASE_TABLET | Freq: Two times a day (BID) | ORAL | Status: AC
  Administered 2022-04-24 – 2022-04-25 (×4): 20 meq via ORAL
  Filled 2022-04-24 (×4): qty 1

## 2022-04-24 MED ORDER — FUROSEMIDE 10 MG/ML IJ SOLN
40.0000 mg | Freq: Two times a day (BID) | INTRAMUSCULAR | Status: DC
  Administered 2022-04-24 – 2022-04-25 (×2): 40 mg via INTRAVENOUS
  Filled 2022-04-24 (×2): qty 4

## 2022-04-24 MED ORDER — METOCLOPRAMIDE HCL 5 MG/ML IJ SOLN
10.0000 mg | Freq: Four times a day (QID) | INTRAMUSCULAR | Status: AC
  Administered 2022-04-24 – 2022-04-25 (×4): 10 mg via INTRAVENOUS
  Filled 2022-04-24 (×4): qty 2

## 2022-04-24 NOTE — Progress Notes (Signed)
NAME:  Adrian Bell, MRN:  [id number], DOB:  December 19, 1945, LOS: 9 ADMISSION DATE:  04/15/2022, CONSULTATION DATE:  2/26 REFERRING MD:  Tenny Craw, CHIEF COMPLAINT:  post-CABG   History of Present Illness:  Adrian Bell is a 77 y/o gentleman with a history of CAD s/p previous PCI who presented to [facility] from [place] with an NSTEMI. He presented with left-sided chest pain that was worse than his typical intermittent left chest pain, requiring 3 SL NTG to relieve his pain. He recently has endorsed exertional dyspnea as well. He underwent LHC upon transfer to [facility] and was found to have severe 3-vessel disease. He was evaluated by Cardiothoracic surgery for consideration of bypass surgery. At baseline he is active and independent. He has been managed with his baseline cardiac medications and heparin until today. EF is preserved. Cardiac risk factors include previous use of smokeless tobacco, HTN, HLD, age, gender. He underwent CABG x 3, LCX end-arterectomy, LAA clipping, and empiric thymectomy given history of MG.  Post-operatively he is on epinephrine. Last dose of paralytics was noon today; before leaving the OR he had only 1/4 twitches. Total rocuronium dose today '130mg'$ .   He has a history of MG which was diagnosed in 2018. His symptoms include diplopia, dysarthria, dysphagia. His maintenance regimen for MG is porednisone '10mg'$  daily with 1 previous flare since diagnosis. He has had an adverse reaction to IVIG in the past and was discontinued from this maintenance regimen in 12/2021.   Pertinent  Medical History  CAD HTN HLD MG on prednisone '10mg'$  daily nephrolithiasis  Significant Hospital Events: Including procedures, antibiotic start and stop dates in addition to other pertinent events   2/23 admission, LHC 2/26 CABG x 3, Lcx end-arterectomy, LAA clip, thymectomy 3/2 possible transfer out of ICU   Interim History / Subjective:   NIF VC remain assuring   Objective   Blood pressure (!) 124/95, pulse 95,  temperature 98.2 F (36.8 C), temperature source Oral, resp. rate (!) 23, height '5\' 6"'$  (1.676 m), weight 83.1 kg, SpO2 95 %.        Intake/Output Summary (Last 24 hours) at 04/24/2022 1044 Last data filed at 04/24/2022 0900 Gross per 24 hour  Intake 240 ml  Output 1150 ml  Net -910 ml   Filed Weights   04/22/22 0500 04/23/22 0600 04/24/22 0500  Weight: 85.9 kg 84 kg 83.1 kg    Examination: General: wdwn older adult M NAD HEENT: NCAT pink mm  Neuro: AAOx4  CV: rr PULM:  even unlabored on RA  GI: ndnt  Extremities: no acute joint deformity  Skin: c/d/w   Resolved Hospital Problem list     Assessment & Plan:    3v CAD with NSTEMI s/p CABG x3 + end arterectomy Lcx, LAA clipping, thymectomy  P -Multimodal pain mgmnt -metop, planning statin at dc   -DAPT -avoid IV mag due to MG   MG, without acute exacerbation  Chronic steroid use  -cont NIF VC -oupt neuro f/u re steroids (now s/p thymectomy)   Hyperglycemia - SSI   Expected post op ABLA  -PRN CBC  GERD - PPI, H2 blocker   PCCM will follow while in ICU.   Best Practice (right click and "Reselect all SmartList Selections" daily)   Diet/type: Regular consistency (see orders) DVT prophylaxis: LMWH GI prophylaxis: H2B and PPI Lines: N/A Foley:  N/A Code Status:  full code Last date of multidisciplinary goals of care discussion [ per primary ]  Labs   CBC: Recent  Labs  Lab 04/20/22 1657 04/21/22 0503 04/22/22 0747 04/23/22 1105 04/24/22 0108  WBC 12.9* 12.4* 15.4* 11.5* 11.2*  HGB 8.7* 7.9* 8.4* 8.9* 8.5*  HCT 26.6* 24.9* 26.2* 27.9* 25.5*  MCV 92.0 92.2 93.2 91.8 91.4  PLT 153 142* 176 228 [id number]    Basic Metabolic Panel: Recent Labs  Lab 04/19/22 2104 04/20/22 0345 04/20/22 0751 04/20/22 1657 04/21/22 0503 04/22/22 0747 04/23/22 1105 04/24/22 0108  NA 142 139  143   < > 140 138 139 138 137  K 4.2 4.2  4.3   < > 3.6 3.7 4.0 3.7 3.8  CL 112* 109  --  107 106 104 99 99  CO2 21* 19*  --   '22 23 24 27 '$ 20*  GLUCOSE 142* 175*  --  144* 122* 178* 150* 99  BUN 15 15  --  15 18 27* 21 21  CREATININE 0.90 1.08  --  1.14 0.98 1.21 1.05 0.95  CALCIUM 7.8* 7.7*  --  8.1* 8.4* 8.5* 8.5* 8.5*  MG 2.2 2.1  --  2.0  --   --   --   --    < > = values in this interval not displayed.   GFR: Estimated Creatinine Clearance: 66.9 mL/min (by C-G formula based on SCr of 0.95 mg/dL). Recent Labs  Lab 04/21/22 0503 04/22/22 0747 04/23/22 1105 04/24/22 0108  WBC 12.4* 15.4* 11.5* 11.2*    ABG    Component Value Date/Time   PHART 7.339 (L) 04/20/2022 0751   PCO2ART 30.0 (L) 04/20/2022 0751   PO2ART 153 (H) 04/20/2022 0751   HCO3 16.1 (L) 04/20/2022 0751   TCO2 17 (L) 04/20/2022 0751   ACIDBASEDEF 9.0 (H) 04/20/2022 0751   O2SAT 57.8 04/21/2022 0503     Critical care time: n/a     Eliseo Gum MSN, AGACNP-BC [place] for pager  04/24/2022, 10:44 AM

## 2022-04-24 NOTE — Progress Notes (Signed)
5 Days Post-Op Procedure(s) (LRB): CORONARY ARTERY BYPASS GRAFTING (CABG) X3 USING LEFT INTERNAL MAMMARY ARTERY (LIMA) AND ENDOSCOPICALLY HARVESTED RIGHT GREATER SAPHENOUS VEIN AND CORONARY ENDARTERECTOMY (N/A) TRANSESOPHAGEAL ECHOCARDIOGRAM (N/A) THYMECTOMY (N/A) CLIPPING OF ATRIAL APPENDAGE USING ATRICURE 40MM ATRICLIP Subjective:    Objective: Vital signs in last 24 hours: Temp:  [98.2 F (36.8 C)-98.4 F (36.9 C)] 98.4 F (36.9 C) (03/02 1129) Pulse Rate:  [81-112] 92 (03/02 1100) Cardiac Rhythm: Sinus tachycardia;Normal sinus rhythm (03/02 0800) Resp:  [12-27] 18 (03/02 1100) BP: (113-151)/(65-95) 124/71 (03/02 1100) SpO2:  [90 %-99 %] 99 % (03/02 1100) Weight:  [83.1 kg] 83.1 kg (03/02 0500)  Hemodynamic parameters for last 24 hours:    Intake/Output from previous day: 03/01 0701 - 03/02 0700 In: -  Out: 1450 [Urine:1450] Intake/Output this shift: Total I/O In: 240 [P.O.:240] Out: 150 [Urine:150]  General appearance: alert and cooperative Neurologic: intact Heart: regular rate and rhythm, S1, S2 normal, no murmur Lungs: clear to auscultation bilaterally Abdomen: soft, distended, non-tender; bowel sounds hypoactive Extremities: edema mild Wound: incision ok  Lab Results: Recent Labs    04/23/22 1105 04/24/22 0108  WBC 11.5* 11.2*  HGB 8.9* 8.5*  HCT 27.9* 25.5*  PLT 228 210   BMET:  Recent Labs    04/23/22 1105 04/24/22 0108  NA 138 137  K 3.7 3.8  CL 99 99  CO2 27 20*  GLUCOSE 150* 99  BUN 21 21  CREATININE 1.05 0.95  CALCIUM 8.5* 8.5*    PT/INR: No results for input(s): "LABPROT", "INR" in the last 72 hours. ABG    Component Value Date/Time   PHART 7.339 (L) 04/20/2022 0751   HCO3 16.1 (L) 04/20/2022 0751   TCO2 17 (L) 04/20/2022 0751   ACIDBASEDEF 9.0 (H) 04/20/2022 0751   O2SAT 57.8 04/21/2022 0503   CBG (last 3)  Recent Labs    04/23/22 1537 04/24/22 0748 04/24/22 1123  GLUCAP 154* 182* 117*   CXR: mild  atelectasis  Assessment/Plan: S/P Procedure(s) (LRB): CORONARY ARTERY BYPASS GRAFTING (CABG) X3 USING LEFT INTERNAL MAMMARY ARTERY (LIMA) AND ENDOSCOPICALLY HARVESTED RIGHT GREATER SAPHENOUS VEIN AND CORONARY ENDARTERECTOMY (N/A) TRANSESOPHAGEAL ECHOCARDIOGRAM (N/A) THYMECTOMY (N/A) CLIPPING OF ATRIAL APPENDAGE USING ATRICURE 40MM ATRICLIP  POD 5 Hemodynamically stable in sinus rhythm. Continue Lopressor.  Volume excess: Wt is 10-11 lbs over preop. Increase lasix to 40 bid. Replace K+  May have some degree of ileus but passing flatus. Continuing clear liquids, ambulation, Miralax.  IS, OOB.   LOS: 9 days    Adrian Bell 04/24/2022

## 2022-04-24 NOTE — Progress Notes (Signed)
Patient ID: Adrian Bell, male   DOB: January 15, 1946, 77 y.o.   MRN: [id number]  TCTS Evening Rounds:  Hemodynamically stable in sinus rhythm.  Had two BM's today and feels much better. Nausea resolved.  Sitting up eating dinner.

## 2022-04-24 NOTE — Progress Notes (Signed)
NIF >-40  VC - 1.5L  Great pt effort.

## 2022-04-25 ENCOUNTER — Inpatient Hospital Stay (HOSPITAL_COMMUNITY): Payer: Non-veteran care

## 2022-04-25 DIAGNOSIS — I214 Non-ST elevation (NSTEMI) myocardial infarction: Secondary | ICD-10-CM | POA: Diagnosis not present

## 2022-04-25 LAB — CBC
HCT: 23.9 % — ABNORMAL LOW (ref 39.0–52.0)
HCT: 21.4 % — ABNORMAL LOW (ref 39.0–52.0)
Hemoglobin: 7.9 g/dL — ABNORMAL LOW (ref 13.0–17.0)
Hemoglobin: 7.3 g/dL — ABNORMAL LOW (ref 13.0–17.0)
MCH: 29.9 pg (ref 26.0–34.0)
MCH: 30.4 pg (ref 26.0–34.0)
MCHC: 33.1 g/dL (ref 30.0–36.0)
MCHC: 34.1 g/dL (ref 30.0–36.0)
MCV: 90.5 fL (ref 80.0–100.0)
MCV: 89.2 fL (ref 80.0–100.0)
Platelets: 248 10*3/uL (ref 150–400)
Platelets: 200 10*3/uL (ref 150–400)
RBC: 2.64 MIL/uL — ABNORMAL LOW (ref 4.22–5.81)
RBC: 2.4 MIL/uL — ABNORMAL LOW (ref 4.22–5.81)
RDW: 14.4 % (ref 11.5–15.5)
RDW: 14.5 % (ref 11.5–15.5)
WBC: 11.5 10*3/uL — ABNORMAL HIGH (ref 4.0–10.5)
WBC: 15.4 10*3/uL — ABNORMAL HIGH (ref 4.0–10.5)
nRBC: 0.3 % — ABNORMAL HIGH (ref 0.0–0.2)
nRBC: 0.2 % (ref 0.0–0.2)

## 2022-04-25 LAB — BASIC METABOLIC PANEL
Anion gap: 10 (ref 5–15)
BUN: 26 mg/dL — ABNORMAL HIGH (ref 8–23)
CO2: 27 mmol/L (ref 22–32)
Calcium: 8.1 mg/dL — ABNORMAL LOW (ref 8.9–10.3)
Chloride: 99 mmol/L (ref 98–111)
Creatinine, Ser: 1.08 mg/dL (ref 0.61–1.24)
GFR, Estimated: >60 mL/min (ref >60)
Glucose, Bld: 116 mg/dL — ABNORMAL HIGH (ref 70–99)
Potassium: 4.2 mmol/L (ref 3.5–5.1)
Sodium: 136 mmol/L (ref 135–145)

## 2022-04-25 LAB — URINALYSIS, ROUTINE W REFLEX MICROSCOPIC
Bacteria, UA: NONE SEEN
Bilirubin Urine: NEGATIVE
Glucose, UA: NEGATIVE mg/dL
Hgb urine dipstick: NEGATIVE
Ketones, ur: NEGATIVE mg/dL
Leukocytes,Ua: NEGATIVE
Nitrite: NEGATIVE
Protein, ur: 100 mg/dL — AB
Specific Gravity, Urine: 1.019 (ref 1.005–1.030)
pH: 8 (ref 5.0–8.0)

## 2022-04-25 LAB — PROCALCITONIN: Procalcitonin: 3.42 ng/mL

## 2022-04-25 LAB — GLUCOSE, CAPILLARY: Glucose-Capillary: 133 mg/dL — ABNORMAL HIGH (ref 70–99)

## 2022-04-25 MED ORDER — PIPERACILLIN-TAZOBACTAM 3.375 G IVPB: 3.3750 g | Freq: Three times a day (TID) | INTRAVENOUS | Status: DC

## 2022-04-25 MED ORDER — FE FUM-VIT C-VIT B12-FA 460-60-0.01-1 MG PO CAPS
1.0000 | ORAL_CAPSULE | Freq: Every day | ORAL | Status: DC
  Administered 2022-04-26 – 2022-04-30 (×5): 1 via ORAL
  Filled 2022-04-25 (×5): qty 1

## 2022-04-25 MED ORDER — METOPROLOL TARTRATE 25 MG PO TABS
25.0000 mg | ORAL_TABLET | Freq: Two times a day (BID) | ORAL | Status: DC
  Administered 2022-04-25 – 2022-04-30 (×11): 25 mg via ORAL
  Filled 2022-04-25 (×11): qty 1

## 2022-04-25 MED ORDER — PIPERACILLIN-TAZOBACTAM 3.375 G IVPB 30 MIN
3.3750 g | Freq: Once | INTRAVENOUS | Status: AC
  Administered 2022-04-25: 3.375 g via INTRAVENOUS
  Filled 2022-04-25 (×2): qty 50

## 2022-04-25 MED ORDER — ACETAMINOPHEN 325 MG PO TABS
650.0000 mg | ORAL_TABLET | ORAL | Status: DC | PRN
  Administered 2022-04-25 – 2022-04-28 (×3): 650 mg via ORAL
  Filled 2022-04-25 (×3): qty 2

## 2022-04-25 NOTE — Progress Notes (Signed)
NAME:  Adrian Bell, MRN:  [id number], DOB:  Feb 09, 1946, LOS: 52 ADMISSION DATE:  04/15/2022, CONSULTATION DATE:  2/26 REFERRING MD:  Tenny Craw, CHIEF COMPLAINT:  post-CABG   History of Present Illness:  Adrian Bell is a 77 y/o gentleman with a history of CAD s/p previous PCI who presented to [facility] from [place] with an NSTEMI. He presented with left-sided chest pain that was worse than his typical intermittent left chest pain, requiring 3 SL NTG to relieve his pain. He recently has endorsed exertional dyspnea as well. He underwent LHC upon transfer to [facility] and was found to have severe 3-vessel disease. He was evaluated by Cardiothoracic surgery for consideration of bypass surgery. At baseline he is active and independent. He has been managed with his baseline cardiac medications and heparin until today. EF is preserved. Cardiac risk factors include previous use of smokeless tobacco, HTN, HLD, age, gender. He underwent CABG x 3, LCX end-arterectomy, LAA clipping, and empiric thymectomy given history of MG.  Post-operatively he is on epinephrine. Last dose of paralytics was noon today; before leaving the OR he had only 1/4 twitches. Total rocuronium dose today '130mg'$ .   He has a history of MG which was diagnosed in 2018. His symptoms include diplopia, dysarthria, dysphagia. His maintenance regimen for MG is porednisone '10mg'$  daily with 1 previous flare since diagnosis. He has had an adverse reaction to IVIG in the past and was discontinued from this maintenance regimen in 12/2021.   Pertinent  Medical History  CAD HTN HLD MG on prednisone '10mg'$  daily nephrolithiasis  Significant Hospital Events: Including procedures, antibiotic start and stop dates in addition to other pertinent events   2/23 admission, LHC 2/26 CABG x 3, Lcx end-arterectomy, LAA clip, thymectomy 3/2 possible transfer out of ICU   Interim History / Subjective:   No issues overnight   Objective   Blood pressure (!) 146/75, pulse (!) 106,  temperature 98.5 F (36.9 C), temperature source Oral, resp. rate (!) 29, height '5\' 6"'$  (1.676 m), weight 82.3 kg, SpO2 93 %.        Intake/Output Summary (Last 24 hours) at 04/25/2022 [id number] Last data filed at 04/25/2022 0800 Gross per 24 hour  Intake 540 ml  Output 800 ml  Net -260 ml   Filed Weights   04/23/22 0600 04/24/22 0500 04/25/22 0500  Weight: 84 kg 83.1 kg 82.3 kg    Examination: General: Elderly gentleman sitting up in chair no distress HEENT: NCAT, tracking appropriately Neuro: Alert oriented x 4 CV: Regular rate rhythm S1-S2 PULM: Clear to auscultate bilaterally GI: Soft nontender nondistended Extremities: No significant edema  Resolved Hospital Problem list     Assessment & Plan:    3v CAD with NSTEMI s/p CABG x3 + end arterectomy Lcx, LAA clipping, thymectomy  P: Mobility  Pain mtg  Metoprolol, statin  DAPT   MG, without acute exacerbation  Chronic steroid use  -follow NIF and VC, both good yesterday  -oupt neuro f/u re steroids (now s/p thymectomy)   Hyperglycemia - CBG + SSI   Expected post op ABLA  -follow   GERD - PPI H2B   PCCM will follow while in ICU.   Best Practice (right click and "Reselect all SmartList Selections" daily)   Diet/type: Regular consistency (see orders) DVT prophylaxis: LMWH GI prophylaxis: H2B and PPI Lines: N/A Foley:  N/A Code Status:  full code Last date of multidisciplinary goals of care discussion [ per primary ]  Labs   CBC: Recent Labs  Lab 04/21/22 0503 04/22/22 0747 04/23/22 1105 04/24/22 0108 04/25/22 0123  WBC 12.4* 15.4* 11.5* 11.2* 11.5*  HGB 7.9* 8.4* 8.9* 8.5* 7.9*  HCT 24.9* 26.2* 27.9* 25.5* 23.9*  MCV 92.2 93.2 91.8 91.4 90.5  PLT 142* 176 228 210 [id number]    Basic Metabolic Panel: Recent Labs  Lab 04/19/22 2104 04/20/22 0345 04/20/22 0751 04/20/22 1657 04/21/22 0503 04/22/22 0747 04/23/22 1105 04/24/22 0108 04/25/22 0123  NA 142 139  143   < > 140 138 139 138 137 136  K  4.2 4.2  4.3   < > 3.6 3.7 4.0 3.7 3.8 4.2  CL 112* 109  --  107 106 104 99 99 99  CO2 21* 19*  --  '22 23 24 27 '$ 20* 27  GLUCOSE 142* 175*  --  144* 122* 178* 150* 99 116*  BUN 15 15  --  15 18 27* 21 21 26*  CREATININE 0.90 1.08  --  1.14 0.98 1.21 1.05 0.95 1.08  CALCIUM 7.8* 7.7*  --  8.1* 8.4* 8.5* 8.5* 8.5* 8.1*  MG 2.2 2.1  --  2.0  --   --   --   --   --    < > = values in this interval not displayed.   GFR: Estimated Creatinine Clearance: 58.6 mL/min (by C-G formula based on SCr of 1.08 mg/dL). Recent Labs  Lab 04/22/22 0747 04/23/22 1105 04/24/22 0108 04/25/22 0123  WBC 15.4* 11.5* 11.2* 11.5*    ABG    Component Value Date/Time   PHART 7.339 (L) 04/20/2022 0751   PCO2ART 30.0 (L) 04/20/2022 0751   PO2ART 153 (H) 04/20/2022 0751   HCO3 16.1 (L) 04/20/2022 0751   TCO2 17 (L) 04/20/2022 0751   ACIDBASEDEF 9.0 (H) 04/20/2022 0751   O2SAT 57.8 04/21/2022 0503     Critical care time: n/a     Garner Nash, DO [facility] 04/25/2022 9:24 AM

## 2022-04-25 NOTE — Progress Notes (Signed)
[facility] Progress Note Patient Name: Adrian Bell DOB: [birth year] MRN: [id number]   Date of Service  04/25/2022  HPI/Events of Note  Notified of fever Tmax 101.4F.   Pt is post op day 6 CABG x 3, Lcx end-arterectomy, LAA clip, thymectomy.  Pt is on room air and is hemodynamically stable.   MRSA negative.  eICU Interventions  Sepsis work up. Check blood culture, urinalysis, procalcitonin, cbc.  Get CXR.  Will start on empiric antibiotic with Zosyn.      Intervention Category Major Interventions: Sepsis - evaluation and management  Elsie Lincoln 04/25/2022, 8:53 PM  12:52 AM WBC is 15 <-- 11 with elevated procalcitonin.  Urinalysis with no nitrites or leuk esterase.  CXR with stable retrocardiac opacity.  Plan> Continue empiric antibiotics.  Follow up cultures.

## 2022-04-25 NOTE — Progress Notes (Signed)
Patient ID: Adrian Bell, male   DOB: May 03, 1945, 77 y.o.   MRN: [id number]  TCTS Evening Rounds:  Hemodynamically stable in sinus rhythm.  Had another BM today.  UO ok  Sats 98% RA.  Ambulated well.  Plan transfer to 4E in am.

## 2022-04-25 NOTE — Progress Notes (Signed)
6 Days Post-Op Procedure(s) (LRB): CORONARY ARTERY BYPASS GRAFTING (CABG) X3 USING LEFT INTERNAL MAMMARY ARTERY (LIMA) AND ENDOSCOPICALLY HARVESTED RIGHT GREATER SAPHENOUS VEIN AND CORONARY ENDARTERECTOMY (N/A) TRANSESOPHAGEAL ECHOCARDIOGRAM (N/A) THYMECTOMY (N/A) CLIPPING OF ATRIAL APPENDAGE USING ATRICURE 40MM ATRICLIP Subjective: Had some nausea this am after Miralax but feels better now. Had 3 BM's yesterday.  Objective: Vital signs in last 24 hours: Temp:  [98.2 F (36.8 C)-98.5 F (36.9 C)] 98.5 F (36.9 C) (03/02 2345) Pulse Rate:  [75-114] 112 (03/03 1000) Cardiac Rhythm: Sinus tachycardia (03/03 0800) Resp:  [14-33] 27 (03/03 1000) BP: (108-155)/(64-80) 145/78 (03/03 1000) SpO2:  [92 %-97 %] 95 % (03/03 1000) Weight:  [82.3 kg] 82.3 kg (03/03 0500)  Hemodynamic parameters for last 24 hours:    Intake/Output from previous day: 03/02 0701 - 03/03 0700 In: 720 [P.O.:720] Out: 800 [Urine:800] Intake/Output this shift: Total I/O In: 60 [P.O.:60] Out: 375 [Urine:375]  General appearance: alert and cooperative Neurologic: intact Heart: regular rate and rhythm, S1, S2 normal, no murmur Lungs: clear to auscultation bilaterally Abdomen: soft, non-tender; bowel sounds normal Extremities: edema mild Wound: incision healing well  Lab Results: Recent Labs    04/24/22 0108 04/25/22 0123  WBC 11.2* 11.5*  HGB 8.5* 7.9*  HCT 25.5* 23.9*  PLT 210 248   BMET:  Recent Labs    04/24/22 0108 04/25/22 0123  NA 137 136  K 3.8 4.2  CL 99 99  CO2 20* 27  GLUCOSE 99 116*  BUN 21 26*  CREATININE 0.95 1.08  CALCIUM 8.5* 8.1*    PT/INR: No results for input(s): "LABPROT", "INR" in the last 72 hours. ABG    Component Value Date/Time   PHART 7.339 (L) 04/20/2022 0751   HCO3 16.1 (L) 04/20/2022 0751   TCO2 17 (L) 04/20/2022 0751   ACIDBASEDEF 9.0 (H) 04/20/2022 0751   O2SAT 57.8 04/21/2022 0503   CBG (last 3)  Recent Labs    04/24/22 1123 04/24/22 1604  04/25/22 0641  GLUCAP 117* 138* 133*    Assessment/Plan: S/P Procedure(s) (LRB): CORONARY ARTERY BYPASS GRAFTING (CABG) X3 USING LEFT INTERNAL MAMMARY ARTERY (LIMA) AND ENDOSCOPICALLY HARVESTED RIGHT GREATER SAPHENOUS VEIN AND CORONARY ENDARTERECTOMY (N/A) TRANSESOPHAGEAL ECHOCARDIOGRAM (N/A) THYMECTOMY (N/A) CLIPPING OF ATRIAL APPENDAGE USING ATRICURE 40MM ATRICLIP  POD 6  Hemodynamically stable in sinus rhythm. Will increase Lopressor to 25 bid.  Volume excess: Wt down 2 lbs. 7 lbs over preop.  Possible ileus resolved.  Postop anemia: start iron.  Transfer to 4E and continue mobilization, IS.   LOS: 10 days    Adrian Bell 04/25/2022

## 2022-04-26 ENCOUNTER — Inpatient Hospital Stay (HOSPITAL_COMMUNITY): Payer: Non-veteran care

## 2022-04-26 ENCOUNTER — Encounter (HOSPITAL_COMMUNITY): Payer: Self-pay | Admitting: Cardiothoracic Surgery

## 2022-04-26 DIAGNOSIS — I3139 Other pericardial effusion (noninflammatory): Secondary | ICD-10-CM | POA: Diagnosis not present

## 2022-04-26 DIAGNOSIS — I214 Non-ST elevation (NSTEMI) myocardial infarction: Secondary | ICD-10-CM | POA: Diagnosis not present

## 2022-04-26 LAB — POCT I-STAT EG7
Acid-Base Excess: 1 mmol/L (ref 0.0–2.0)
Bicarbonate: 25.6 mmol/L (ref 20.0–28.0)
Calcium, Ion: 1.13 mmol/L — ABNORMAL LOW (ref 1.15–1.40)
HCT: 27 % — ABNORMAL LOW (ref 39.0–52.0)
Hemoglobin: 9.2 g/dL — ABNORMAL LOW (ref 13.0–17.0)
O2 Saturation: 32 %
Patient temperature: 99
Potassium: 4.2 mmol/L (ref 3.5–5.1)
Sodium: 136 mmol/L (ref 135–145)
TCO2: 27 mmol/L (ref 22–32)
pCO2, Ven: 39.3 mmHg — ABNORMAL LOW (ref 44–60)
pH, Ven: 7.422 (ref 7.25–7.43)
pO2, Ven: 20 mmHg — CL (ref 32–45)

## 2022-04-26 LAB — BLOOD CULTURE ID PANEL (REFLEXED) - BCID2

## 2022-04-26 LAB — URINALYSIS, ROUTINE W REFLEX MICROSCOPIC
Bacteria, UA: NONE SEEN
Bilirubin Urine: NEGATIVE
Glucose, UA: 50 mg/dL — AB
Ketones, ur: NEGATIVE mg/dL
Leukocytes,Ua: NEGATIVE
Nitrite: NEGATIVE
Protein, ur: 100 mg/dL — AB
Specific Gravity, Urine: 1.027 (ref 1.005–1.030)
pH: 5 (ref 5.0–8.0)

## 2022-04-26 LAB — BASIC METABOLIC PANEL
Anion gap: 9 (ref 5–15)
BUN: 23 mg/dL (ref 8–23)
CO2: 26 mmol/L (ref 22–32)
Calcium: 8.1 mg/dL — ABNORMAL LOW (ref 8.9–10.3)
Chloride: 100 mmol/L (ref 98–111)
Creatinine, Ser: 1.22 mg/dL (ref 0.61–1.24)
GFR, Estimated: >60 mL/min (ref >60)
Glucose, Bld: 136 mg/dL — ABNORMAL HIGH (ref 70–99)
Potassium: 4.5 mmol/L (ref 3.5–5.1)
Sodium: 135 mmol/L (ref 135–145)

## 2022-04-26 LAB — CBC
HCT: 21.7 % — ABNORMAL LOW (ref 39.0–52.0)
Hemoglobin: 7.2 g/dL — ABNORMAL LOW (ref 13.0–17.0)
MCH: 29.9 pg (ref 26.0–34.0)
MCHC: 33.2 g/dL (ref 30.0–36.0)
MCV: 90 fL (ref 80.0–100.0)
Platelets: 193 10*3/uL (ref 150–400)
RBC: 2.41 MIL/uL — ABNORMAL LOW (ref 4.22–5.81)
RDW: 14.6 % (ref 11.5–15.5)
WBC: 15.5 10*3/uL — ABNORMAL HIGH (ref 4.0–10.5)
nRBC: 0.3 % — ABNORMAL HIGH (ref 0.0–0.2)

## 2022-04-26 LAB — ECHOCARDIOGRAM COMPLETE BUBBLE STUDY
AR max vel: 2.05 cm2
AV Area VTI: 2.22 cm2
AV Area mean vel: 2.11 cm2
AV Mean grad: 3.7 mmHg
AV Peak grad: 7.2 mmHg
Ao pk vel: 1.34 m/s
Area-P 1/2: 4.89 cm2
Calc EF: 59.7 %
S' Lateral: 3.5 cm
Single Plane A2C EF: 64.5 %
Single Plane A4C EF: 55.3 %

## 2022-04-26 MED ORDER — SODIUM CHLORIDE 0.9 % IV SOLN
2.0000 g | Freq: Two times a day (BID) | INTRAVENOUS | Status: DC
  Administered 2022-04-26 – 2022-04-27 (×2): 2 g via INTRAVENOUS
  Filled 2022-04-26 (×2): qty 12.5

## 2022-04-26 MED ORDER — PIPERACILLIN-TAZOBACTAM 3.375 G IVPB
3.3750 g | Freq: Three times a day (TID) | INTRAVENOUS | Status: DC
  Administered 2022-04-26: 3.375 g via INTRAVENOUS
  Filled 2022-04-26: qty 50

## 2022-04-26 MED ORDER — PERFLUTREN LIPID MICROSPHERE
1.0000 mL | INTRAVENOUS | Status: AC | PRN
  Administered 2022-04-26: 2 mL via INTRAVENOUS

## 2022-04-26 NOTE — Progress Notes (Signed)
PHARMACY - PHYSICIAN COMMUNICATION CRITICAL VALUE ALERT - BLOOD CULTURE IDENTIFICATION (BCID)  Adrian Bell is an 77 y.o. male who presented to [facility] on 04/15/2022 with a chief complaint of NSTEMI.  Assessment:  77 year old male admitted for NSTEMI. Underwent CABG 2/26. Noted to have new onset fever 3/3. Now with gram negative rods in 2/2 blood cultures with enterobactorales species on the BCID.   Name of physician (or Provider) Contacted: 2H pharmacist/Enter/[place]   Current antibiotics: Zosyn   Changes to prescribed antibiotics recommended:  Change to cefepime to cover wider array of Enterobactorales species including those that may have an AmpC resistance gene  Results for orders placed or performed during the hospital encounter of 04/15/22  Blood Culture ID Panel (Reflexed) (Collected: 04/25/2022  9:18 PM)  Result Value Ref Range   Enterococcus faecalis NOT DETECTED NOT DETECTED   Enterococcus Faecium NOT DETECTED NOT DETECTED   Listeria monocytogenes NOT DETECTED NOT DETECTED   Staphylococcus species NOT DETECTED NOT DETECTED   Staphylococcus aureus (BCID) NOT DETECTED NOT DETECTED   Staphylococcus epidermidis NOT DETECTED NOT DETECTED   Staphylococcus lugdunensis NOT DETECTED NOT DETECTED   Streptococcus species NOT DETECTED NOT DETECTED   Streptococcus agalactiae NOT DETECTED NOT DETECTED   Streptococcus pneumoniae NOT DETECTED NOT DETECTED   Streptococcus pyogenes NOT DETECTED NOT DETECTED   A.calcoaceticus-baumannii NOT DETECTED NOT DETECTED   Bacteroides fragilis NOT DETECTED NOT DETECTED   Enterobacterales DETECTED (A) NOT DETECTED   Enterobacter cloacae complex NOT DETECTED NOT DETECTED   Escherichia coli NOT DETECTED NOT DETECTED   Klebsiella aerogenes NOT DETECTED NOT DETECTED   Klebsiella oxytoca NOT DETECTED NOT DETECTED   Klebsiella pneumoniae NOT DETECTED NOT DETECTED   Proteus species NOT DETECTED NOT DETECTED   Salmonella species NOT DETECTED NOT DETECTED    Serratia marcescens NOT DETECTED NOT DETECTED   Haemophilus influenzae NOT DETECTED NOT DETECTED   Neisseria meningitidis NOT DETECTED NOT DETECTED   Pseudomonas aeruginosa NOT DETECTED NOT DETECTED   Stenotrophomonas maltophilia NOT DETECTED NOT DETECTED   Candida albicans NOT DETECTED NOT DETECTED   Candida auris NOT DETECTED NOT DETECTED   Candida glabrata NOT DETECTED NOT DETECTED   Candida krusei NOT DETECTED NOT DETECTED   Candida parapsilosis NOT DETECTED NOT DETECTED   Candida tropicalis NOT DETECTED NOT DETECTED   Cryptococcus neoformans/gattii NOT DETECTED NOT DETECTED   CTX-M ESBL NOT DETECTED NOT DETECTED   Carbapenem resistance IMP NOT DETECTED NOT DETECTED   Carbapenem resistance KPC NOT DETECTED NOT DETECTED   Carbapenem resistance NDM NOT DETECTED NOT DETECTED   Carbapenem resist OXA 48 LIKE NOT DETECTED NOT DETECTED   Carbapenem resistance VIM NOT DETECTED NOT DETECTED    Susa Raring 04/26/2022  12:03 PM

## 2022-04-26 NOTE — Progress Notes (Signed)
EVENING ROUNDS NOTE :     [address]       [place] [address]             [phone]                 7 Days Post-Op Procedure(s) (LRB): CORONARY ARTERY BYPASS GRAFTING (CABG) X3 USING LEFT INTERNAL MAMMARY ARTERY (LIMA) AND ENDOSCOPICALLY HARVESTED RIGHT GREATER SAPHENOUS VEIN AND CORONARY ENDARTERECTOMY (N/A) TRANSESOPHAGEAL ECHOCARDIOGRAM (N/A) THYMECTOMY (N/A) CLIPPING OF ATRIAL APPENDAGE USING ATRICURE 40MM ATRICLIP   Total Length of Stay:  LOS: 11 days  Events:   No events Echo completed, images pending    BP 110/63   Pulse 74   Temp (!) 101.8 F (38.8 C) (Oral)   Resp (!) 21   Ht '5\' 6"'$  (1.676 m)   Wt 81.6 kg   SpO2 99%   BMI 29.04 kg/m          ceFEPime (MAXIPIME) IV Stopped (04/26/22 1331)    I/O last 3 completed shifts: In: 540 [P.O.:540] Out: 1175 [Urine:1175]      Latest Ref Rng & Units 04/26/2022    8:39 AM 04/26/2022   12:18 AM 04/25/2022    9:20 PM  CBC  WBC 4.0 - 10.5 K/uL  15.5  15.4   Hemoglobin 13.0 - 17.0 g/dL 9.2  7.2  7.3   Hematocrit 39.0 - 52.0 % 27.0  21.7  21.4   Platelets 150 - 400 K/uL  193  200        Latest Ref Rng & Units 04/26/2022    8:39 AM 04/26/2022   12:18 AM 04/25/2022    1:23 AM  BMP  Glucose 70 - 99 mg/dL  136  116   BUN 8 - 23 mg/dL  23  26   Creatinine 0.61 - 1.24 mg/dL  1.22  1.08   Sodium 135 - 145 mmol/L 136  135  136   Potassium 3.5 - 5.1 mmol/L 4.2  4.5  4.2   Chloride 98 - 111 mmol/L  100  99   CO2 22 - 32 mmol/L  26  27   Calcium 8.9 - 10.3 mg/dL  8.1  8.1     ABG    Component Value Date/Time   PHART 7.339 (L) 04/20/2022 0751   PCO2ART 30.0 (L) 04/20/2022 0751   PO2ART 153 (H) 04/20/2022 0751   HCO3 25.6 04/26/2022 0839   TCO2 27 04/26/2022 0839   ACIDBASEDEF 9.0 (H) 04/20/2022 0751   O2SAT 32 04/26/2022 0839       Melodie Bouillon, MD 04/26/2022 4:59 PM

## 2022-04-26 NOTE — Progress Notes (Signed)
Critical care   Unexpected finding of 4/4 positive blood cultures for GNR.   No contributory symptoms apart from history of prostatism. No clear urinary symptoms a present.   CT abdomen show non-obstructive renal calculi but no clear signs of pyelonephritis.   Echo shows normal EF, no effusion.   Complete 7  days of antibiotics for complicated UTI.  Kipp Brood, MD [facility] ICU Physician [place]  Pager: [phone] Or Epic Secure Chat After hours: [phone].  04/26/2022, 5:07 PM

## 2022-04-26 NOTE — Progress Notes (Signed)
NAME:  Adrian Bell, MRN:  [id number], DOB:  Mar 04, 1945, LOS: 68 ADMISSION DATE:  04/15/2022, CONSULTATION DATE:  2/26 REFERRING MD:  Tenny Craw, CHIEF COMPLAINT:  post-CABG   History of Present Illness:  Adrian Bell is a 77 y/o gentleman with a history of CAD s/p previous PCI who presented to [facility] from [place] with an NSTEMI. He presented with left-sided chest pain that was worse than his typical intermittent left chest pain, requiring 3 SL NTG to relieve his pain. He recently has endorsed exertional dyspnea as well. He underwent LHC upon transfer to [facility] and was found to have severe 3-vessel disease. He was evaluated by Cardiothoracic surgery for consideration of bypass surgery. At baseline he is active and independent. He has been managed with his baseline cardiac medications and heparin until today. EF is preserved. Cardiac risk factors include previous use of smokeless tobacco, HTN, HLD, age, gender. He underwent CABG x 3, LCX end-arterectomy, LAA clipping, and empiric thymectomy given history of MG.  Post-operatively he is on epinephrine. Last dose of paralytics was noon today; before leaving the OR he had only 1/4 twitches. Total rocuronium dose today '130mg'$ .   He has a history of MG which was diagnosed in 2018. His symptoms include diplopia, dysarthria, dysphagia. His maintenance regimen for MG is porednisone '10mg'$  daily with 1 previous flare since diagnosis. He has had an adverse reaction to IVIG in the past and was discontinued from this maintenance regimen in 12/2021.   Pertinent  Medical History  CAD HTN HLD MG on prednisone '10mg'$  daily nephrolithiasis  Significant Hospital Events: Including procedures, antibiotic start and stop dates in addition to other pertinent events   2/23 admission, LHC 2/26 CABG x 3, Lcx end-arterectomy, LAA clip, thymectomy 3/2 possible transfer out of ICU   Interim History / Subjective:  Started on antibiotics for isolated fever overnight. Patient feels well. Denies  chest pain. Not been using IS consistently.   Objective   Blood pressure 131/73, pulse 90, temperature 99 F (37.2 C), temperature source Oral, resp. rate (!) 31, height '5\' 6"'$  (1.676 m), weight 81.6 kg, SpO2 100 %.        Intake/Output Summary (Last 24 hours) at 04/26/2022 0921 Last data filed at 04/25/2022 2300 Gross per 24 hour  Intake 240 ml  Output 525 ml  Net -285 ml    Filed Weights   04/24/22 0500 04/25/22 0500 04/26/22 0500  Weight: 83.1 kg 82.3 kg 81.6 kg    Examination: General: Elderly gentleman in bed.  HEENT: NCAT, tracking appropriately Neuro: Alert oriented x 4 CV: Regular rate rhythm S1-S2, no rub.  PULM: Crackles at left base. Pulling close to 1/L with IS GI: Soft nontender nondistended Extremities: No significant edema  Ancillary tests personally reviewed:   CXR from yesterday shows left lower lobe infiltrate.  Creatinine 1.2 WBC 15.5 Assessment & Plan:   3v CAD with NSTEMI s/p CABG x3 + end arterectomy Lcx, LAA clipping, thymectomy  Post-operative atelectasis.  MG, without acute exacerbation  Chronic steroid use  Hyperglycemia Expected post op ABLA  GERD  Plan: - Encourage ambulation and IS use.  - No diuresis today.  - Isolated fever with leukocytosis. Appears to be due to post-operative state without clear source of infection. If echo normal and able to ambulate, will stop.  - Continue current beta-blocker dose, avoid increase due to MG.  - Continue current dose of prednisone for MG. Following MG clinically.   PCCM will follow while in ICU.   Best  Practice (right click and "Reselect all SmartList Selections" daily)   Diet/type: Regular consistency (see orders) DVT prophylaxis: LMWH GI prophylaxis: H2B and PPI Lines: N/A Foley:  N/A Code Status:  full code Last date of multidisciplinary goals of care discussion [ per primary ]  Adrian Brood, MD [facility] ICU Physician [place]  Pager: [phone] Or Epic Secure  Chat After hours: [phone].  04/26/2022, 10:11 AM

## 2022-04-26 NOTE — Progress Notes (Addendum)
[address]       [place], [address]             [phone]      7 Days Post-Op Procedure(s) (LRB): CORONARY ARTERY BYPASS GRAFTING (CABG) X3 USING LEFT INTERNAL MAMMARY ARTERY (LIMA) AND ENDOSCOPICALLY HARVESTED RIGHT GREATER SAPHENOUS VEIN AND CORONARY ENDARTERECTOMY (N/A) TRANSESOPHAGEAL ECHOCARDIOGRAM (N/A) THYMECTOMY (N/A) CLIPPING OF ATRIAL APPENDAGE USING ATRICURE 40MM ATRICLIP  Subjective:  Patient sitting up in chair.  Having chills, states he is cold.  Experienced dry heaves/nausea yesterday.  Multiple BM after Miralax  Objective: Vital signs in last 24 hours: Temp:  [97.7 F (36.5 C)-101.8 F (38.8 C)] 98.6 F (37 C) (03/04 0400) Pulse Rate:  [75-112] 90 (03/04 0606) Cardiac Rhythm: Normal sinus rhythm;Sinus tachycardia (03/03 2000) Resp:  [22-34] 31 (03/04 0606) BP: (108-149)/(52-78) 131/73 (03/04 0606) SpO2:  [92 %-100 %] 100 % (03/04 0606) Weight:  [81.6 kg] 81.6 kg (03/04 0500)  Intake/Output from previous day: 03/03 0701 - 03/04 0700 In: 300 [P.O.:300] Out: 525 [Urine:525]  General appearance: alert, cooperative, and no distress Heart: regular rate and rhythm and tachy Lungs: diminished breath sounds bibasilar Abdomen: + distention, hypoactive BS, non-tender Extremities: edema + pitting edema Wound: clean and dry, small area of enlargement along top of sternotomy, marked and will monitor  Lab Results: Recent Labs    04/25/22 2120 04/26/22 0018  WBC 15.4* 15.5*  HGB 7.3* 7.2*  HCT 21.4* 21.7*  PLT 200 193   BMET:  Recent Labs    04/25/22 0123 04/26/22 0018  NA 136 135  K 4.2 4.5  CL 99 100  CO2 27 26  GLUCOSE 116* 136*  BUN 26* 23  CREATININE 1.08 1.22  CALCIUM 8.1* 8.1*    PT/INR: No results for input(s): "LABPROT", "INR" in the last 72 hours. ABG    Component Value Date/Time   PHART 7.339 (L) 04/20/2022 0751   HCO3 16.1 (L) 04/20/2022 0751   TCO2 17 (L) 04/20/2022 0751   ACIDBASEDEF 9.0 (H) 04/20/2022  0751   O2SAT 57.8 04/21/2022 0503   CBG (last 3)  Recent Labs    04/24/22 1123 04/24/22 1604 04/25/22 0641  GLUCAP 117* 138* 133*    Assessment/Plan: S/P Procedure(s) (LRB): CORONARY ARTERY BYPASS GRAFTING (CABG) X3 USING LEFT INTERNAL MAMMARY ARTERY (LIMA) AND ENDOSCOPICALLY HARVESTED RIGHT GREATER SAPHENOUS VEIN AND CORONARY ENDARTERECTOMY (N/A) TRANSESOPHAGEAL ECHOCARDIOGRAM (N/A) THYMECTOMY (N/A) CLIPPING OF ATRIAL APPENDAGE USING ATRICURE 40MM ATRICLIP  CV- Sinus Tachycardia, Lopressor was initiated over the weekend currently at 25 mg BID Pulm- no acute issues, CXR with atelectasis, he does appear winded this morning, oxygen saturations are acceptable and not requiring O2 Renal- creatinine is at 1.22, weight is minimally elevated at 179lbs, pitting edema on exam, suspect some component could be third spacing.. not currently on diuretics will monitor, ID- became febrile overnight with temp peaking at 101.8, not currently febrile.Marland Kitchen UA was negative, blood cultures are pending, wound cultures are free from evidence of infection, other than top area of sternotomy, ? Suspect hematoma will monitor.. on empiric ABX per CCM Expected post operative blood loss anemia, Hgb at 7.2 on iron supplement Myasthenia Gravis- on baseline steroids Dispo- patient was doing well, became febrile overnight, sepsis workup on going, remains in NSR, originally per weekend notes, plans were to transfer to 4E, will defer to Dr. Tenny Craw    LOS: 11 days    Ellwood Handler, PA-C 04/26/2022   TCTS Progress Note: 7 Days Post-Op  Procedure(s) (LRB): CORONARY ARTERY BYPASS GRAFTING (CABG) X3 USING LEFT INTERNAL MAMMARY ARTERY (LIMA) AND ENDOSCOPICALLY HARVESTED RIGHT GREATER SAPHENOUS VEIN AND CORONARY ENDARTERECTOMY (N/A) TRANSESOPHAGEAL ECHOCARDIOGRAM (N/A) THYMECTOMY (N/A) CLIPPING OF ATRIAL APPENDAGE USING ATRICURE 40MM ATRICLIP  LOS: 11 days   E-link was called overnight and started abx  Looks well this AM  but not as peppy as late last week.   Per nursing - may not have received dose of ABX ordered for fever as his IV infiltrated.   Sitting up conversational, not energetic Abd less distended, nontender 1-2+ LE edema  AP Plan was to transfer yesterday aM Doesn't look as good this AM - keep in ICU IV antibiotics Echo - Barrett ordered.  Will need to ensure he's been receiving daily plavix. Don't want to go to an IV agent but may need to if not ingesting.  Myasthenia and general weakness from this is still on the radar.       Latest Ref Rng & Units 04/26/2022    8:39 AM 04/26/2022   12:18 AM 04/25/2022    9:20 PM  CBC  WBC 4.0 - 10.5 K/uL  15.5  15.4   Hemoglobin 13.0 - 17.0 g/dL 9.2  7.2  7.3   Hematocrit 39.0 - 52.0 % 27.0  21.7  21.4   Platelets 150 - 400 K/uL  193  200        Latest Ref Rng & Units 04/26/2022    8:39 AM 04/26/2022   12:18 AM 04/25/2022    1:23 AM  CMP  Glucose 70 - 99 mg/dL  136  116   BUN 8 - 23 mg/dL  23  26   Creatinine 0.61 - 1.24 mg/dL  1.22  1.08   Sodium 135 - 145 mmol/L 136  135  136   Potassium 3.5 - 5.1 mmol/L 4.2  4.5  4.2   Chloride 98 - 111 mmol/L  100  99   CO2 22 - 32 mmol/L  26  27   Calcium 8.9 - 10.3 mg/dL  8.1  8.1     ABG    Component Value Date/Time   PHART 7.339 (L) 04/20/2022 0751   PCO2ART 30.0 (L) 04/20/2022 0751   PO2ART 153 (H) 04/20/2022 0751   HCO3 25.6 04/26/2022 0839   TCO2 27 04/26/2022 0839   ACIDBASEDEF 9.0 (H) 04/20/2022 0751   O2SAT 32 04/26/2022 0839

## 2022-04-26 NOTE — Progress Notes (Signed)
  Echocardiogram 2D Echocardiogram has been performed.  Lana Fish 04/26/2022, 4:32 PM

## 2022-04-27 DIAGNOSIS — I214 Non-ST elevation (NSTEMI) myocardial infarction: Secondary | ICD-10-CM | POA: Diagnosis not present

## 2022-04-27 LAB — BASIC METABOLIC PANEL
Anion gap: 8 (ref 5–15)
BUN: 25 mg/dL — ABNORMAL HIGH (ref 8–23)
CO2: 26 mmol/L (ref 22–32)
Calcium: 8.3 mg/dL — ABNORMAL LOW (ref 8.9–10.3)
Chloride: 102 mmol/L (ref 98–111)
Creatinine, Ser: 1.11 mg/dL (ref 0.61–1.24)
GFR, Estimated: >60 mL/min (ref >60)
Glucose, Bld: 130 mg/dL — ABNORMAL HIGH (ref 70–99)
Potassium: 4.3 mmol/L (ref 3.5–5.1)
Sodium: 136 mmol/L (ref 135–145)

## 2022-04-27 LAB — CBC
HCT: 23.7 % — ABNORMAL LOW (ref 39.0–52.0)
Hemoglobin: 7.4 g/dL — ABNORMAL LOW (ref 13.0–17.0)
MCH: 29 pg (ref 26.0–34.0)
MCHC: 31.2 g/dL (ref 30.0–36.0)
MCV: 92.9 fL (ref 80.0–100.0)
Platelets: 189 10*3/uL (ref 150–400)
RBC: 2.55 MIL/uL — ABNORMAL LOW (ref 4.22–5.81)
RDW: 14.8 % (ref 11.5–15.5)
WBC: 18.7 10*3/uL — ABNORMAL HIGH (ref 4.0–10.5)
nRBC: 0 % (ref 0.0–0.2)

## 2022-04-27 MED ORDER — SODIUM CHLORIDE 0.9 % IV SOLN
2.0000 g | Freq: Two times a day (BID) | INTRAVENOUS | Status: DC
  Administered 2022-04-27 – 2022-04-28 (×2): 2 g via INTRAVENOUS
  Filled 2022-04-27 (×2): qty 12.5

## 2022-04-27 MED ORDER — POLYETHYLENE GLYCOL 3350 17 G PO PACK
17.0000 g | PACK | Freq: Every day | ORAL | Status: AC
  Administered 2022-04-27: 17 g via ORAL
  Filled 2022-04-27: qty 1

## 2022-04-27 MED ORDER — RAMIPRIL 5 MG PO CAPS
5.0000 mg | ORAL_CAPSULE | Freq: Every day | ORAL | Status: DC
  Administered 2022-04-27 – 2022-04-30 (×4): 5 mg via ORAL
  Filled 2022-04-27 (×4): qty 1

## 2022-04-27 MED ORDER — SENNA 8.6 MG PO TABS
1.0000 | ORAL_TABLET | Freq: Every day | ORAL | Status: AC
  Administered 2022-04-27: 8.6 mg via ORAL
  Filled 2022-04-27 (×2): qty 1

## 2022-04-27 MED ORDER — BISACODYL 10 MG RE SUPP
10.0000 mg | Freq: Once | RECTAL | Status: AC
  Administered 2022-04-27: 10 mg via RECTAL
  Filled 2022-04-27: qty 1

## 2022-04-27 MED ORDER — SODIUM CHLORIDE 0.9 % IV SOLN
2.0000 g | INTRAVENOUS | Status: DC
  Administered 2022-04-27: 2 g via INTRAVENOUS
  Filled 2022-04-27: qty 20

## 2022-04-27 NOTE — Progress Notes (Signed)
NIF/VC performed with good effort. NIF -40 cmH2O VC  1.8L

## 2022-04-27 NOTE — Progress Notes (Signed)
Pt with good effort and technique did -40 NIF and 1.4L VC.

## 2022-04-27 NOTE — Progress Notes (Signed)
NAME:  Adrian Bell, MRN:  [id number], DOB:  10-09-1945, LOS: 38 ADMISSION DATE:  04/15/2022, CONSULTATION DATE:  2/26 REFERRING MD:  Tenny Craw, CHIEF COMPLAINT:  post-CABG   History of Present Illness:  Adrian Bell is a 77 y/o gentleman with a history of CAD s/p previous PCI who presented to [facility] from [place] with an NSTEMI. He presented with left-sided chest pain that was worse than his typical intermittent left chest pain, requiring 3 SL NTG to relieve his pain. He recently has endorsed exertional dyspnea as well. He underwent LHC upon transfer to [facility] and was found to have severe 3-vessel disease. He was evaluated by Cardiothoracic surgery for consideration of bypass surgery. At baseline he is active and independent. He has been managed with his baseline cardiac medications and heparin until today. EF is preserved. Cardiac risk factors include previous use of smokeless tobacco, HTN, HLD, age, gender. He underwent CABG x 3, LCX end-arterectomy, LAA clipping, and empiric thymectomy given history of MG.  Post-operatively he is on epinephrine. Last dose of paralytics was noon today; before leaving the OR he had only 1/4 twitches. Total rocuronium dose today '130mg'$ .   He has a history of MG which was diagnosed in 2018. His symptoms include diplopia, dysarthria, dysphagia. His maintenance regimen for MG is porednisone '10mg'$  daily with 1 previous flare since diagnosis. He has had an adverse reaction to IVIG in the past and was discontinued from this maintenance regimen in 12/2021.   Pertinent  Medical History  CAD HTN HLD MG on prednisone '10mg'$  daily nephrolithiasis  Significant Hospital Events: Including procedures, antibiotic start and stop dates in addition to other pertinent events   2/23 admission, LHC 2/26 CABG x 3, Lcx end-arterectomy, LAA clip, thymectomy 3/2 possible transfer out of ICU  3/4 4/4 cultures positive for Enterobacterales without resistance. CT shows no definitive source of  infection.  Interim History / Subjective:  Patient feels better than yesterday. Eager to get out of ICU  Objective   Blood pressure 139/74, pulse 85, temperature 98.8 F (37.1 C), temperature source Oral, resp. rate (!) 22, height '5\' 6"'$  (1.676 m), weight 81.5 kg, SpO2 95 %.        Intake/Output Summary (Last 24 hours) at 04/27/2022 0952 Last data filed at 04/27/2022 0600 Gross per 24 hour  Intake 301.6 ml  Output 550 ml  Net -248.4 ml    Filed Weights   04/25/22 0500 04/26/22 0500 04/27/22 0500  Weight: 82.3 kg 81.6 kg 81.5 kg    Examination: General: Elderly gentleman in bed.  HEENT: NCAT, tracking appropriately Neuro: Alert oriented x 4 CV: Regular rate rhythm S1-S2, no rub.  PULM: No crackles at left base. Pulling close to 1/L with IS GI: Soft nontender nondistended Extremities: No significant edema  Ancillary tests personally reviewed:   CT shows small stones in GB and kidney   Creatinine 1.11 WBC 18.7 HB 7.4 Assessment & Plan:   3v CAD with NSTEMI s/p CABG x3 + end arterectomy Lcx, LAA clipping, thymectomy GNR bacteremia without sepsis.   Post-operative atelectasis.  MG, without acute exacerbation  Chronic steroid use  Hyperglycemia Expected post op ABLA  GERD  Plan: - Encourage ambulation and IS use.  - No diuresis today.  - Complete 10 days of antibiotics for GNR bacteremia without source per literature (urine still most likely, followed by gut translocation from constipation, minor trauma from PM wires or chest tube insertion).  - Follow WBC, if leukocytosis fails to improve, would re-examine issue  of renal stones as possible source.  Consider outpatient colonoscopy  - Continue current beta-blocker dose, avoid increase due to MG.  - Continue current dose of prednisone for MG. Following MG clinically.   PCCM will follow while in ICU.   Best Practice (right click and "Reselect all SmartList Selections" daily)   Diet/type: Regular consistency (see  orders) DVT prophylaxis: LMWH GI prophylaxis: H2B and PPI Lines: N/A Foley:  N/A Code Status:  full code Last date of multidisciplinary goals of care discussion [ per primary ]  Kipp Brood, MD [facility] ICU Physician [place]  Pager: [phone] Or Epic Secure Chat After hours: [phone].  04/27/2022, 9:52 AM

## 2022-04-27 NOTE — Evaluation (Signed)
Physical Therapy Evaluation Patient Details Name: Adrian Bell MRN: [id number] DOB: 1945-03-07 Today's Date: 04/27/2022  History of Present Illness  Patient is a 77 y/o male who presents on 2/26 with NSTEMI, now s/p CABG x3 2/26. Found to have complicated UTI 3/6. PMH includes MG and MI.  Clinical Impression  Patient presents with fatigue and post surgical deficits s/p above surgery. Pt is independent and lives at home with his spouse. Loves to drive his golf cart and 4 wheeler and go camping. Today, pt requires Min A-supervision for bed mobility, transfers and ambulation. VSS on RA. Pt feels fatigued this morning after having an enema and trying to have a BM. Education re: "move in the tube" and sternal precautions.  Will have necessary support at home from wife and likely to progress well with increased activity/mobility. Will follow acutely to maximize independence and mobility prior to return home.     Recommendations for follow up therapy are one component of a multi-disciplinary discharge planning process, led by the attending physician.  Recommendations may be updated based on patient status, additional functional criteria and insurance authorization.  Follow Up Recommendations No PT follow up      Assistance Recommended at Discharge PRN  Patient can return home with the following  Help with stairs or ramp for entrance;Assist for transportation;A little help with walking and/or transfers;Assistance with cooking/housework;A little help with bathing/dressing/bathroom    Equipment Recommendations Rolling walker (2 wheels)  Recommendations for Other Services       Functional Status Assessment Patient has had a recent decline in their functional status and demonstrates the ability to make significant improvements in function in a reasonable and predictable amount of time.     Precautions / Restrictions Precautions Precautions: Fall Restrictions Weight Bearing Restrictions: Yes Other  Position/Activity Restrictions: sternal precautions      Mobility  Bed Mobility Overal bed mobility: Needs Assistance Bed Mobility: Sit to Sidelying, Rolling Rolling: Supervision       Sit to sidelying: Min assist General bed mobility comments: Cues for technique, some assist with LEs to bring into bed. HOB mostly flat.    Transfers Overall transfer level: Needs assistance Equipment used: Rolling walker (2 wheels) Transfers: Sit to/from Stand Sit to Stand: Supervision           General transfer comment: Supervision for safety. Stood from chair x1 with cues for hands on knees.    Ambulation/Gait Ambulation/Gait assistance: Min guard Gait Distance (Feet): 160 Feet Assistive device: Rolling walker (2 wheels) Gait Pattern/deviations: Step-through pattern, Decreased stride length, Drifts right/left Gait velocity: decreased Gait velocity interpretation: 1.31 - 2.62 ft/sec, indicative of limited community ambulator   General Gait Details: Slow, mildly unsteady gait with use of RW, 1 standing rest break. VSS on RA. 2/4 DOE.  Stairs            Wheelchair Mobility    Modified Rankin (Stroke Patients Only)       Balance Overall balance assessment: Mild deficits observed, not formally tested                                           Pertinent Vitals/Pain Pain Assessment Pain Assessment: No/denies pain    Home Living Family/patient expects to be discharged to:: Private residence Living Arrangements: Spouse/significant other Available Help at Discharge: Family;Available PRN/intermittently Type of Home: House Home Access: Stairs to enter   Entrance  Stairs-Number of Steps: a couple   Home Layout: One level Home Equipment: None      Prior Function Prior Level of Function : Independent/Modified Independent             Mobility Comments: independent, drives, does not do IADLs. Has a golf cart and 4 wheeler, loves to camp ADLs Comments:  independent     Hand Dominance   Dominant Hand: Right    Extremity/Trunk Assessment   Upper Extremity Assessment Upper Extremity Assessment: Defer to OT evaluation    Lower Extremity Assessment Lower Extremity Assessment: Generalized weakness (but functional)    Cervical / Trunk Assessment Cervical / Trunk Assessment: Normal  Communication   Communication: No difficulties  Cognition Arousal/Alertness: Awake/alert Behavior During Therapy: WFL for tasks assessed/performed Overall Cognitive Status: Within Functional Limits for tasks assessed                                          General Comments General comments (skin integrity, edema, etc.): VSS on RA.    Exercises     Assessment/Plan    PT Assessment Patient needs continued PT services  PT Problem List Decreased strength;Decreased mobility;Decreased balance;Cardiopulmonary status limiting activity;Decreased skin integrity;Decreased activity tolerance;Decreased knowledge of precautions       PT Treatment Interventions Therapeutic activities;Gait training;Therapeutic exercise;Patient/family education;Balance training;Stair training;Functional mobility training    PT Goals (Current goals can be found in the Care Plan section)  Acute Rehab PT Goals Patient Stated Goal: to get better and go home PT Goal Formulation: With patient Time For Goal Achievement: 05/11/22 Potential to Achieve Goals: Good    Frequency Min 3X/week     Co-evaluation               AM-PAC PT "6 Clicks" Mobility  Outcome Measure Help needed turning from your back to your side while in a flat bed without using bedrails?: A Little Help needed moving from lying on your back to sitting on the side of a flat bed without using bedrails?: A Little Help needed moving to and from a bed to a chair (including a wheelchair)?: A Little Help needed standing up from a chair using your arms (e.g., wheelchair or bedside chair)?: A  Little Help needed to walk in hospital room?: A Little Help needed climbing 3-5 steps with a railing? : A Lot 6 Click Score: 17    End of Session   Activity Tolerance: Patient tolerated treatment well;Patient limited by fatigue Patient left: in bed;with call bell/phone within reach Nurse Communication: Mobility status PT Visit Diagnosis: Difficulty in walking, not elsewhere classified (R26.2);Muscle weakness (generalized) (M62.81)    Time: [id number] PT Time Calculation (min) (ACUTE ONLY): 20 min   Charges:   PT Evaluation $PT Eval Moderate Complexity: 1 Mod          Marisa Severin, PT, DPT Acute Rehabilitation Services Secure chat preferred Office [place] 04/27/2022, 10:16 AM

## 2022-04-27 NOTE — Progress Notes (Signed)
      [address]       [place] [address]             [phone]       POD # 8 CABG, thymectomy, LAA clip  Afebrile BP 139/65   Pulse 68   Temp 98.3 F (36.8 C) (Oral)   Resp (!) 25   Ht '5\' 6"'$  (1.676 m)   Wt 81.5 kg   SpO2 98%   BMI 29.00 kg/m    Intake/Output Summary (Last 24 hours) at 04/27/2022 1742 Last data filed at 04/27/2022 1200 Gross per 24 hour  Intake 680.07 ml  Output 550 ml  Net 130.07 ml   On Maxipime  Stable day, no new issues  Remo Lipps C. Roxan Hockey, MD [facility] [phone]

## 2022-04-27 NOTE — Progress Notes (Addendum)
[address]       [place] [address]             [phone]      8 Days Post-Op Procedure(s) (LRB): CORONARY ARTERY BYPASS GRAFTING (CABG) X3 USING LEFT INTERNAL MAMMARY ARTERY (LIMA) AND ENDOSCOPICALLY HARVESTED RIGHT GREATER SAPHENOUS VEIN AND CORONARY ENDARTERECTOMY (N/A) TRANSESOPHAGEAL ECHOCARDIOGRAM (N/A) THYMECTOMY (N/A) CLIPPING OF ATRIAL APPENDAGE USING ATRICURE 40MM ATRICLIP  Subjective:  Patient sitting up in chair.  Feeling better today.  He does still feel like needs to move his bowels, which he feels like would make a big difference.  Objective: Vital signs in last 24 hours: Temp:  [98.2 F (36.8 C)-101.8 F (38.8 C)] 98.2 F (36.8 C) (03/05 0600) Pulse Rate:  [71-98] 85 (03/05 0600) Cardiac Rhythm: Normal sinus rhythm (03/04 2000) Resp:  [11-26] 22 (03/05 0600) BP: (109-148)/(59-82) 139/74 (03/05 0600) SpO2:  [92 %-100 %] 95 % (03/05 0600) Weight:  [81.5 kg] 81.5 kg (03/05 0500)  Intake/Output from previous day: 03/04 0701 - 03/05 0700 In: 301.6 [P.O.:120; IV Piggyback:181.6] Out: 750 [Urine:750]  General appearance: alert, cooperative, and no distress Heart: regular rate and rhythm Lungs: diminished breath sounds bibasilar Abdomen: soft, non-tender; bowel sounds normal; no masses,  no organomegaly Extremities: edema minimal, much improve Wound: clean and dry  Lab Results: Recent Labs    04/26/22 0018 04/26/22 0839 04/27/22 0127  WBC 15.5*  --  18.7*  HGB 7.2* 9.2* 7.4*  HCT 21.7* 27.0* 23.7*  PLT 193  --  189   BMET:  Recent Labs    04/26/22 0018 04/26/22 0839 04/27/22 0127  NA 135 136 136  K 4.5 4.2 4.3  CL 100  --  102  CO2 26  --  26  GLUCOSE 136*  --  130*  BUN 23  --  25*  CREATININE 1.22  --  1.11  CALCIUM 8.1*  --  8.3*    PT/INR: No results for input(s): "LABPROT", "INR" in the last 72 hours. ABG    Component Value Date/Time   PHART 7.339 (L) 04/20/2022 0751   HCO3 25.6 04/26/2022 0839   TCO2  27 04/26/2022 0839   ACIDBASEDEF 9.0 (H) 04/20/2022 0751   O2SAT 32 04/26/2022 0839   CBG (last 3)  Recent Labs    04/24/22 1123 04/24/22 1604 04/25/22 0641  GLUCAP 117* 138* 133*    Assessment/Plan: S/P Procedure(s) (LRB): CORONARY ARTERY BYPASS GRAFTING (CABG) X3 USING LEFT INTERNAL MAMMARY ARTERY (LIMA) AND ENDOSCOPICALLY HARVESTED RIGHT GREATER SAPHENOUS VEIN AND CORONARY ENDARTERECTOMY (N/A) TRANSESOPHAGEAL ECHOCARDIOGRAM (N/A) THYMECTOMY (N/A) CLIPPING OF ATRIAL APPENDAGE USING ATRICURE 40MM ATRICLIP  CV-NSR, BP trending up- continue Lopressor will resume home Altace.. Echocardiogram with normal EF, no evidence of pericardial effusion Pulm- no acute issues, off oxygen, CXR with trace left pleural effusion.. continue IS Renal- creatinine stable, volume status stable GI- continued abdominal distention, no N/V.Marland Kitchen patient requesting suppository today ID- febrile yesterday, blood cultures positive x 4 for Enterobacter species... suspected urine as source, plan is for 7 days ABX Expected post operative blood loss anemia, Hgb stable Myasthenia Gravis- continue steroids Dispo- patient stable, looks and feels much better today, resume home Altace for better BP control, continue ABX for + Enterobacter in blood cultures.. Echo is normal.. Bowel regimen, will leave in ICU today if remains stable will transfer to 4E in AM   LOS: 12 days   Ellwood Handler, PA-C 04/27/2022   TCTS Progress Note: 8 Days Post-Op Procedure(s) (  LRB): CORONARY ARTERY BYPASS GRAFTING (CABG) X3 USING LEFT INTERNAL MAMMARY ARTERY (LIMA) AND ENDOSCOPICALLY HARVESTED RIGHT GREATER SAPHENOUS VEIN AND CORONARY ENDARTERECTOMY (N/A) TRANSESOPHAGEAL ECHOCARDIOGRAM (N/A) THYMECTOMY (N/A) CLIPPING OF ATRIAL APPENDAGE USING ATRICURE 40MM ATRICLIP  LOS: 12 days      Latest Ref Rng & Units 04/27/2022    1:27 AM 04/26/2022    8:39 AM 04/26/2022   12:18 AM  CBC  WBC 4.0 - 10.5 K/uL 18.7   15.5   Hemoglobin 13.0 - 17.0 g/dL 7.4   9.2  7.2   Hematocrit 39.0 - 52.0 % 23.7  27.0  21.7   Platelets 150 - 400 K/uL 189   193        Latest Ref Rng & Units 04/27/2022    1:27 AM 04/26/2022    8:39 AM 04/26/2022   12:18 AM  CMP  Glucose 70 - 99 mg/dL 130   136   BUN 8 - 23 mg/dL 25   23   Creatinine 0.61 - 1.24 mg/dL 1.11   1.22   Sodium 135 - 145 mmol/L 136  136  135   Potassium 3.5 - 5.1 mmol/L 4.3  4.2  4.5   Chloride 98 - 111 mmol/L 102   100   CO2 22 - 32 mmol/L 26   26   Calcium 8.9 - 10.3 mg/dL 8.3   8.1     ABG    Component Value Date/Time   PHART 7.339 (L) 04/20/2022 0751   PCO2ART 30.0 (L) 04/20/2022 0751   PO2ART 153 (H) 04/20/2022 0751   HCO3 25.6 04/26/2022 0839   TCO2 27 04/26/2022 0839   ACIDBASEDEF 9.0 (H) 04/20/2022 0751   O2SAT 32 04/26/2022 0839      WC up to 18 keep in icu today  Clinically appears perkier  Not urinary in source - not clear could be central line.   Keep on ABX  Docusate suppository x 1 hasn't had BM in 2 days.

## 2022-04-27 NOTE — Evaluation (Signed)
Occupational Therapy Evaluation Patient Details Name: Adrian Bell MRN: [id number] DOB: 1945/11/07 Today's Date: 04/27/2022   History of Present Illness Patient is a 77 y/o male who presents on 2/26 with NSTEMI, now s/p CABG x3 2/26. Found to have complicated UTI 3/6. PMH includes MG and MI.   Clinical Impression   Pt is typically independent in ADL and mobility, enjoys 4wheeling and camping and being outdoors. Today he is overall min guard for transfers with RW, educated on sternal precautions and some compensatory strategies for ADL. Overall mod A for UB and min A for LB ADL at this time. Pt with decreased activity tolerance and will benefit from further energy conservation education (and handout) next session. OT will follow acutely but do not anticipate need for post-acute OT at this time.       Recommendations for follow up therapy are one component of a multi-disciplinary discharge planning process, led by the attending physician.  Recommendations may be updated based on patient status, additional functional criteria and insurance authorization.   Follow Up Recommendations  No OT follow up     Assistance Recommended at Discharge Intermittent Supervision/Assistance  Patient can return home with the following A little help with bathing/dressing/bathroom;Assistance with cooking/housework;Assist for transportation;Help with stairs or ramp for entrance    Functional Status Assessment  Patient has had a recent decline in their functional status and demonstrates the ability to make significant improvements in function in a reasonable and predictable amount of time.  Equipment Recommendations  Tub/shower seat    Recommendations for Other Services       Precautions / Restrictions Precautions Precautions: Fall Restrictions Weight Bearing Restrictions: Yes RUE Weight Bearing: Non weight bearing LUE Weight Bearing: Non weight bearing Other Position/Activity Restrictions: sternal  precautions      Mobility Bed Mobility               General bed mobility comments: OOB in recliner at beginning and end of session    Transfers Overall transfer level: Needs assistance Equipment used: Rolling walker (2 wheels) Transfers: Sit to/from Stand Sit to Stand: Supervision           General transfer comment: Supervision for safety. Stood from chair x1 with cues for hands on knees.      Balance Overall balance assessment: Mild deficits observed, not formally tested                                         ADL either performed or assessed with clinical judgement   ADL Overall ADL's : Needs assistance/impaired Eating/Feeding: Set up   Grooming: Wash/dry hands;Wash/dry face;Set up;Sitting Grooming Details (indicate cue type and reason): decreased tolerance for standing activity Upper Body Bathing: Moderate assistance Upper Body Bathing Details (indicate cue type and reason): for back Lower Body Bathing: Moderate assistance   Upper Body Dressing : Moderate assistance;Sitting Upper Body Dressing Details (indicate cue type and reason): educated in clothing choices to maintain precautions Lower Body Dressing: Minimal assistance;Sit to/from stand Lower Body Dressing Details (indicate cue type and reason): able to complete figure 4 Toilet Transfer: Min guard;Ambulation;Rolling walker (2 wheels) Toilet Transfer Details (indicate cue type and reason): cues for sternal precautions with power up Toileting- Clothing Manipulation and Hygiene: Moderate assistance;Sit to/from stand       Functional mobility during ADLs: Min guard;Rolling walker (2 wheels) General ADL Comments: needs further education with sternal precautions  and compensatory strategies in ADL     Vision Ability to See in Adequate Light: 0 Adequate Patient Visual Report: No change from baseline Vision Assessment?: No apparent visual deficits     Perception     Praxis       Pertinent Vitals/Pain Pain Assessment Pain Assessment: No/denies pain     Hand Dominance Right   Extremity/Trunk Assessment Upper Extremity Assessment Upper Extremity Assessment: Overall WFL for tasks assessed (limited ROM to maintain precautions)   Lower Extremity Assessment Lower Extremity Assessment: Defer to PT evaluation   Cervical / Trunk Assessment Cervical / Trunk Assessment: Normal   Communication Communication Communication: No difficulties   Cognition Arousal/Alertness: Awake/alert Behavior During Therapy: WFL for tasks assessed/performed Overall Cognitive Status: Within Functional Limits for tasks assessed                                       General Comments  VSS ON RA    Exercises     Shoulder Instructions      Home Living Family/patient expects to be discharged to:: Private residence Living Arrangements: Spouse/significant other Available Help at Discharge: Family;Available PRN/intermittently Type of Home: House Home Access: Stairs to enter [employer] of Steps: a couple   Home Layout: One level     Bathroom Shower/Tub: Walk-in shower;Tub only   Bathroom Toilet: Handicapped height     Home Equipment: None   Additional Comments: Has a golf cart and 4 wheeler, loves to camp      Prior Functioning/Environment Prior Level of Function : Independent/Modified Independent;Driving             Mobility Comments: independent ADLs Comments: independent; wife does IADL        OT Problem List: Decreased range of motion;Decreased activity tolerance;Impaired balance (sitting and/or standing);Decreased knowledge of use of DME or AE;Decreased knowledge of precautions;Cardiopulmonary status limiting activity;Impaired UE functional use      OT Treatment/Interventions: Self-care/ADL training;Energy conservation;DME and/or AE instruction;Therapeutic activities;Patient/family education;Balance training    OT Goals(Current  goals can be found in the care plan section) Acute Rehab OT Goals Patient Stated Goal: get back to camping OT Goal Formulation: With patient Time For Goal Achievement: 05/11/22 Potential to Achieve Goals: Good ADL Goals Pt Will Perform Grooming: with modified independence;standing Pt Will Perform Upper Body Dressing: with set-up;sitting Pt Will Perform Lower Body Dressing: with supervision;sit to/from stand Pt Will Transfer to Toilet: with modified independence;ambulating Pt Will Perform Toileting - Clothing Manipulation and hygiene: with supervision;sit to/from stand Additional ADL Goal #1: Pt will maintain sternal precautions during ADL routine with no cues (including transfers necessary for ADL)  OT Frequency: Min 2X/week    Co-evaluation              AM-PAC OT "6 Clicks" Daily Activity     Outcome Measure Help from another person eating meals?: None Help from another person taking care of personal grooming?: A Little Help from another person toileting, which includes using toliet, bedpan, or urinal?: A Little Help from another person bathing (including washing, rinsing, drying)?: A Little Help from another person to put on and taking off regular upper body clothing?: A Lot Help from another person to put on and taking off regular lower body clothing?: A Little 6 Click Score: 18   End of Session Equipment Utilized During Treatment: Rolling walker (2 wheels) Nurse Communication: Mobility status;Precautions;Weight bearing status  Activity Tolerance:  Patient tolerated treatment well Patient left: in chair;with call bell/phone within reach  OT Visit Diagnosis: Other abnormalities of gait and mobility (R26.89);Muscle weakness (generalized) (M62.81)                Time: 0950-1010 OT Time Calculation (min): 20 min Charges:  OT General Charges $OT Visit: 1 Visit OT Evaluation $OT Eval Moderate Complexity: [place] OTR/L Acute Rehabilitation Services Office:  [place] 04/27/2022, 1:20 PM

## 2022-04-28 DIAGNOSIS — I214 Non-ST elevation (NSTEMI) myocardial infarction: Secondary | ICD-10-CM | POA: Diagnosis not present

## 2022-04-28 LAB — CBC
HCT: 22.5 % — ABNORMAL LOW (ref 39.0–52.0)
Hemoglobin: 7.3 g/dL — ABNORMAL LOW (ref 13.0–17.0)
MCH: 29.3 pg (ref 26.0–34.0)
MCHC: 32.4 g/dL (ref 30.0–36.0)
MCV: 90.4 fL (ref 80.0–100.0)
Platelets: 211 10*3/uL (ref 150–400)
RBC: 2.49 MIL/uL — ABNORMAL LOW (ref 4.22–5.81)
RDW: 14.7 % (ref 11.5–15.5)
WBC: 19.7 10*3/uL — ABNORMAL HIGH (ref 4.0–10.5)
nRBC: 0 % (ref 0.0–0.2)

## 2022-04-28 LAB — BASIC METABOLIC PANEL
Anion gap: 9 (ref 5–15)
BUN: 24 mg/dL — ABNORMAL HIGH (ref 8–23)
CO2: 25 mmol/L (ref 22–32)
Calcium: 8.4 mg/dL — ABNORMAL LOW (ref 8.9–10.3)
Chloride: 100 mmol/L (ref 98–111)
Creatinine, Ser: 1 mg/dL (ref 0.61–1.24)
GFR, Estimated: >60 mL/min (ref >60)
Glucose, Bld: 162 mg/dL — ABNORMAL HIGH (ref 70–99)
Potassium: 4.1 mmol/L (ref 3.5–5.1)
Sodium: 134 mmol/L — ABNORMAL LOW (ref 135–145)

## 2022-04-28 MED ORDER — SODIUM CHLORIDE 0.9 % IV SOLN
1.0000 g | INTRAVENOUS | Status: DC
  Administered 2022-04-28 – 2022-04-29 (×2): 1000 mg via INTRAVENOUS
  Filled 2022-04-28 (×4): qty 1

## 2022-04-28 NOTE — Progress Notes (Signed)
NIF/VC performed with good effort. NIF gauge only goes to -40 cmH2O & he easily reached that. VC 1.9L

## 2022-04-28 NOTE — Progress Notes (Addendum)
NAME:  Adrian Bell, MRN:  [id number], DOB:  05-04-1945, LOS: 61 ADMISSION DATE:  04/15/2022, CONSULTATION DATE:  2/26 REFERRING MD:  Tenny Craw, CHIEF COMPLAINT:  post-CABG   History of Present Illness:  Mr. Dahlen is a 77 y/o gentleman with a history of CAD s/p previous PCI who presented to [facility] from [place] with an NSTEMI. He presented with left-sided chest pain that was worse than his typical intermittent left chest pain, requiring 3 SL NTG to relieve his pain. He recently has endorsed exertional dyspnea as well. He underwent LHC upon transfer to [facility] and was found to have severe 3-vessel disease. He was evaluated by Cardiothoracic surgery for consideration of bypass surgery. At baseline he is active and independent. He has been managed with his baseline cardiac medications and heparin until today. EF is preserved. Cardiac risk factors include previous use of smokeless tobacco, HTN, HLD, age, gender. He underwent CABG x 3, LCX end-arterectomy, LAA clipping, and empiric thymectomy given history of MG.  Post-operatively he is on epinephrine. Last dose of paralytics was noon today; before leaving the OR he had only 1/4 twitches. Total rocuronium dose today '130mg'$ .   He has a history of MG which was diagnosed in 2018. His symptoms include diplopia, dysarthria, dysphagia. His maintenance regimen for MG is porednisone '10mg'$  daily with 1 previous flare since diagnosis. He has had an adverse reaction to IVIG in the past and was discontinued from this maintenance regimen in 12/2021.   Pertinent  Medical History  CAD HTN HLD MG on prednisone '10mg'$  daily nephrolithiasis  Significant Hospital Events: Including procedures, antibiotic start and stop dates in addition to other pertinent events   2/23 admission, LHC 2/26 CABG x 3, Lcx end-arterectomy, LAA clip, thymectomy 3/2 possible transfer out of ICU  3/4 4/4 cultures positive for Enterobacterales without resistance. CT shows no definitive source of  infection.  Interim History / Subjective:  Patient feels better than yesterday. Eager to get out of ICU  Objective   Blood pressure 113/68, pulse 76, temperature 98.1 F (36.7 C), temperature source Oral, resp. rate (!) 23, height '5\' 6"'$  (1.676 m), weight 82.3 kg, SpO2 95 %.        Intake/Output Summary (Last 24 hours) at 04/28/2022 0855 Last data filed at 04/27/2022 2200 Gross per 24 hour  Intake 220 ml  Output 750 ml  Net -530 ml    Filed Weights   04/26/22 0500 04/27/22 0500 04/28/22 0316  Weight: 81.6 kg 81.5 kg 82.3 kg    Examination: General: Elderly gentleman in bed.  HEENT: NCAT, tracking appropriately Neuro: Alert oriented x 4 CV: Regular rate rhythm S1-S2, no rub.  PULM: No crackles at left base. Pulling close to 1/L with IS GI: Soft nontender nondistended Extremities: No significant edema  Ancillary tests personally reviewed:   CT shows small stones in GB and kidney   Creatinine 1.11 WBC 19.7 HB 7.4 Assessment & Plan:   3v CAD with NSTEMI s/p CABG x3 + end arterectomy Lcx, LAA clipping, thymectomy Hafnia alvei bacteremia without sepsis.   Post-operative atelectasis.  MG, without acute exacerbation  Chronic steroid use  Hyperglycemia Expected post op ABLA  GERD  Plan: - Encourage ambulation and IS use.  - No diuresis today.  - Complete 10 days of antibiotics for GNR bacteremia without source per literature (urine still most likely, followed by gut translocation from constipation, minor trauma from PM wires or chest tube insertion).  - Follow WBC, if leukocytosis fails to improve, would re-examine  issue of renal stones as possible source.  Consider outpatient colonoscopy to rule out GI lesion as source of translocation.  - ID consultation given unusual organism.  - Continue current beta-blocker dose, avoid increase due to MG.  - Continue current dose of prednisone for MG. Following MG clinically.   PCCM will follow while in ICU.   Best Practice (right  click and "Reselect all SmartList Selections" daily)   Diet/type: Regular consistency (see orders) DVT prophylaxis: LMWH GI prophylaxis: H2B and PPI Lines: N/A Foley:  N/A Code Status:  full code Last date of multidisciplinary goals of care discussion [ per primary ]  Kipp Brood, MD [facility] [place] ICU Physician [place]  Pager: [phone] Or Epic Secure Chat After hours: [phone].  04/28/2022, 8:55 AM

## 2022-04-28 NOTE — Progress Notes (Signed)
TCTS Progress Note: 9 Days Post-Op Procedure(s) (LRB): CORONARY ARTERY BYPASS GRAFTING (CABG) X3 USING LEFT INTERNAL MAMMARY ARTERY (LIMA) AND ENDOSCOPICALLY HARVESTED RIGHT GREATER SAPHENOUS VEIN AND CORONARY ENDARTERECTOMY (N/A) TRANSESOPHAGEAL ECHOCARDIOGRAM (N/A) THYMECTOMY (N/A) CLIPPING OF ATRIAL APPENDAGE USING ATRICURE 40MM ATRICLIP  LOS: 13 days      Latest Ref Rng & Units 04/27/2022    1:27 AM 04/26/2022    8:39 AM 04/26/2022   12:18 AM  CBC  WBC 4.0 - 10.5 K/uL 18.7   15.5   Hemoglobin 13.0 - 17.0 g/dL 7.4  9.2  7.2   Hematocrit 39.0 - 52.0 % 23.7  27.0  21.7   Platelets 150 - 400 K/uL 189   193        Latest Ref Rng & Units 04/27/2022    1:27 AM 04/26/2022    8:39 AM 04/26/2022   12:18 AM  CMP  Glucose 70 - 99 mg/dL 130   136   BUN 8 - 23 mg/dL 25   23   Creatinine 0.61 - 1.24 mg/dL 1.11   1.22   Sodium 135 - 145 mmol/L 136  136  135   Potassium 3.5 - 5.1 mmol/L 4.3  4.2  4.5   Chloride 98 - 111 mmol/L 102   100   CO2 22 - 32 mmol/L 26   26   Calcium 8.9 - 10.3 mg/dL 8.3   8.1     ABG    Component Value Date/Time   PHART 7.339 (L) 04/20/2022 0751   PCO2ART 30.0 (L) 04/20/2022 0751   PO2ART 153 (H) 04/20/2022 0751   HCO3 25.6 04/26/2022 0839   TCO2 27 04/26/2022 0839   ACIDBASEDEF 9.0 (H) 04/20/2022 0751   O2SAT 32 04/26/2022 0839       Sensitivities pending  On Abx   Plan is floor today  Need a CBC/chem daily  today to track WC  On ASA/plavix.  Dispo - likely home Thursday or Friday

## 2022-04-28 NOTE — Consult Note (Addendum)
[place] for Infectious Disease    Date of Admission:  04/15/2022   Total days of inpatient antibiotics 5        Reason for Consult: GN bacteremia    Principal Problem:   NSTEMI (non-ST elevated myocardial infarction) ([place]) Active Problems:   S/P thymectomy   S/P CABG x 3   Assessment: 77 YM admitted for CABG x3 developed fevers POD6(3/3) found to have hafnia bacteremia 2/2 unclear source.  #Hafnia alvei bacteremia 2/2 unclear source #Myasthenia gravis(MG) on prednisone #SP CABG x3 using LIMA, right greater saphenous vein and coronary endarterectomy, Lcx, thymectomy on 2/26 #Fever/Leukocytosis 2/2 #1   -UA benign( on 3/3-negative nitrite/leukocytes estrace) piror to starting antibiotics. He has not GI complaints.  I examined th pt's post surgical wounds and noted one prior chest tube site slightly more erythematous than the rest.  Although he has no respiratory complaints, given his immunosuppression I think the chest tube site may have been port of entry fro bacteremia. Hafnia bacteremia 2/2 PNA noted in literature. Prior R IJ site is not concerning for infection, fem salvage wound healing well.  Hafnia alvei is generally sens to cipro, but given MG will avoid as it may exacerbate MG symptoms  Recommendations:  -D/C cefepime(called and confirmed with lab sens for cefepime are not on MIC card) -Start ertapenem, pending sens hopefully we have a PO option. Plan on 2 weeks of abx for bacteremia 2/2 unclear source   I have personally spent 95 minutes involved in face-to-face and non-face-to-face activities for this patient on the day of the visit. Professional time spent includes the following activities: Preparing to see the patient (review of tests), Obtaining and/or reviewing separately obtained history (admission/discharge record), Performing a medically appropriate examination and/or evaluation , Ordering medications/tests/procedures, referring and communicating  with other health care professionals, Documenting clinical information in the EMR, Independently interpreting results (not separately reported), Communicating results to the patient/family/caregiver, Counseling and educating the patient/family/caregiver and Care coordination (not separately reported).   Microbiology:   Antibiotics: Piptazo 3/3-3/4 Cefepime 3/4-3/5 Ctx 3/4  Cultures: Blood 04/25/22 2/2 hafnia alvei   HPI: Adrian Bell is a 77 y.o. male with myasthenia gravis on prednisone, hypertension, previous smokeless tobacco use, hyperlipidemia, nephrolithiasis, CAD status post previous PCI admitted for NSTEMI.  Presenting with chest pain taken to the OR for CABG x 3 on 2/26, thymectomy, LAA clipping.  Postop course complicated by fever found to have Hafnia alvei bacteremia transition to cefepime.  ID engaged.  Patient denies any urinary symptoms.  UA without nitrites/leukocytes.  Denies any shortness of breath, chest pain. Myasthenia gravis was diagnosed in 2018.  He is maintained on prednisone 10 mg.   Review of Systems: Review of Systems  All other systems reviewed and are negative.   Past Medical History:  Diagnosis Date   Dysphagia    Heart attack ([place]) 2020   Hyperlipidemia    Kidney stones    Myasthenia gravis ([place])    STEMI (ST elevation myocardial infarction) ([place])     Social History   Tobacco Use   Smoking status: Never   Smokeless tobacco: Former    Types: Chew    Quit date: 2009  Vaping Use   Vaping Use: Never used  Substance Use Topics   Alcohol use: Yes    Comment: occ    Drug use: No    Family History  Problem Relation Age of Onset   Heart attack Mother  Heart disease Mother    Heart attack Father    Stroke Brother    Diabetes Sister    Stroke Sister    Esophageal cancer Brother    Deep vein thrombosis Brother    Scheduled Meds:  aspirin EC  81 mg Oral Daily   bisacodyl  10 mg Oral Daily   Chlorhexidine Gluconate Cloth  6 each Topical Daily    clopidogrel  75 mg Oral Daily   docusate sodium  200 mg Oral Daily   enoxaparin (LOVENOX) injection  40 mg Subcutaneous QHS   Fe Fum-Vit C-Vit B12-FA  1 capsule Oral QPC breakfast   metoprolol tartrate  25 mg Oral BID   pantoprazole  40 mg Oral Daily   polyethylene glycol  17 g Oral Daily   predniSONE  10 mg Oral QAC breakfast   ramipril  5 mg Oral Daily   senna  1 tablet Oral Daily   sodium chloride flush  3 mL Intravenous Q12H   Continuous Infusions:  ceFEPime (MAXIPIME) IV 2 g (04/28/22 0529)   PRN Meds:.acetaminophen, ondansetron (ZOFRAN) IV, mouth rinse, oxyCODONE, sodium chloride flush, traMADol Allergies  Allergen Reactions   Zetia [Ezetimibe] Other (See Comments)    Weakness, shortness of breath   Demerol [Meperidine Hcl]    Statins Other (See Comments)    myalgias    OBJECTIVE: Blood pressure 113/68, pulse 76, temperature 98.1 F (36.7 C), temperature source Oral, resp. rate (!) 23, height '5\' 6"'$  (1.676 m), weight 82.3 kg, SpO2 95 %.  Physical Exam Constitutional:      General: He is not in acute distress.    Appearance: He is normal weight. He is not toxic-appearing.  HENT:     Head: Normocephalic and atraumatic.     Right Ear: External ear normal.     Left Ear: External ear normal.     Nose: No congestion or rhinorrhea.     Mouth/Throat:     Mouth: Mucous membranes are moist.     Pharynx: Oropharynx is clear.  Eyes:     Extraocular Movements: Extraocular movements intact.     Conjunctiva/sclera: Conjunctivae normal.     Pupils: Pupils are equal, round, and reactive to light.  Cardiovascular:     Rate and Rhythm: Normal rate and regular rhythm.     Heart sounds: No murmur heard.    No friction rub. No gallop.  Pulmonary:     Effort: Pulmonary effort is normal.     Breath sounds: Normal breath sounds.  Abdominal:     General: Abdomen is flat. Bowel sounds are normal.     Palpations: Abdomen is soft.  Musculoskeletal:        General: No swelling.  Normal range of motion.     Cervical back: Normal range of motion and neck supple.  Skin:    General: Skin is warm and dry.  Neurological:     General: No focal deficit present.     Mental Status: He is oriented to person, place, and time.  Psychiatric:        Mood and Affect: Mood normal.          Lab Results Lab Results  Component Value Date   WBC 19.7 (H) 04/28/2022   HGB 7.3 (L) 04/28/2022   HCT 22.5 (L) 04/28/2022   MCV 90.4 04/28/2022   PLT 211 04/28/2022    Lab Results  Component Value Date   CREATININE 1.00 04/28/2022   BUN 24 (H) 04/28/2022   NA  134 (L) 04/28/2022   K 4.1 04/28/2022   CL 100 04/28/2022   CO2 25 04/28/2022    Lab Results  Component Value Date   ALT 14 04/18/2022   AST 27 04/18/2022   ALKPHOS 54 04/18/2022   BILITOT 0.7 04/18/2022       Laurice Record, [place] for Infectious Disease [facility] 04/28/2022, 10:23 AM

## 2022-04-28 NOTE — Progress Notes (Signed)
Patient arrived at the unit,CHG bath given,CCMD notified,vitals checked, pt oriented to the unit,no complaints of pain at this time

## 2022-04-28 NOTE — Progress Notes (Signed)
CARDIAC REHAB PHASE I   PRE:  Rate/Rhythm: 74 SR    BP: sitting 104/77    SpO2: 96 RA  MODE:  Ambulation: 240 ft   POST:  Rate/Rhythm: 96 SR    BP: sitting 139/69     SpO2: 96 RA  Pt stood with rocking. Walked with RW, fairly steady, gait belt contact guard. Pt fatigued with distance, c/o leg weakness. To recliner, encouraged another walk and IS. Pt would benefit from RW at home. [id number]   Yves Dill BS, ACSM-CEP 04/28/2022 3:24 PM

## 2022-04-29 DIAGNOSIS — I214 Non-ST elevation (NSTEMI) myocardial infarction: Secondary | ICD-10-CM | POA: Diagnosis not present

## 2022-04-29 LAB — BASIC METABOLIC PANEL
Anion gap: 8 (ref 5–15)
BUN: 20 mg/dL (ref 8–23)
CO2: 24 mmol/L (ref 22–32)
Calcium: 8.4 mg/dL — ABNORMAL LOW (ref 8.9–10.3)
Chloride: 102 mmol/L (ref 98–111)
Creatinine, Ser: 0.93 mg/dL (ref 0.61–1.24)
GFR, Estimated: >60 mL/min (ref >60)
Glucose, Bld: 118 mg/dL — ABNORMAL HIGH (ref 70–99)
Potassium: 4.8 mmol/L (ref 3.5–5.1)
Sodium: 134 mmol/L — ABNORMAL LOW (ref 135–145)

## 2022-04-29 LAB — CULTURE, BLOOD (ROUTINE X 2)
Special Requests: ADEQUATE
Special Requests: ADEQUATE

## 2022-04-29 LAB — CBC
HCT: 24.7 % — ABNORMAL LOW (ref 39.0–52.0)
Hemoglobin: 8.1 g/dL — ABNORMAL LOW (ref 13.0–17.0)
MCH: 29.5 pg (ref 26.0–34.0)
MCHC: 32.8 g/dL (ref 30.0–36.0)
MCV: 89.8 fL (ref 80.0–100.0)
Platelets: 230 10*3/uL (ref 150–400)
RBC: 2.75 MIL/uL — ABNORMAL LOW (ref 4.22–5.81)
RDW: 14.6 % (ref 11.5–15.5)
WBC: 16.4 10*3/uL — ABNORMAL HIGH (ref 4.0–10.5)
nRBC: 0 % (ref 0.0–0.2)

## 2022-04-29 LAB — GLUCOSE, CAPILLARY: Glucose-Capillary: 200 mg/dL — ABNORMAL HIGH (ref 70–99)

## 2022-04-29 NOTE — Progress Notes (Signed)
Occupational Therapy Treatment Patient Details Name: Adrian Bell MRN: [id number] DOB: 07-31-1945 Today's Date: 04/29/2022   History of present illness Patient is a 77 y/o male who presents on 2/26 with NSTEMI, now s/p CABG x3 2/26. Found to have complicated UTI 3/6. PMH includes MG and MI.   OT comments  Pt continuing to progress towards patient focused goals. Pt completing room level ambulation with Supervision and was able to return to chair without the use of RW. Pt stated he would benefit from rollator d/t experiencing symptoms of BLE and back weakness, OT in agreeance with patient suggested AD. Pt demonstrated understanding and retention of sternal precautions during functional ambulation and tasks. Discussed energy conservation with Pt, mild SOB noted during session but quickly ceased following pursed lip breathing. Pt to continue with skilled acute OT services to address deficits listed above and increase functional independence. No follow-up OT recommended at this time.   Recommendations for follow up therapy are one component of a multi-disciplinary discharge planning process, led by the attending physician.  Recommendations may be updated based on patient status, additional functional criteria and insurance authorization.    Follow Up Recommendations  No OT follow up     Assistance Recommended at Discharge Intermittent Supervision/Assistance  Patient can return home with the following  Help with stairs or ramp for entrance;Assist for transportation;A little help with bathing/dressing/bathroom;Assistance with cooking/housework   Equipment Recommendations  Other (comment) (Pt reports they would benefit from rollator d/t BLE weakness. OT in agreeance)    Recommendations for Other Services      Precautions / Restrictions Precautions Precautions: Sternal Restrictions Weight Bearing Restrictions: Yes RUE Weight Bearing: Non weight bearing (Sternal precaution) LUE Weight Bearing: Non  weight bearing Other Position/Activity Restrictions: BUE limited in movement and weight bearing d/t sternal precautions       Mobility Bed Mobility                    Transfers Overall transfer level: Needs assistance Equipment used: Rolling walker (2 wheels) Transfers: Sit to/from Stand Sit to Stand: Supervision           General transfer comment: chair t/f SBA no AD     Balance Overall balance assessment: Needs assistance Sitting-balance support: Bilateral upper extremity supported, Feet supported Sitting balance-Leahy Scale: Normal Sitting balance - Comments: Supported sitting in chair   Standing balance support: Bilateral upper extremity supported, During functional activity Standing balance-Leahy Scale: Good Standing balance comment: Supervision                           ADL either performed or assessed with clinical judgement   ADL Overall ADL's : Needs assistance/impaired     Grooming: Supervision/safety;Standing               Lower Body Dressing: Sitting/lateral leans;Supervision/safety Lower Body Dressing Details (indicate cue type and reason): adheres to sternal precautions donning bilat socks in figure four             Functional mobility during ADLs: Supervision/safety;Rolling walker (2 wheels)      Extremity/Trunk Assessment Upper Extremity Assessment Upper Extremity Assessment: RUE deficits/detail;LUE deficits/detail RUE Deficits / Details: Functional use limitations due to spinal precautions LUE Deficits / Details: Functional use limitations due to spinal precautions            Vision   Vision Assessment?: No apparent visual deficits   Perception Perception Perception: Not tested   Praxis Praxis  Praxis: Intact    Cognition Arousal/Alertness: Awake/alert Behavior During Therapy: WFL for tasks assessed/performed Overall Cognitive Status: Within Functional Limits for tasks assessed                                           Exercises      Shoulder Instructions       General Comments Incision clean,dry, and healing well. Pt reported general BLE and back weakness    Pertinent Vitals/ Pain       Pain Assessment Pain Assessment: No/denies pain  Home Living                                          Prior Functioning/Environment              Frequency  Min 2X/week        Progress Toward Goals  OT Goals(current goals can now be found in the care plan section)  Progress towards OT goals: Progressing toward goals  Acute Rehab OT Goals Patient Stated Goal: Return home OT Goal Formulation: With patient Time For Goal Achievement: 05/11/22 Potential to Achieve Goals: Good  Plan Discharge plan remains appropriate    Co-evaluation                 AM-PAC OT "6 Clicks" Daily Activity     Outcome Measure   Help from another person eating meals?: None Help from another person taking care of personal grooming?: None Help from another person toileting, which includes using toliet, bedpan, or urinal?: A Little Help from another person bathing (including washing, rinsing, drying)?: A Little Help from another person to put on and taking off regular upper body clothing?: A Little Help from another person to put on and taking off regular lower body clothing?: A Little 6 Click Score: 20    End of Session Equipment Utilized During Treatment: Rolling walker (2 wheels)  OT Visit Diagnosis: Other (comment);Muscle weakness (generalized) (M62.81) (Post-op with precautions)   Activity Tolerance Patient tolerated treatment well   Patient Left in chair   Nurse Communication Mobility status;Precautions (Sternal Precautions)        Time: [id number] OT Time Calculation (min): 16 min  Charges: OT General Charges $OT Visit: 1 Visit OT Treatments $Self Care/Home Management : 8-22 mins  04/29/2022  AB, OTR/L  Acute Rehabilitation  Services  Office: [phone]   Cori Razor 04/29/2022, 4:50 PM

## 2022-04-29 NOTE — Progress Notes (Signed)
[place] for Infectious Disease  Date of Admission:  04/15/2022   Total days of inpatient antibiotics 6  Principal Problem:   NSTEMI (non-ST elevated myocardial infarction) ([place]) Active Problems:   S/P thymectomy   S/P CABG x 3          Assessment: 76 YM admitted for CABG x3 developed fevers POD6(3/3) found to have hafnia bacteremia 2/2 unclear source.   #Hafnia alvei bacteremia 2/2 unclear source #Myasthenia gravis(MG) on prednisone #SP CABG x3 using LIMA, right greater saphenous vein and coronary endarterectomy, Lcx, thymectomy on 2/26 #Leukocytosis trending down #Fever-resolved     -UA benign( on 3/3-negative nitrite/leukocytes estrace) piror to starting antibiotics. He has not GI complaints.  I examined th pt's post surgical wounds and noted one prior chest tube site slightly more erythematous than the rest.  Although he has no respiratory complaints, given his immunosuppression I think the chest tube site may have been port of entry fro bacteremia. Hafnia bacteremia 2/2 PNA noted in literature. Prior R IJ site is not concerning for infection, fem salvage wound healing well.  Hafnia alvei is generally sens to cipro, but given MG will avoid as it may exacerbate MG symptoms   Recommendations:  -Continue ertapenem. Plan to transition to bactrim tomorrow.    Microbiology:   Antibiotics: Cefepime 3/4-5 Ceftriaxone 3/6 Pip-tazo 3/3-4 Cultures: Blood 04/25/22 2/2 hafnia alvei  SUBJECTIVE: Sitting in chair with breakfast.  Interval: Afebrile overnight. Wbc 16.4k  Review of Systems: Review of Systems  All other systems reviewed and are negative.    Scheduled Meds:  aspirin EC  81 mg Oral Daily   bisacodyl  10 mg Oral Daily   Chlorhexidine Gluconate Cloth  6 each Topical Daily   clopidogrel  75 mg Oral Daily   docusate sodium  200 mg Oral Daily   enoxaparin (LOVENOX) injection  40 mg Subcutaneous QHS   Fe Fum-Vit C-Vit B12-FA  1 capsule Oral QPC  breakfast   metoprolol tartrate  25 mg Oral BID   pantoprazole  40 mg Oral Daily   predniSONE  10 mg Oral QAC breakfast   ramipril  5 mg Oral Daily   sodium chloride flush  3 mL Intravenous Q12H   Continuous Infusions:  ertapenem Stopped (04/28/22 1847)   PRN Meds:.acetaminophen, ondansetron (ZOFRAN) IV, mouth rinse, oxyCODONE, sodium chloride flush, traMADol Allergies  Allergen Reactions   Zetia [Ezetimibe] Other (See Comments)    Weakness, shortness of breath   Demerol [Meperidine Hcl]    Statins Other (See Comments)    myalgias    OBJECTIVE: Vitals:   04/29/22 0524 04/29/22 0753 04/29/22 0929 04/29/22 1200  BP:  134/67 134/68 118/64  Pulse:  85 91 74  Resp:  '20 17 20  '$ Temp:  98.7 F (37.1 C)  98 F (36.7 C)  TempSrc:  Oral  Oral  SpO2:  95%  98%  Weight: 82.4 kg     Height:       Body mass index is 29.31 kg/m.  Physical Exam Constitutional:      General: He is not in acute distress.    Appearance: He is normal weight. He is not toxic-appearing.  HENT:     Head: Normocephalic and atraumatic.     Right Ear: External ear normal.     Left Ear: External ear normal.     Nose: No congestion or rhinorrhea.     Mouth/Throat:     Mouth: Mucous membranes are moist.  Pharynx: Oropharynx is clear.  Eyes:     Extraocular Movements: Extraocular movements intact.     Conjunctiva/sclera: Conjunctivae normal.     Pupils: Pupils are equal, round, and reactive to light.  Cardiovascular:     Rate and Rhythm: Normal rate and regular rhythm.     Heart sounds: No murmur heard.    No friction rub. No gallop.     Comments: Post op wounds Pulmonary:     Effort: Pulmonary effort is normal.     Breath sounds: Normal breath sounds.  Abdominal:     General: Abdomen is flat. Bowel sounds are normal.     Palpations: Abdomen is soft.  Musculoskeletal:        General: No swelling. Normal range of motion.     Cervical back: Normal range of motion and neck supple.  Skin:     General: Skin is warm and dry.  Neurological:     General: No focal deficit present.     Mental Status: He is oriented to person, place, and time.  Psychiatric:        Mood and Affect: Mood normal.       Lab Results Lab Results  Component Value Date   WBC 16.4 (H) 04/29/2022   HGB 8.1 (L) 04/29/2022   HCT 24.7 (L) 04/29/2022   MCV 89.8 04/29/2022   PLT 230 04/29/2022    Lab Results  Component Value Date   CREATININE 0.93 04/29/2022   BUN 20 04/29/2022   NA 134 (L) 04/29/2022   K 4.8 04/29/2022   CL 102 04/29/2022   CO2 24 04/29/2022    Lab Results  Component Value Date   ALT 14 04/18/2022   AST 27 04/18/2022   ALKPHOS 54 04/18/2022   BILITOT 0.7 04/18/2022        Laurice Record, [place] [facility] Infectious Disease [facility] 04/29/2022, 2:32 PM

## 2022-04-29 NOTE — Progress Notes (Addendum)
      [address]       [place] [address]             [phone]      10 Days Post-Op Procedure(s) (LRB): CORONARY ARTERY BYPASS GRAFTING (CABG) X3 USING LEFT INTERNAL MAMMARY ARTERY (LIMA) AND ENDOSCOPICALLY HARVESTED RIGHT GREATER SAPHENOUS VEIN AND CORONARY ENDARTERECTOMY (N/A) TRANSESOPHAGEAL ECHOCARDIOGRAM (N/A) THYMECTOMY (N/A) CLIPPING OF ATRIAL APPENDAGE USING ATRICURE 40MM ATRICLIP  Subjective:  Patient doing okay.  He continues to have some upset stomach episodes.  Denies N/V.  No diarrhea.  Objective: Vital signs in last 24 hours: Temp:  [98.1 F (36.7 C)-98.9 F (37.2 C)] 98.9 F (37.2 C) (03/07 0345) Pulse Rate:  [64-76] 72 (03/07 0345) Cardiac Rhythm: Sinus bradycardia;Bundle branch block (03/06 1900) Resp:  [19-23] 19 (03/07 0345) BP: (104-146)/(57-76) 132/68 (03/07 0345) SpO2:  [95 %-99 %] 97 % (03/07 0345) Weight:  [82.4 kg] 82.4 kg (03/07 0524)  Intake/Output from previous day: 03/06 0701 - 03/07 0700 In: 580 [P.O.:480; IV Piggyback:100] Out: 400 [Urine:400]  General appearance: alert, cooperative, and no distress Heart: regular rate and rhythm Lungs: clear to auscultation bilaterally Abdomen: soft, non-tender; bowel sounds normal; no masses,  no organomegaly Extremities: edema trace Wound: clean and dry  Lab Results: Recent Labs    04/28/22 0724 04/29/22 0147  WBC 19.7* 16.4*  HGB 7.3* 8.1*  HCT 22.5* 24.7*  PLT 211 230   BMET:  Recent Labs    04/28/22 0724 04/29/22 0147  NA 134* 134*  K 4.1 4.8  CL 100 102  CO2 25 24  GLUCOSE 162* 118*  BUN 24* 20  CREATININE 1.00 0.93  CALCIUM 8.4* 8.4*    PT/INR: No results for input(s): "LABPROT", "INR" in the last 72 hours. ABG    Component Value Date/Time   PHART 7.339 (L) 04/20/2022 0751   HCO3 25.6 04/26/2022 0839   TCO2 27 04/26/2022 0839   ACIDBASEDEF 9.0 (H) 04/20/2022 0751   O2SAT 32 04/26/2022 0839   CBG (last 3)  No results for input(s): "GLUCAP" in the  last 72 hours.  Assessment/Plan: S/P Procedure(s) (LRB): CORONARY ARTERY BYPASS GRAFTING (CABG) X3 USING LEFT INTERNAL MAMMARY ARTERY (LIMA) AND ENDOSCOPICALLY HARVESTED RIGHT GREATER SAPHENOUS VEIN AND CORONARY ENDARTERECTOMY (N/A) TRANSESOPHAGEAL ECHOCARDIOGRAM (N/A) THYMECTOMY (N/A) CLIPPING OF ATRIAL APPENDAGE USING ATRICURE 40MM ATRICLIP  CV- NSR, BP stable- continue Lopressor, Altace Pulm- off oxygen, no acute issues, continue IS Renal-weight is trending up, could be variation in scale that patient changed units.. not currently on Lasix, potassium.. some edema on exam will discuss with Dr. Tenny Craw ID- afebrile, leukocytosis improved.. patients blood cultures showing Hafnia Alvei--- ID is following, ABX regimen has been adjusted.. will await sensitivities-- if no oral agents, patient will require IV ABX course 2 weeks total of treatment Myasthenia Gravis- continue baseline steroids Dispo- patient stable, needs to remain hospitalized until ABX regimen can be determined, will discuss diuretic usage with Dr. Tenny Craw  LOS: 14 days    Ellwood Handler, PA-C 04/29/2022  DR Tenny Craw ADDENDUM PATIENT SEEN LOOKS GREAT, WALKING AROUND MENTALLY PATIENT IS CONCERNED RE INFECTION PLAN IS KEEP INPATIENT UNTIL MONDAY -TUESDAY OF NEXT WEEK FOR 1 WEEK OF ERTAPENEM.  WOULD NOT SEND PATIENT HOME WITH A PICC LINE GIVEN THAT HE IS TO SOME DEGREE IMMUNOSUPPRESSED, AMONG OTHER CONCERNS.  CAN DC TUESDAY NEXT WEEK, +/- 3-5 DAYS ORAL ANTIBIOTIC WHICH WOULD BE DETERMINED BY ID.  APPRECIATE ID RECS  Justice Rocher MD

## 2022-04-29 NOTE — Care Management Important Message (Signed)
Important Message  Patient Details  Name: Adrian Bell MRN: [id number] Date of Birth: 08-Jul-1945   Medicare Important Message Given:  Yes     Shelda Altes 04/29/2022, 8:47 AM

## 2022-04-29 NOTE — Progress Notes (Signed)
Physical Therapy Treatment Patient Details Name: Adrian Bell MRN: [id number] DOB: 16-Apr-1945 Today's Date: 04/29/2022   History of Present Illness Patient is a 77 y/o male who presents on 2/26 with NSTEMI, now s/p CABG x3 2/26. Found to have complicated UTI 3/6. PMH includes MG and MI.    PT Comments    Pt received in recliner, agreeable to therapy session with encouragement, pt c/o fatigue and defers hallway distance ambulation task. Pt able to perform transfers with Supervision using heart pillow and gait with light UE reliance on RW and Supervision at bedside. Pt very limited due to decreased endurance and demos increased work of breathing on exertion, SpO2 WFL on RA. BP stable standing to sitting. Pt pulling ~1,000 mL on IS. Pt continues to benefit from PT services to progress toward functional mobility goals.    Recommendations for follow up therapy are one component of a multi-disciplinary discharge planning process, led by the attending physician.  Recommendations may be updated based on patient status, additional functional criteria and insurance authorization.  Follow Up Recommendations  No PT follow up (pending progress, may need to consider HH/OP therapies due to decreased activity tolerance)     Assistance Recommended at Discharge PRN  Patient can return home with the following Help with stairs or ramp for entrance;Assist for transportation;A little help with walking and/or transfers;Assistance with cooking/housework;A little help with bathing/dressing/bathroom   Equipment Recommendations  Rolling walker (2 wheels)    Recommendations for Other Services       Precautions / Restrictions Precautions Precautions: Sternal Restrictions Weight Bearing Restrictions: No Other Position/Activity Restrictions: Sternal precs     Mobility  Bed Mobility               General bed mobility comments: OOB in recliner at beginning and end of session    Transfers Overall transfer  level: Needs assistance Equipment used: Rolling walker (2 wheels) Transfers: Sit to/from Stand Sit to Stand: Supervision           General transfer comment: chair t/f SBA no AD, pt using heart pillow; pt used RW once fully upright    Ambulation/Gait Ambulation/Gait assistance: Supervision Gait Distance (Feet): 12 Feet Assistive device: Rolling walker (2 wheels) Gait Pattern/deviations: Step-through pattern, Decreased stride length       General Gait Details: min cues for proximity to RW and sternal precs while using RW; RW adjusted for proper height, will continue to assess this due to limited distance at bedside; quick to fatigue and pt defers longer distance.   Stairs             Wheelchair Mobility    Modified Rankin (Stroke Patients Only)       Balance Overall balance assessment: Needs assistance Sitting-balance support: Bilateral upper extremity supported, Feet supported Sitting balance-Leahy Scale: Normal Sitting balance - Comments: Supported sitting in chair   Standing balance support: Bilateral upper extremity supported, During functional activity Standing balance-Leahy Scale: Good Standing balance comment: Supervision with RW                            Cognition Arousal/Alertness: Awake/alert Behavior During Therapy: WFL for tasks assessed/performed Overall Cognitive Status: Within Functional Limits for tasks assessed                                 General Comments: Quick to fatigue, decreased insight into need to  mobilize more        Exercises Other Exercises Other Exercises: IS x 5 reps ~1,000 mL, encouraged hourly use x5-10 reps Other Exercises: Reviewed seated lower extremity exercises and importance of frequent ambulation in room/hallway to build endurance    General Comments General comments (skin integrity, edema, etc.): Incision c/d/i; BP standing 154/73 (98) HR 79 bpm, pt lightheaded/fatigued; BP 121/71 (86)  sitting in recliner, HR 65 bpm      Pertinent Vitals/Pain Pain Assessment Pain Assessment: Faces Faces Pain Scale: Hurts a little bit Pain Location: sternal incision intermittently Pain Descriptors / Indicators: Discomfort, Sore Pain Intervention(s): Monitored during session, Repositioned     PT Goals (current goals can now be found in the care plan section) Acute Rehab PT Goals Patient Stated Goal: to get better and go home PT Goal Formulation: With patient Time For Goal Achievement: 05/11/22 Progress towards PT goals: Progressing toward goals (slowly)    Frequency    Min 3X/week      PT Plan Current plan remains appropriate       AM-PAC PT "6 Clicks" Mobility   Outcome Measure  Help needed turning from your back to your side while in a flat bed without using bedrails?: A Little Help needed moving from lying on your back to sitting on the side of a flat bed without using bedrails?: A Little Help needed moving to and from a bed to a chair (including a wheelchair)?: A Little Help needed standing up from a chair using your arms (e.g., wheelchair or bedside chair)?: A Little Help needed to walk in hospital room?: A Little Help needed climbing 3-5 steps with a railing? : A Lot 6 Click Score: 17    End of Session   Activity Tolerance: Patient limited by fatigue Patient left: in chair;with call bell/phone within reach;Other (comment) (BLE elevated on recliner, heels floated) Nurse Communication: Mobility status PT Visit Diagnosis: Difficulty in walking, not elsewhere classified (R26.2);Muscle weakness (generalized) (M62.81)     Time: [id number] PT Time Calculation (min) (ACUTE ONLY): 19 min  Charges:  $Therapeutic Activity: 8-22 mins                     Huyen Perazzo P., PTA Acute Rehabilitation Services Secure Chat Preferred 9a-5:30pm Office: [place] 04/29/2022, 6:34 PM

## 2022-04-29 NOTE — Progress Notes (Signed)
Seen pt for ambulation however pt declined d/t lack of energy. Pt has not been able to eat breakfast d/t food not tasting good. Pt reports that he's going to try to force himself to eat so that he can walk later. Will f/u later.  Christen Bame 04/29/2022 10:16 AM

## 2022-04-29 NOTE — Progress Notes (Signed)
Mobility Specialist Progress Note:   04/29/22 1100  Mobility  Activity Ambulated with assistance in hallway  Level of Assistance Standby assist, set-up cues, supervision of patient - no hands on  Assistive Device Front wheel walker  Distance Ambulated (ft) 220 ft  Activity Response Tolerated well  $Mobility charge 1 Mobility   Pt in chair willing to participate in mobility. No complaints of pain. Required 1 seated break d/t fatigue. Left in chair with call bell in reach and all needs met.   Gareth Eagle Mort Smelser Mobility Specialist Please contact via [employer] or  Rehab Office at [phone]

## 2022-04-30 ENCOUNTER — Other Ambulatory Visit: Payer: Self-pay | Admitting: Physician Assistant

## 2022-04-30 LAB — BASIC METABOLIC PANEL
Anion gap: 10 (ref 5–15)
BUN: 17 mg/dL (ref 8–23)
CO2: 24 mmol/L (ref 22–32)
Calcium: 8 mg/dL — ABNORMAL LOW (ref 8.9–10.3)
Chloride: 101 mmol/L (ref 98–111)
Creatinine, Ser: 0.88 mg/dL (ref 0.61–1.24)
GFR, Estimated: >60 mL/min (ref >60)
Glucose, Bld: 111 mg/dL — ABNORMAL HIGH (ref 70–99)
Potassium: 4.3 mmol/L (ref 3.5–5.1)
Sodium: 135 mmol/L (ref 135–145)

## 2022-04-30 LAB — CBC
HCT: 22.6 % — ABNORMAL LOW (ref 39.0–52.0)
Hemoglobin: 7.3 g/dL — ABNORMAL LOW (ref 13.0–17.0)
MCH: 28.9 pg (ref 26.0–34.0)
MCHC: 32.3 g/dL (ref 30.0–36.0)
MCV: 89.3 fL (ref 80.0–100.0)
Platelets: 266 10*3/uL (ref 150–400)
RBC: 2.53 MIL/uL — ABNORMAL LOW (ref 4.22–5.81)
RDW: 14.6 % (ref 11.5–15.5)
WBC: 12.3 10*3/uL — ABNORMAL HIGH (ref 4.0–10.5)
nRBC: 0.4 % — ABNORMAL HIGH (ref 0.0–0.2)

## 2022-04-30 MED ORDER — SULFAMETHOXAZOLE-TRIMETHOPRIM 800-160 MG PO TABS
2.0000 | ORAL_TABLET | Freq: Two times a day (BID) | ORAL | Status: DC
  Administered 2022-04-30: 2 via ORAL
  Filled 2022-04-30: qty 2

## 2022-04-30 MED ORDER — TRAMADOL HCL 50 MG PO TABS: 50.0000 mg | ORAL_TABLET | Freq: Four times a day (QID) | ORAL | 0 refills | Status: DC | PRN

## 2022-04-30 MED ORDER — CLOPIDOGREL BISULFATE 75 MG PO TABS: 75.0000 mg | ORAL_TABLET | Freq: Every day | ORAL | 3 refills | Status: DC

## 2022-04-30 MED ORDER — METOPROLOL TARTRATE 25 MG PO TABS: 25.0000 mg | ORAL_TABLET | Freq: Two times a day (BID) | ORAL | 3 refills | Status: DC

## 2022-04-30 MED ORDER — SULFAMETHOXAZOLE-TRIMETHOPRIM 800-160 MG PO TABS: 2.0000 | ORAL_TABLET | Freq: Two times a day (BID) | ORAL | 0 refills | Status: DC

## 2022-04-30 MED ORDER — TRAMADOL HCL 50 MG PO TABS: 50.0000 mg | ORAL_TABLET | ORAL | 0 refills | Status: DC | PRN

## 2022-04-30 MED ORDER — ACETAMINOPHEN 325 MG PO TABS: 650.0000 mg | ORAL_TABLET | ORAL | Status: AC | PRN

## 2022-04-30 MED ORDER — AMLODIPINE BESYLATE 5 MG PO TABS: 5.0000 mg | ORAL_TABLET | Freq: Every day | ORAL | 0 refills | Status: DC

## 2022-04-30 MED ORDER — SULFAMETHOXAZOLE-TRIMETHOPRIM 800-160 MG PO TABS: 1.0000 | ORAL_TABLET | Freq: Two times a day (BID) | ORAL | 0 refills | Status: DC

## 2022-04-30 MED FILL — Sodium Chloride IV Soln 0.9%: INTRAVENOUS | Qty: 2000 | Status: AC

## 2022-04-30 MED FILL — Mannitol IV Soln 20%: INTRAVENOUS | Qty: 500 | Status: AC

## 2022-04-30 MED FILL — Albumin, Human Inj 5%: INTRAVENOUS | Qty: 250 | Status: AC

## 2022-04-30 MED FILL — Electrolyte-R (PH 7.4) Solution: INTRAVENOUS | Qty: 5000 | Status: AC

## 2022-04-30 MED FILL — Sodium Bicarbonate IV Soln 8.4%: INTRAVENOUS | Qty: 50 | Status: AC

## 2022-04-30 MED FILL — Heparin Sodium (Porcine) Inj 1000 Unit/ML: INTRAMUSCULAR | Qty: 30 | Status: AC

## 2022-04-30 MED FILL — Heparin Sodium (Porcine) Inj 1000 Unit/ML: INTRAMUSCULAR | Qty: 10 | Status: AC

## 2022-04-30 MED FILL — Calcium Chloride Inj 10%: INTRAVENOUS | Qty: 10 | Status: AC

## 2022-04-30 NOTE — Progress Notes (Signed)
[facility] for Infectious Disease  Date of Admission:  04/15/2022   Total days of inpatient antibiotics 7  Principal Problem:   NSTEMI (non-ST elevated myocardial infarction) ([place]) Active Problems:   S/P thymectomy   S/P CABG x 3          Assessment: Adrian Bell admitted for CABG x3 developed fevers POD6(3/3) found to have hafnia bacteremia 2/2 unclear source.   #Hafnia alvei bacteremia 2/2 unclear source #Myasthenia gravis(MG) on prednisone #SP CABG x3 using LIMA, right greater saphenous vein and coronary endarterectomy, Lcx, thymectomy on 2/26 #Leukocytosis trending down #Fever-resolved     -UA benign( on 3/3-negative nitrite/leukocytes estrace) piror to starting antibiotics. Adrian Bell has not GI complaints.  I examined th pt's post surgical wounds and noted one prior chest tube site slightly more erythematous than the rest.  Although Adrian Bell has no respiratory complaints, given his immunosuppression I think the chest tube site may have been port of entry fro bacteremia. Hafnia bacteremia 2/2 PNA noted in literature. Prior R IJ site is not concerning for infection, fem salvage wound healing well.  Hafnia alvei is generally sens to cipro, but given MG will avoid as it may exacerbate MG symptoms   Recommendations:  -D/C ertapenem.  -Start bactrim DS 2 tab PO bid(8 mg/kg/day) x 12 days to complete 2 weeks of antibiotics from starting ertapenem.  ID will sign off Microbiology:   Antibiotics: Cefepime 3/4-5 Ceftriaxone 3/6 Pip-tazo 3/3-4 Ertapenem 3/6- Cultures: Blood 04/25/22 2/2 Hafnia alvei  SUBJECTIVE: No new complaints Interval: Afebrile overnight  Review of Systems: Review of Systems  All other systems reviewed and are negative.    Scheduled Meds:  aspirin EC  81 mg Oral Daily   bisacodyl  10 mg Oral Daily   clopidogrel  75 mg Oral Daily   docusate sodium  200 mg Oral Daily   enoxaparin (LOVENOX) injection  40 mg Subcutaneous QHS   Fe Fum-Vit C-Vit B12-FA  1  capsule Oral QPC breakfast   metoprolol tartrate  25 mg Oral BID   pantoprazole  40 mg Oral Daily   predniSONE  10 mg Oral QAC breakfast   ramipril  5 mg Oral Daily   sodium chloride flush  3 mL Intravenous Q12H   sulfamethoxazole-trimethoprim  2 tablet Oral Q12H   Continuous Infusions: PRN Meds:.acetaminophen, ondansetron (ZOFRAN) IV, mouth rinse, oxyCODONE, sodium chloride flush, traMADol Allergies  Allergen Reactions   Zetia [Ezetimibe] Other (See Comments)    Weakness, shortness of breath   Demerol [Meperidine Hcl]    Statins Other (See Comments)    myalgias    OBJECTIVE: Vitals:   04/30/22 0300 04/30/22 0531 04/30/22 0749 04/30/22 1100  BP: 135/73  (!) 148/71   Pulse: (!) 59  87 84  Resp: '18  20 20  '$ Temp: 97.9 F (36.6 C)  97.9 F (36.6 C)   TempSrc: Oral  Oral   SpO2: 100%  99% 98%  Weight:  81.4 kg    Height:       Body mass index is 28.97 kg/m.  Physical Exam Constitutional:      General: Adrian Bell is not in acute distress.    Appearance: Adrian Bell is normal weight. Adrian Bell is not toxic-appearing.  HENT:     Head: Normocephalic and atraumatic.     Right Ear: External ear normal.     Left Ear: External ear normal.     Nose: No congestion or rhinorrhea.     Mouth/Throat:  Mouth: Mucous membranes are moist.     Pharynx: Oropharynx is clear.  Eyes:     Extraocular Movements: Extraocular movements intact.     Conjunctiva/sclera: Conjunctivae normal.     Pupils: Pupils are equal, round, and reactive to light.  Cardiovascular:     Rate and Rhythm: Normal rate and regular rhythm.     Heart sounds: No murmur heard.    No friction rub. No gallop.     Comments: Sternal post surgical wound Chest tube  wound Pulmonary:     Effort: Pulmonary effort is normal.     Breath sounds: Normal breath sounds.  Abdominal:     General: Abdomen is flat. Bowel sounds are normal.     Palpations: Abdomen is soft.  Musculoskeletal:        General: No swelling. Normal range of motion.      Cervical back: Normal range of motion and neck supple.  Skin:    General: Skin is warm and dry.  Neurological:     General: No focal deficit present.     Mental Status: Adrian Bell is oriented to person, place, and time.  Psychiatric:        Mood and Affect: Mood normal.       Lab Results Lab Results  Component Value Date   WBC 12.3 (H) 04/30/2022   HGB 7.3 (L) 04/30/2022   HCT 22.6 (L) 04/30/2022   MCV 89.3 04/30/2022   PLT 266 04/30/2022    Lab Results  Component Value Date   CREATININE 0.88 04/30/2022   BUN 17 04/30/2022   NA 135 04/30/2022   K 4.3 04/30/2022   CL 101 04/30/2022   CO2 24 04/30/2022    Lab Results  Component Value Date   ALT 14 04/18/2022   AST 27 04/18/2022   ALKPHOS 54 04/18/2022   BILITOT 0.7 04/18/2022        Laurice Record, [place] for Infectious Disease [facility] 04/30/2022, 12:22 PM

## 2022-04-30 NOTE — TOC Transition Note (Signed)
Transition of Care (TOC) - CM/SW Discharge Note Marvetta Gibbons RN, BSN Transitions of Care Unit 4E- RN Case Manager See Treatment Team for direct phone #   Patient Details  Name: Adrian Bell MRN: [id number] Date of Birth: 06-10-1945  Transition of Care [facility]) CM/SW Contact:  Dawayne Patricia, RN Phone Number: 04/30/2022, 3:24 PM   Clinical Narrative:    Pt stable for transition home today, DME- RW has been ordered from [facility] - contracted agency with insurance. RW- to be delivered to room prior to discharge.  No further TOC needs noted- family to transport home.    Final next level of care: Home/Self Care Barriers to Discharge: Barriers Resolved   Patient Goals and CMS Choice CMS Medicare.gov Compare Post Acute Care list provided to:: Patient Choice offered to / list presented to : NA  Discharge Placement                   Home      Discharge Plan and Services Additional resources added to the After Visit Summary for     Discharge Planning Services: CM Consult Post Acute Care Choice: Durable Medical Equipment          DME Arranged: Gilford Rile rolling DME Agency: [employer] Date DME Agency Contacted: 04/29/22 Time DME Agency Contacted: 1600 Representative spoke with at DME Agency: Erasmo Downer HH Arranged: NA [place] Agency: NA        Social Determinants of Health ([place]) Interventions SDOH Screenings   Food Insecurity: No Food Insecurity (04/21/2022)  Housing: Low Risk  (04/21/2022)  Transportation Needs: No Transportation Needs (04/21/2022)  Utilities: Not At Risk (04/21/2022)  Tobacco Use: Medium Risk (04/26/2022)     Readmission Risk Interventions    04/30/2022    3:24 PM  Readmission Risk Prevention Plan  Transportation Screening Complete  PCP or Specialist Appt within 5-7 Days Complete  Home Care Screening Complete  Medication Review (RN CM) Complete

## 2022-04-30 NOTE — Progress Notes (Signed)
Pt and wife received OHS book and education on restrictions, heart healthy diet, ex guidelines, Move in the Tube sheet, incentive spirometer use when d/c and CRPII. Pt denies questions and was encouraged to look in the book for additional information. Referral placed to [facility] .  \ Pt declined ambulation d/t tiredness  Pt is not interested in OP rehab at this however referral placed in case he changes his mind.  Pt and wife very receptive to education  Christen Bame 04/30/2022 11:48 AM   [id number]

## 2022-04-30 NOTE — Progress Notes (Addendum)
      [address]       [place] [address]             [phone]      11 Days Post-Op Procedure(s) (LRB): CORONARY ARTERY BYPASS GRAFTING (CABG) X3 USING LEFT INTERNAL MAMMARY ARTERY (LIMA) AND ENDOSCOPICALLY HARVESTED RIGHT GREATER SAPHENOUS VEIN AND CORONARY ENDARTERECTOMY (N/A) TRANSESOPHAGEAL ECHOCARDIOGRAM (N/A) THYMECTOMY (N/A) CLIPPING OF ATRIAL APPENDAGE USING ATRICURE 40MM ATRICLIP  Subjective:  Patient feeling pretty good.  He gets worn out easily with ambulation.  Hoping to go home.  Objective: Vital signs in last 24 hours: Temp:  [97.9 F (36.6 C)-98.7 F (37.1 C)] 97.9 F (36.6 C) (03/08 0300) Pulse Rate:  [57-91] 59 (03/08 0300) Cardiac Rhythm: Supraventricular tachycardia (03/07 1922) Resp:  [14-20] 18 (03/08 0300) BP: (118-136)/(64-73) 135/73 (03/08 0300) SpO2:  [95 %-100 %] 100 % (03/08 0300) Weight:  [81.4 kg] 81.4 kg (03/08 0531)  Intake/Output from previous day: 03/07 0701 - 03/08 0700 In: 820 [P.O.:720; IV Piggyback:100] Out: 525 [Urine:525]  General appearance: alert, cooperative, and no distress Heart: regular rate and rhythm Lungs: clear to auscultation bilaterally Abdomen: soft, non-tender; bowel sounds normal; no masses,  no organomegaly Extremities: edema trace Wound: clean and dry  Lab Results: Recent Labs    04/29/22 0147 04/30/22 0130  WBC 16.4* 12.3*  HGB 8.1* 7.3*  HCT 24.7* 22.6*  PLT 230 266   BMET:  Recent Labs    04/29/22 0147 04/30/22 0130  NA 134* 135  K 4.8 4.3  CL 102 101  CO2 24 24  GLUCOSE 118* 111*  BUN 20 17  CREATININE 0.93 0.88  CALCIUM 8.4* 8.0*    PT/INR: No results for input(s): "LABPROT", "INR" in the last 72 hours. ABG    Component Value Date/Time   PHART 7.339 (L) 04/20/2022 0751   HCO3 25.6 04/26/2022 0839   TCO2 27 04/26/2022 0839   ACIDBASEDEF 9.0 (H) 04/20/2022 0751   O2SAT 32 04/26/2022 0839   CBG (last 3)  Recent Labs    04/29/22 1306  GLUCAP 200*     Assessment/Plan: S/P Procedure(s) (LRB): CORONARY ARTERY BYPASS GRAFTING (CABG) X3 USING LEFT INTERNAL MAMMARY ARTERY (LIMA) AND ENDOSCOPICALLY HARVESTED RIGHT GREATER SAPHENOUS VEIN AND CORONARY ENDARTERECTOMY (N/A) TRANSESOPHAGEAL ECHOCARDIOGRAM (N/A) THYMECTOMY (N/A) CLIPPING OF ATRIAL APPENDAGE USING ATRICURE 40MM ATRICLIP  CV- NSR, BP stable- continue Lopressor, Altace Pulm- off oxygen, continue IS Renal-creatinine has been stable, weight has been stable ID- afebrile, leukocytosis improved... patient's blood culture is growing Hafnia Alvei- currently on Ertapenem.. per sensitivities sensitive to Bactrim DS.Marland Kitchen per ID note to transition to oral ABX today Myasthenia Gravis- stable, continue baseline steroids Dispo- patient stable, ABX to be transitioned to Bactrim DS by ID today, can potentially d/c today if okay with Dr. Tenny Craw   LOS: 15 days    Ellwood Handler, PA-C 04/30/2022   Afebrile in 3 days ID recs Bactrim  Has rec'd IV abx Wed Thur Friday  and WC is 12, afebrile. I thought we would keep for IV abx over weekend, but if they want po as think it is as effective - OK  Needs to be home on ASA/Plavix  PA to see next week in clinic. Can confer with MD if needed. I'll be out of town but reachable electronically.

## 2022-04-30 NOTE — Progress Notes (Signed)
Physical Therapy Treatment Patient Details Name: Adrian Bell MRN: [id number] DOB: 18-Apr-1945 Today's Date: 04/30/2022   History of Present Illness Patient is a 77 y/o male who presents on 2/26 with NSTEMI, now s/p CABG x3 2/26. Found to have complicated UTI 3/6. PMH includes MG and MI.    PT Comments    Patient not progressing towards PT goals due to high level of fatigue with minimal activity. Reports frequent rest breaks. Unable to stand for >30 seconds without worsening fatigue and tiredness and needing to sit down. VSS on RA despite 3/4 DOE with activity. Unable to attempt stair training today due to poor endurance and activity tolerance but verbally reviewed technique. Discussed importance of having chairs placed around house to have places to sit when needed during exercise. Discharge recommendation updated to home with HHPT due to fatigue and poor activity tolerance. Will follow.    Recommendations for follow up therapy are one component of a multi-disciplinary discharge planning process, led by the attending physician.  Recommendations may be updated based on patient status, additional functional criteria and insurance authorization.  Follow Up Recommendations  Home health PT     Assistance Recommended at Discharge PRN  Patient can return home with the following Help with stairs or ramp for entrance;Assist for transportation;A little help with walking and/or transfers;Assistance with cooking/housework;A little help with bathing/dressing/bathroom   Equipment Recommendations  Rolling walker (2 wheels) (delivered)    Recommendations for Other Services       Precautions / Restrictions Precautions Precautions: Sternal Restrictions Weight Bearing Restrictions: No Other Position/Activity Restrictions: Sternal precs     Mobility  Bed Mobility               General bed mobility comments: On toilet upon PT arrival.    Transfers Overall transfer level: Needs  assistance Equipment used: None, Rolling walker (2 wheels) Transfers: Sit to/from Stand Sit to Stand: Supervision, Min assist           General transfer comment: Min A to stand from low toilet with across chest and supervision from recliner.    Ambulation/Gait Ambulation/Gait assistance: Supervision Gait Distance (Feet): 15 Feet (+ 40') Assistive device: Rolling walker (2 wheels) Gait Pattern/deviations: Step-through pattern, Decreased stride length Gait velocity: decreased Gait velocity interpretation: 1.31 - 2.62 ft/sec, indicative of limited community ambulator   General Gait Details: Slow, steady gait with and without use of RW for support. Fatigues very quickly with 3/4 DOE. 1 seated rest break after walking back from bathroom, unable to stand statically for >30 seconds before fatiguing and needing to sit.   Stairs             Wheelchair Mobility    Modified Rankin (Stroke Patients Only)       Balance Overall balance assessment: Needs assistance Sitting-balance support: Feet supported, No upper extremity supported Sitting balance-Leahy Scale: Normal     Standing balance support: During functional activity Standing balance-Leahy Scale: Fair Standing balance comment: Supervision with RW                            Cognition Arousal/Alertness: Awake/alert Behavior During Therapy: WFL for tasks assessed/performed Overall Cognitive Status: Within Functional Limits for tasks assessed                                 General Comments: Quick to fatigue, decreased insight into need to  mobilize more        Exercises      General Comments General comments (skin integrity, edema, etc.): VSS on RA despite 3/4 DOE.      Pertinent Vitals/Pain Pain Assessment Pain Assessment: Faces Faces Pain Scale: Hurts a little bit Pain Location: sternal incision intermittently Pain Descriptors / Indicators: Discomfort, Sore Pain Intervention(s):  Monitored during session, Repositioned    Home Living                          Prior Function            PT Goals (current goals can now be found in the care plan section) Progress towards PT goals: Not progressing toward goals - comment (due to fatigue)    Frequency    Min 3X/week      PT Plan Discharge plan needs to be updated    Co-evaluation              AM-PAC PT "6 Clicks" Mobility   Outcome Measure  Help needed turning from your back to your side while in a flat bed without using bedrails?: A Little Help needed moving from lying on your back to sitting on the side of a flat bed without using bedrails?: A Little Help needed moving to and from a bed to a chair (including a wheelchair)?: A Little Help needed standing up from a chair using your arms (e.g., wheelchair or bedside chair)?: A Little Help needed to walk in hospital room?: A Little Help needed climbing 3-5 steps with a railing? : A Little 6 Click Score: 18    End of Session   Activity Tolerance: Patient limited by fatigue Patient left: in chair;with call bell/phone within reach Nurse Communication: Mobility status PT Visit Diagnosis: Difficulty in walking, not elsewhere classified (R26.2);Muscle weakness (generalized) (M62.81)     Time: [id number] PT Time Calculation (min) (ACUTE ONLY): 15 min  Charges:  $Therapeutic Activity: 8-22 mins                     Marisa Severin, PT, DPT Acute Rehabilitation Services Secure chat preferred Office [phone]      Adrian Bell 04/30/2022, 11:59 AM

## 2022-04-30 NOTE — Progress Notes (Addendum)
Bedside NIF -30, VC 1.68. Relayed information via secure chat to Enter, Pierre Bali, MD. VS stable, 98% RA

## 2022-05-04 ENCOUNTER — Ambulatory Visit
Admission: RE | Admit: 2022-05-04 | Discharge: 2022-05-04 | Disposition: A | Discharge status: Home / Self Care | Payer: Medicare HMO | Source: Ambulatory Visit | Attending: Physician Assistant | Admitting: Physician Assistant

## 2022-05-04 ENCOUNTER — Ambulatory Visit (INDEPENDENT_AMBULATORY_CARE_PROVIDER_SITE_OTHER): Payer: Self-pay | Admitting: Surgical

## 2022-05-04 VITALS — BP 132/68 | HR 74 | Resp 18 | Ht 66.0 in | Wt 170.0 lb

## 2022-05-04 DIAGNOSIS — R0602 Shortness of breath: Secondary | ICD-10-CM | POA: Diagnosis not present

## 2022-05-04 DIAGNOSIS — Z951 Presence of aortocoronary bypass graft: Secondary | ICD-10-CM

## 2022-05-04 MED ORDER — FUROSEMIDE 40 MG PO TABS: 40.0000 mg | ORAL_TABLET | Freq: Every day | ORAL | 0 refills | Status: DC

## 2022-05-04 MED ORDER — POTASSIUM CHLORIDE CRYS ER 20 MEQ PO TBCR: 20.0000 meq | EXTENDED_RELEASE_TABLET | Freq: Every day | ORAL | 0 refills | Status: DC

## 2022-05-04 NOTE — Patient Instructions (Signed)
Continue as previous instructions at hospital discharge

## 2022-05-04 NOTE — Progress Notes (Signed)
[address]       [place] [address]             [phone]      Adrian Bell Medical Record [id number] Date of Birth: 1945-10-03  Referring: [name], MD Primary [facility]: [facility] Primary Cardiologist: Shirlee More, MD   Chief Complaint:   POST OP FOLLOW UP    OPERATIVE NOTE: Patient Name: Adrian Bell Date of Birth: 1945-10-07 Date of Operation: 04/19/22   PRE-OPERATIVE DIAGNOSIS: Coronary artery disease Myasthenia Gravis with bulbar symptomatology History of PCI   POST-OPERATIVE DIAGNOSIS: Same   OPERATION: CABG x 3 (LIMA - LAD, SVG - OM, SVG - PDA) Coronary endarterectomy, left circumflex Thymectomy Endoscopic saphenous vein harvest Left atrial appendage occlusion (Atriclip 52m)   SURGEON: DPierre BaliEnter MD   ASSISTANT: EEllwood HandlerPA   EBL 100cc   FINDINGS: EF 55-60% before surgery EF same after surgery   Adequate conduits  Poor OM target with one soft spot, not unexpectedly required large endarterectomy.    LAD 1.75 mm OM 2.0 mm, flow 80 cc/min, ~4-5 cm endarterectomy PDA 1.732m flow 40cc/min   TIMES: XC:  173 min CPB:  203 min   SPECIMENS Endarterectomy segment of left circumflex (partial), approximately 1.123456 COMPLICATIONS: None       History of Present Illness: Patient is a 89ear old male seen in the office on today's in routine postsurgical follow-up status post the above described procedure.  Overall he feels as though he is making slow but steady progress.  His primary complaint is shortness of breath.  Pain is reasonably well-controlled.  He does have some mild lower extremity edema.  He denies fevers, chills or other significant constitutional symptoms.  He is not having any cough or productive sputum.              Past Medical History:  Diagnosis Date   Dysphagia    Heart attack ([facility]   Hyperlipidemia    Kidney stones    [facility]ia [facility]is [place]   STEMI  (ST elevation myocardial infarction) ([facility]     Social History   Tobacco Use  Smoking Status Never  Smokeless Tobacco Former   Types: Chew   Quit date: 2009    Social History   Substance and Sexual Activity  Alcohol Use Yes   Comment: occ      Allergies  Allergen Reactions   Zetia [Ezetimibe] Other (See Comments)    Weakness, shortness of breath   Demerol [Meperidine Hcl]    Statins Other (See Comments)    myalgias    Current Outpatient Medications  Medication Sig Dispense Refill   acetaminophen (TYLENOL) 325 MG tablet Take 2 tablets (650 mg total) by mouth every 4 (four) hours as needed for fever or mild pain.     alum & mag hydroxide-simeth (MINTOX) 200-200-20 MG/5ML suspension Take 30 mLs by mouth as needed for indigestion or heartburn.     amLODipine (NORVASC) 5 MG tablet Take 1 tablet (5 mg total) by mouth daily. Please take while using Bactrim DS (antibiotic) then discontinue and resume Altace 24 tablet 0   aspirin 81 MG chewable tablet Chew 1 tablet (81 mg total) by mouth daily. 90 tablet 1   Calcium Carbonate-Vit D-Min (CALCIUM 1200 PO) Take 1 tablet by mouth daily.     clopidogrel (PLAVIX) 75 MG tablet Take 1 tablet (75 mg total) by mouth daily. 30 tablet  3   Cyanocobalamin (VITAMIN B-12 PO) Take 1 tablet by mouth daily.     ferrous gluconate (FERGON) 324 MG tablet Take 1 tablet by mouth 3 (three) times a week. Monday, Wednesday AND Friday     furosemide (LASIX) 40 MG tablet Take 1 tablet (40 mg total) by mouth daily. 7 tablet 0   metoprolol tartrate (LOPRESSOR) 25 MG tablet Take 1 tablet (25 mg total) by mouth 2 (two) times daily. 60 tablet 3   nitroGLYCERIN (NITROSTAT) 0.4 MG SL tablet Place 1 tablet (0.4 mg total) under the tongue every 5 (five) minutes as needed for chest pain. 25 tablet 1   Pitavastatin Calcium 2 MG TABS Take 1 tablet (2 mg total) by mouth daily. (Patient taking differently: Take 2 mg by mouth See admin instructions. Take 2 mg (1 tablet) by  mouth on Monday's and Friday's.) 90 tablet 3   potassium chloride SA (KLOR-CON M) 20 MEQ tablet Take 1 tablet (20 mEq total) by mouth daily. 7 tablet 0   predniSONE (DELTASONE) 10 MG tablet Take 1 tablet (10 mg total) by mouth daily with breakfast. 90 tablet 3   sulfamethoxazole-trimethoprim (BACTRIM DS) 800-160 MG tablet Take 2 tablets by mouth every 12 (twelve) hours. 30 tablet 0   traMADol (ULTRAM) 50 MG tablet Take 1 tablet (50 mg total) by mouth every 6 (six) hours as needed for moderate pain. 30 tablet 0   No current facility-administered medications for this visit.       Physical Exam: BP 132/68 (BP Location: Left Arm, Patient Position: Sitting)   Pulse 74   Resp 18   Ht '5\' 6"'$  (1.676 m)   Wt 170 lb (77.1 kg)   SpO2 93% Comment: RA  BMI 27.44 kg/m   General appearance: alert, cooperative, and no distress Heart: regular rate and rhythm Lungs: Mildly diminished in the left base Abdomen: Benign Extremities: Minor ankle edema Wound: Incisions healing well without evidence of infection   Diagnostic Studies & Laboratory data:     Recent Radiology Findings:   No results found.    Recent Lab Findings: Lab Results  Component Value Date   WBC 12.3 (H) 04/30/2022   HGB 7.3 (L) 04/30/2022   HCT 22.6 (L) 04/30/2022   PLT 266 04/30/2022   GLUCOSE 111 (H) 04/30/2022   CHOL 216 (H) 11/10/2021   TRIG 202 (H) 11/10/2021   HDL 58 11/10/2021   LDLCALC 123 (H) 11/10/2021   ALT 14 04/18/2022   AST 27 04/18/2022   NA 135 04/30/2022   K 4.3 04/30/2022   CL 101 04/30/2022   CREATININE 0.88 04/30/2022   BUN 17 04/30/2022   CO2 24 04/30/2022   TSH 2.32 11/25/2021   INR 1.4 (H) 04/19/2022   HGBA1C 5.5 04/18/2022    Chest x-ray: reviewed shows a small left sided pleural effusion with some basilar airspace disease.  Assessment / Plan: He is making slow but steady progress.  Oxygen saturations are good on room air.  His lung exam is mostly unremarkable but with small effusion it  is probably worthwhile to try a short course of diuretics.  He will continue his Bactrim until completion for his Hafnia Alvie positive blood cultures as recommended by infectious disease consult.  I did remove the 2 remaining sutures in the lower portion of the sternotomy incision.  It is healing well.      Medication Changes: Meds ordered this encounter  Medications   furosemide (LASIX) 40 MG tablet    Sig:  Take 1 tablet (40 mg total) by mouth daily.    Dispense:  7 tablet    Refill:  0    Order Specific Question:   Supervising Provider    Answer:   Neomia Glass [id number]   potassium chloride SA (KLOR-CON M) 20 MEQ tablet    Sig: Take 1 tablet (20 mEq total) by mouth daily.    Dispense:  7 tablet    Refill:  0    Order Specific Question:   Supervising Provider    Answer:   Neomia Glass [id number]      John Giovanni, PA-C  05/04/2022 2:24 PM

## 2022-05-05 ENCOUNTER — Telehealth: Payer: Self-pay | Admitting: *Deleted

## 2022-05-05 NOTE — Telephone Encounter (Signed)
Adrian Bell, patient's wife, contacted the office stating patient was started on Lasix and Potassium yesterday. Patient took a dose last night without complication. Per wife, after this morning's dose, patient vomited. Wife concerned vomiting was caused by medications. Wife states patient ate prior to taking. Patient states stomach was aching prior to taking meds as he has not had a BM since discharge. Patient states in the interim between initial phone call and my return phone call with patient he has had a BM and his stomach is feeling better. Advised patient to continue taking dulcolax for constipation and lasix regimen tomorrow as prescribed. Advised to contact our office if nausea persists.

## 2022-05-06 NOTE — Progress Notes (Signed)
      [address]       [place] [address]             [phone]                    Adrian Bell [name] Medical Record [id number] Date of Birth: 04/28/1945  Referring: [place] Primary Cardiologist: Adrian More, MD  PRE-OPERATIVE DIAGNOSIS: Coronary artery disease Myasthenia Gravis with bulbar symptomatology History of PCI   POST-OPERATIVE DIAGNOSIS: Same   OPERATION: CABG x 3 (LIMA - LAD, SVG - OM, SVG - PDA) Coronary endarterectomy, left circumflex Thymectomy Endoscopic saphenous vein harvest Left atrial appendage occlusion (Atriclip 51mm)    Returned to walking at home, has refused PT.  Feels better than before surgery   Is off ranolazine and nitrate.   PHYSICAL EXAMINATION: There were no vitals taken for this visit.  Gen: NAD Neuro: Alert and oriented, stutter present myasthenia. Chest: incision healing well Resp: Nonlaboured Abd: Soft, ntnd, 1cm x 0.8cm abdominal chest tube site. Debrided.  Extr: WWP, 2-3+ LE edema bilateral  Diagnostic Studies & Laboratory data:    XR today: lungs well inflated.    Assessment / Plan:   S/P CABG, endarterectomy, thymectomy, LAA closure on 04/19/22  Weighs 172lb, weight 180 preop.   Though 3+ LE edema - start on lasix 20 po daily x 2 weeks.   Finished bactrim for hx of bacteremia s/p CAB  Incisions healing well, is on 10mg  pred and this has likely resulted in small open chest tube site.   Is on ASA/Plavix - advise this for at least 1 year as endartectomy, lifelong would be better as hx of stenting as well.    Plan to see back in 3 weeks with PA.   Adrian Bell 05/06/2022 3:29 PM

## 2022-05-07 ENCOUNTER — Telehealth (HOSPITAL_COMMUNITY): Payer: Self-pay

## 2022-05-07 NOTE — Telephone Encounter (Signed)
Referral faxed to . 

## 2022-05-14 ENCOUNTER — Other Ambulatory Visit: Payer: Self-pay | Admitting: Cardiothoracic Surgery

## 2022-05-14 DIAGNOSIS — Z951 Presence of aortocoronary bypass graft: Secondary | ICD-10-CM

## 2022-05-20 ENCOUNTER — Ambulatory Visit (INDEPENDENT_AMBULATORY_CARE_PROVIDER_SITE_OTHER): Payer: Self-pay | Admitting: Cardiothoracic Surgery

## 2022-05-20 ENCOUNTER — Telehealth: Payer: Self-pay

## 2022-05-20 ENCOUNTER — Ambulatory Visit
Admission: RE | Admit: 2022-05-20 | Discharge: 2022-05-20 | Disposition: A | Discharge status: Home / Self Care | Payer: Medicare HMO | Source: Ambulatory Visit | Attending: Cardiothoracic Surgery | Admitting: Cardiothoracic Surgery

## 2022-05-20 ENCOUNTER — Other Ambulatory Visit: Payer: Self-pay

## 2022-05-20 VITALS — BP 128/73 | HR 59 | Resp 20 | Ht 66.0 in | Wt 172.0 lb

## 2022-05-20 DIAGNOSIS — Z951 Presence of aortocoronary bypass graft: Secondary | ICD-10-CM

## 2022-05-20 DIAGNOSIS — J90 Pleural effusion, not elsewhere classified: Secondary | ICD-10-CM | POA: Diagnosis not present

## 2022-05-20 DIAGNOSIS — I700 Atherosclerosis of aorta: Secondary | ICD-10-CM | POA: Diagnosis not present

## 2022-05-20 MED ORDER — FUROSEMIDE 20 MG PO TABS: 20.0000 mg | ORAL_TABLET | Freq: Every day | ORAL | 0 refills | Status: DC

## 2022-05-20 NOTE — Progress Notes (Signed)
20 mg Lasix prescribed by Dr. Tenny Craw and sent into patient's preferred pharmacy.

## 2022-05-20 NOTE — Telephone Encounter (Signed)
Called patient and informed him of Dr. Serita Grit advice "Patient needs evaluation in the office  Please use a work-in next week.  If he has worsening weakness, difficulty with speech/swallow, or new problems with shortness of breath, he needs to go to the ER for evaluation of myasthenia exacerbation."  Patient verbalized understanding and is aware someone from the front will give him a call to get him worked in on Wednesday.

## 2022-05-20 NOTE — Telephone Encounter (Signed)
Spoke with pt he had open heart surgery and his Thymus Gland  removed on 04/19/22. After surgery he has had speech problems, can't spit and has trouble swallowing, he thought it was from the [place] being removed but it hasn't gotten any better so he thought he should call

## 2022-05-20 NOTE — Telephone Encounter (Signed)
Patient needs evaluation in the office  Please use a work-in next week.  If he has worsening weakness, difficulty with speech/swallow, or new problems with shortness of breath, he needs to go to the ER for evaluation of myasthenia exacerbation.

## 2022-05-20 NOTE — Telephone Encounter (Signed)
Patient is calling in stating that he was prescribed Prednisone and underwent surgery to remove his Thymus Gland but after he has noticed issues with speech and swallowing.

## 2022-05-25 NOTE — Telephone Encounter (Signed)
Patient has canceled his follow up appointment with Dr. Posey Pronto.   Called patient and he has informed me that he is feeling much better and his symptoms have improved significantly and is almost back to normal. Patient stated that he canceled his appointment due to feeling better/symptoms improving and also his wife drives him places and she is sick. Patient stated that he will defiantly call us if symptoms return. Patient thanked our office for the call.

## 2022-05-26 ENCOUNTER — Ambulatory Visit: Payer: Medicare HMO | Admitting: Neurology

## 2022-05-28 ENCOUNTER — Ambulatory Visit: Payer: Medicare HMO | Admitting: Cardiology

## 2022-06-02 NOTE — Progress Notes (Deleted)
Cardiology Office Note:    Date:  06/02/2022   ID:  Adrian Bell, DOB 05-01-1945, MRN [id number]  PCP:  [facility]  Cardiologist:  Norman Herrlich, MD    Referring MD: [facility]    ASSESSMENT:    No diagnosis found. PLAN:    In order of problems listed above:  ***   Next appointment: ***   Medication Adjustments/Labs and Tests Ordered: Current medicines are reviewed at length with the patient today.  Concerns regarding medicines are outlined above.  No orders of the defined types were placed in this encounter.  No orders of the defined types were placed in this encounter.   No chief complaint on file.   History of Present Illness:    Adrian Bell is a 77 y.o. male with a hx of hypertension hyperlipidemia and myasthenia gravis CAD with inferior ST segment elevation MI PCI drug-eluting stent right coronary artery in May 2020 and  recent coronary artery bypass surgery 04/19/2022 for severe calcific three-vessel coronary artery disease complicated by postoperative bacteremia.  He was last seen 03/22/2022.  Echocardiogram performed 04/26/2022 showed a small pericardial effusion normal left ventricular function. Compliance with diet, lifestyle and medications: *** Past Medical History:  Diagnosis Date   Dysphagia    Heart attack (HCC) 2020   Hyperlipidemia    Kidney stones    Myasthenia gravis (HCC)    STEMI (ST elevation myocardial infarction) [facility])     Past Surgical History:  Procedure Laterality Date   BACK SURGERY     CARDIAC CATHETERIZATION     CLIPPING OF ATRIAL APPENDAGE  04/19/2022   Procedure: CLIPPING OF ATRIAL APPENDAGE USING ATRICURE ATRICLIP;  Surgeon: Lyn Hollingshead, MD;  Location: [facility] OR;  Service: Open Heart Surgery;;   COLONOSCOPY  2022   CORONARY ARTERY BYPASS GRAFT  04/19/2022   CORONARY ARTERY BYPASS GRAFT N/A 04/19/2022   Procedure: CORONARY ARTERY BYPASS GRAFTING (CABG) X3 USING LEFT INTERNAL MAMMARY ARTERY (LIMA) AND ENDOSCOPICALLY  HARVESTED RIGHT GREATER SAPHENOUS VEIN AND CORONARY ENDARTERECTOMY;  Surgeon: Lyn Hollingshead, MD;  Location: [facility] OR;  Service: Open Heart Surgery;  Laterality: N/A;   CORONARY/GRAFT ACUTE MI REVASCULARIZATION N/A 07/21/2018   Procedure: Coronary/Graft Acute MI Revascularization;  Surgeon: Corky Crafts, MD;  Location: [facility] INVASIVE CV LAB;  Service: Cardiovascular;  Laterality: N/A;   CYSTOSCOPY WITH INSERTION OF UROLIFT  2018   LASIK     LEFT HEART CATH AND CORONARY ANGIOGRAPHY N/A 07/21/2018   Procedure: LEFT HEART CATH AND CORONARY ANGIOGRAPHY;  Surgeon: Corky Crafts, MD;  Location: [facility] INVASIVE CV LAB;  Service: Cardiovascular;  Laterality: N/A;   LEFT HEART CATH AND CORONARY ANGIOGRAPHY N/A 12/08/2018   Procedure: LEFT HEART CATH AND CORONARY ANGIOGRAPHY;  Surgeon: [place], Peter M, MD;  Location: [facility] INVASIVE CV LAB;  Service: Cardiovascular;  Laterality: N/A;   LEFT HEART CATH AND CORONARY ANGIOGRAPHY N/A 04/16/2022   Procedure: LEFT HEART CATH AND CORONARY ANGIOGRAPHY;  Surgeon: Corky Crafts, MD;  Location: [facility] INVASIVE CV LAB;  Service: Cardiovascular;  Laterality: N/A;   LITHOTRIPSY     NECK SURGERY     TEE WITHOUT CARDIOVERSION N/A 04/19/2022   Procedure: TRANSESOPHAGEAL ECHOCARDIOGRAM;  Surgeon: Lyn Hollingshead, MD;  Location: [facility] OR;  Service: Open Heart Surgery;  Laterality: N/A;   THYMECTOMY N/A 04/19/2022   Procedure: THYMECTOMY;  Surgeon: Lyn Hollingshead, MD;  Location: [facility] OR;  Service: Open Heart Surgery;  Laterality: N/A;   TONSILLECTOMY  TOTAL THYMECTOMY  04/19/2022    Current Medications: No outpatient medications have been marked as taking for the 06/03/22 encounter (Appointment) with Baldo Daub, MD.     Allergies:   Zetia [ezetimibe], Demerol [meperidine hcl], and Statins   Social History   Socioeconomic History   Marital status: Married    Spouse name: Not on file   Number of children: 3   Years of education: 14   Highest education level:  Not on file  Occupational History   Occupation: retired    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  Tobacco Use   Smoking status: Never   Smokeless tobacco: Former    Types: Chew    Quit date: 2009  Vaping Use   Vaping Use: Never used  Substance and Sexual Activity   Alcohol use: Yes    Comment: occ    Drug use: No   Sexual activity: Not on file  Other Topics Concern   Not on file  Social History Narrative   Lives with wife in a one story home.  Has 3 children.     Retired Public librarian.     Education: some college.    Right handed   Drinks caffeine    Social Determinants of Health   Financial Resource Strain: Not on file  Food Insecurity: No Food Insecurity (04/21/2022)   Hunger Vital Sign    Worried About Running Out of Food in the Last Year: Never true    Ran Out of Food in the Last Year: Never true  Transportation Needs: No Transportation Needs (04/21/2022)   PRAPARE - Administrator, Civil Service (Medical): No    Lack of Transportation (Non-Medical): No  Physical Activity: Not on file  Stress: Not on file  Social Connections: Not on file     Family History: The patient's ***family history includes Deep vein thrombosis in his brother; Diabetes in his sister; Esophageal cancer in his brother; Heart attack in his father and mother; Heart disease in his mother; Stroke in his brother and sister. ROS:   Please see the history of present illness.    All other systems reviewed and are negative.  EKGs/Labs/Other Studies Reviewed:    The following studies were reviewed today:  Cardiac Studies & Procedures   CARDIAC CATHETERIZATION  CARDIAC CATHETERIZATION 04/16/2022  Narrative   Prox LAD to Mid LAD lesion is 75% stenosed.   Ost LAD to Prox LAD lesion is 75% stenosed.   Prox Cx to Mid Cx lesion is 50% stenosed.   Ost RCA to Prox RCA lesion is 70% stenosed.   Dist RCA lesion is 80% stenosed with 80% stenosed side branch in Ost RPDA.   Ost 1st Diag  lesion is 100% stenosed.   Ost 2nd Diag to 2nd Diag lesion is 100% stenosed.   Ost 1st Mrg lesion is 70% stenosed.   1st Mrg lesion is 99% stenosed.   Ost Cx to Prox Cx lesion is 70% stenosed.   Mid RCA stent is patent.   The left ventricular systolic function is normal.   LV end diastolic pressure is normal.   The left ventricular ejection fraction is 55-65% by visual estimate.   There is no aortic valve stenosis.  Severe calcific three-vessel coronary artery disease.  The change in anatomy from 2020 is the 99% lesion in the OM.  There is proximal disease in the OM at the bifurcation with the circumflex.  Will get a surgical evaluation for CABG.  His symptom  burden has been increasing of late so we will restart heparin 2 hours after the TR band has been removed.  Findings Coronary Findings Diagnostic  Dominance: Right  Left Anterior Descending There is mild diffuse disease throughout the vessel. Ost LAD to Prox LAD lesion is 75% stenosed. The lesion is eccentric. The lesion is severely calcified. Prox LAD to Mid LAD lesion is 75% stenosed.  First Diagonal Branch Ost 1st Diag lesion is 100% stenosed.  Second Diagonal [employer] Ost 2nd Diag to 2nd Diag lesion is 100% stenosed.  Left Circumflex Ost Cx to Prox Cx lesion is 70% stenosed. The lesion is severely calcified. Prox Cx to Mid Cx lesion is 50% stenosed.  First Obtuse Marginal Branch Ost 1st Mrg lesion is 70% stenosed. The lesion is severely calcified. 1st Mrg lesion is 99% stenosed.  Right Coronary Artery Ost RCA to Prox RCA lesion is 70% stenosed. The lesion is severely calcified. Non-stenotic Mid RCA lesion was previously treated. The lesion is type C. The lesion is calcified. Dist RCA lesion is 80% stenosed with 80% stenosed side branch in Ost RPDA.  Right Posterior Descending Artery There is mild disease in the vessel.  Inferior Septal There is mild disease in the vessel.  Right Posterior Atrioventricular  Artery There is mild disease in the vessel.  Intervention  No interventions have been documented.   CARDIAC CATHETERIZATION  CARDIAC CATHETERIZATION 12/08/2018  Narrative  Ost LAD to Prox LAD lesion is 75% stenosed.  Mid LAD to Dist LAD lesion is 75% stenosed.  Ost 1st Mrg lesion is 70% stenosed.  Ost RCA to Prox RCA lesion is 70% stenosed.  Previously placed Mid RCA drug eluting stent is widely patent.  Ost 1st Diag lesion is 100% stenosed.  Ost 2nd Diag to 2nd Diag lesion is 100% stenosed.  Ost Cx to Prox Cx lesion is 50% stenosed.  Prox Cx to Mid Cx lesion is 50% stenosed.  Dist RCA lesion is 80% stenosed with 80% stenosed side branch in Ost RPDA.  The left ventricular systolic function is normal.  LV end diastolic pressure is normal.  The left ventricular ejection fraction is 55-65% by visual estimate.  1. Complex 3 vessel CAD. He has severely calcified coronary arteries. - 70% ostial LAD with bulky, eccentric lesion - segmental 75% mid LAD - occluded small first and second diagonals - 50% ostial and mid LCx - 70% ostial large OM1 - 70% ostial RCA. 80% distal RCA at bifurcation 2. Normal LV function. 3. Normal LVEDP  Plan; will intensify medical therapy for angina adding Imdur 30 mg daily. If he continues to have angina I would recommend CT surgery consult for consideration of CABG  Findings Coronary Findings Diagnostic  Dominance: Right  Left Anterior Descending Ost LAD to Prox LAD lesion is 75% stenosed. The lesion is eccentric. The lesion is severely calcified. Mid LAD to Dist LAD lesion is 75% stenosed.  First Diagonal Branch Ost 1st Diag lesion is 100% stenosed.  Second Diagonal [employer] Ost 2nd Diag to 2nd Diag lesion is 100% stenosed.  Left Circumflex Ost Cx to Prox Cx lesion is 50% stenosed. The lesion is severely calcified. Prox Cx to Mid Cx lesion is 50% stenosed.  First Obtuse Marginal Branch Ost 1st Mrg lesion is 70% stenosed. The  lesion is severely calcified.  Right Coronary Artery Ost RCA to Prox RCA lesion is 70% stenosed. The lesion is severely calcified. Previously placed Mid RCA drug eluting stent is widely patent. The lesion is type C.  The lesion is calcified. Dist RCA lesion is 80% stenosed with 80% stenosed side branch in Ost RPDA.  Right Posterior Descending Artery There is mild disease in the vessel.  Inferior Septal There is mild disease in the vessel.  Right Posterior Atrioventricular Artery There is mild disease in the vessel.  Intervention  No interventions have been documented.   STRESS TESTS  MYOCARDIAL PERFUSION IMAGING 04/22/2020  Narrative  The left ventricular ejection fraction is hyperdynamic (>65%).  Nuclear stress EF: 67%.  There was no ST segment deviation noted during stress.  No T wave inversion was noted during stress.  Defect 1: There is a small fixed defect of mild severity present in the apex location, with normal wall motion.  The study is normal. The small fixed defect as described above is likely a soft tissue attenuation.  This is a low risk study.   ECHOCARDIOGRAM  ECHOCARDIOGRAM COMPLETE BUBBLE STUDY 04/26/2022  Narrative ECHOCARDIOGRAM REPORT    Patient Name:   Adrian Bell Date of Exam: 04/26/2022 Medical Rec #:  [id number]  Height:       66.0 in Accession #:    [id number] Weight:       179.9 lb Date of Birth:  16-Jan-1946   BSA:          1.912 m Patient Age:    76 years   BP:           131/73 mmHg Patient Gender: M          HR:           65 bpm. Exam Location:  Inpatient  Procedure: 2D Echo, Saline Contrast Bubble Study and Intracardiac Opacification Agent  Indications:    pericardial effussion  History:        Patient has prior history of Echocardiogram examinations, most recent 03/23/2022. Acute MI and CAD, Signs/Symptoms:Chest Pain; Risk Factors:Hypertension, Dyslipidemia and Current Smoker.  Sonographer:    Cathie Hoops Referring Phys: Lowella Dandy, R   Sonographer Comments: Technically difficult study due to poor echo windows, patient is obese and no subcostal window. Image acquisition challenging due to patient body habitus and supine and surgocal incisions. IMPRESSIONS   1. Left ventricular ejection fraction, by estimation, is 65 to 70%. The left ventricle has normal function. The left ventricle has no regional wall motion abnormalities. 2. Right ventricular systolic function was not well visualized. The right ventricular size is normal. There is normal pulmonary artery systolic pressure. 3. A small pericardial effusion is present. 4. The mitral valve is normal in structure. Trivial mitral valve regurgitation. 5. The aortic valve is tricuspid. Aortic valve regurgitation is not visualized. Aortic valve sclerosis/calcification is present, without any evidence of aortic stenosis. 6. Agitated saline contrast bubble study was negative, with no evidence of any interatrial shunt.  Comparison(s): The left ventricular function is unchanged.  FINDINGS Left Ventricle: Left ventricular ejection fraction, by estimation, is 65 to 70%. The left ventricle has normal function. The left ventricle has no regional wall motion abnormalities. Definity contrast agent was given IV to delineate the left ventricular endocardial borders. The left ventricular internal cavity size was normal in size. There is no left ventricular hypertrophy.  Right Ventricle: The right ventricular size is normal. Right vetricular wall thickness was not assessed. Right ventricular systolic function was not well visualized. There is normal pulmonary artery systolic pressure. The tricuspid regurgitant velocity is 1.75 m/s, and with an assumed right atrial pressure of 3 mmHg, the estimated right ventricular systolic pressure is  15.2 mmHg.  Left Atrium: Left atrial size was normal in size.  Right Atrium: Right atrial size was normal in size.  Pericardium: A small pericardial  effusion is present.  Mitral Valve: The mitral valve is normal in structure. Trivial mitral valve regurgitation.  Tricuspid Valve: The tricuspid valve is normal in structure. Tricuspid valve regurgitation is trivial.  Aortic Valve: The aortic valve is tricuspid. Aortic valve regurgitation is not visualized. Aortic valve sclerosis/calcification is present, without any evidence of aortic stenosis. Aortic valve mean gradient measures 3.7 mmHg. Aortic valve peak gradient measures 7.2 mmHg. Aortic valve area, by VTI measures 2.22 cm.  Pulmonic Valve: The pulmonic valve was not well visualized. Pulmonic valve regurgitation is not visualized. No evidence of pulmonic stenosis.  Aorta: The aortic root is normal in size and structure.  IAS/Shunts: No atrial level shunt detected by color flow Doppler. Agitated saline contrast was given intravenously to evaluate for intracardiac shunting. Agitated saline contrast bubble study was negative, with no evidence of any interatrial shunt.   LEFT VENTRICLE PLAX 2D LVIDd:         5.20 cm      Diastology LVIDs:         3.50 cm      LV e' medial:    5.77 cm/s LV PW:         1.00 cm      LV E/e' medial:  13.9 LV IVS:        0.80 cm      LV e' lateral:   6.31 cm/s LVOT diam:     1.90 cm      LV E/e' lateral: 12.7 LV SV:         56 LV SV Index:   30 LVOT Area:     2.84 cm  LV Volumes (MOD) LV vol d, MOD A2C: 121.0 ml LV vol d, MOD A4C: 140.0 ml LV vol s, MOD A2C: 42.9 ml LV vol s, MOD A4C: 62.6 ml LV SV MOD A2C:     78.1 ml LV SV MOD A4C:     140.0 ml LV SV MOD BP:      81.7 ml  RIGHT VENTRICLE RV Basal diam:  3.30 cm RV Mid diam:    3.50 cm RV S prime:     7.94 cm/s TAPSE (M-mode): 1.0 cm  LEFT ATRIUM             Index        RIGHT ATRIUM           Index LA diam:        3.30 cm 1.73 cm/m   RA Area:     15.40 cm LA Vol (A2C):   34.8 ml 18.20 ml/m  RA Volume:   40.40 ml  21.13 ml/m LA Vol (A4C):   28.2 ml 14.75 ml/m LA Biplane Vol: 31.9 ml  16.69 ml/m AORTIC VALVE                    PULMONIC VALVE AV Area (Vmax):    2.05 cm     PV Vmax:       1.08 m/s AV Area (Vmean):   2.11 cm     PV Peak grad:  4.7 mmHg AV Area (VTI):     2.22 cm AV Vmax:           134.00 cm/s AV Vmean:          87.467 cm/s AV VTI:  0.254 m AV Peak Grad:      7.2 mmHg AV Mean Grad:      3.7 mmHg LVOT Vmax:         96.70 cm/s LVOT Vmean:        65.133 cm/s LVOT VTI:          0.199 m LVOT/AV VTI ratio: 0.78  AORTA Ao Root diam: 3.10 cm  MITRAL VALVE               TRICUSPID VALVE MV Area (PHT): 4.89 cm    TR Peak grad:   12.2 mmHg MV Decel Time: 155 msec    TR Vmax:        175.00 cm/s MV E velocity: 80.00 cm/s MV A velocity: 58.30 cm/s  SHUNTS MV E/A ratio:  1.37        Systemic VTI:  0.20 m Systemic Diam: 1.90 cm  Dietrich Pates MD Electronically signed by Dietrich Pates MD Signature Date/Time: 04/26/2022/5:59:00 PM    Final   TEE  ECHO INTRAOPERATIVE TEE 04/19/2022  Narrative *INTRAOPERATIVE TRANSESOPHAGEAL REPORT *    Patient Name:   Adrian Bell     Date of Exam: 04/19/2022 Medical Rec #:  [id number]      Height:       66.0 in Accession #:    [id number]     Weight:       176.7 lb Date of Birth:  1945-11-12       BSA:          1.90 m Patient Age:    76 years       BP:           144/72 mmHg Patient Gender: M              HR:           56 bpm. Exam Location:  Anesthesiology  Transesophogeal exam was perform intraoperatively during surgical procedure. Patient was closely monitored under general anesthesia during the entirety of examination.  Indications:     I25.110 Atherosclerotic heart disease of native coronary artery with unstable angina pectoris Performing Phys: Leslye Peer MD Diagnosing Phys: Leslye Peer MD  Complications: No known complications during this procedure. POST-OP IMPRESSIONS _ Left Ventricle: The left ventricle is unchanged from pre-bypass. _ Right Ventricle: The right ventricle appears unchanged from  pre-bypass. _ Aorta: The aorta appears unchanged from pre-bypass. _ Left Atrium: The left atrium appears unchanged from pre-bypass. _ Left Atrial Appendage: The left atrial appendage appears unchanged from pre-bypass. _ Aortic Valve: The aortic valve appears unchanged from pre-bypass. _ Mitral Valve: The mitral valve appears unchanged from pre-bypass. _ Tricuspid Valve: The tricuspid valve appears unchanged from pre-bypass. _ Pulmonic Valve: The pulmonic valve appears unchanged from pre-bypass. _ Interatrial Septum: The interatrial septum appears unchanged from pre-bypass. _ Interventricular Septum: The interventricular septum appears unchanged from pre-bypass. _ Pericardium: The pericardium appears unchanged from pre-bypass.  PRE-OP FINDINGS Left Ventricle: The left ventricle has normal systolic function, with an ejection fraction of 55-60%. The cavity size was normal. No evidence of left ventricular regional wall motion abnormalities. There is no left ventricular hypertrophy.   Right Ventricle: The right ventricle has normal systolic function. The cavity was normal. There is no increase in right ventricular wall thickness.  Left Atrium: Left atrial size was normal in size.  Right Atrium: Right atrial size was normal in size.  Interatrial Septum: No atrial level shunt detected by color flow Doppler.  Pericardium: There is  no evidence of pericardial effusion.  Mitral Valve: The mitral valve is normal in structure. Mitral valve regurgitation is not visualized by color flow Doppler. There is No evidence of mitral stenosis.  Tricuspid Valve: The tricuspid valve was normal in structure. Tricuspid valve regurgitation was not visualized by color flow Doppler. No evidence of tricuspid stenosis is present.  Aortic Valve: The aortic valve is tricuspid Aortic valve regurgitation was not visualized by color flow Doppler. There is no stenosis of the aortic valve.   Pulmonic Valve: The pulmonic  valve was not assessed. Pulmonic valve regurgitation was not assessed by color flow Doppler.   Aorta: The aortic root, ascending aorta and aortic arch are normal in size and structure.   Leslye Peer MD Electronically signed by Leslye Peer MD Signature Date/Time: 04/19/2022/2:47:16 PM    Final            EKG:  EKG ordered today and personally reviewed.  The ekg ordered today demonstrates ***  Recent Labs: 11/25/2021: TSH 2.32 03/22/2022: NT-Pro BNP 71 04/18/2022: ALT 14 04/20/2022: Magnesium 2.0 04/30/2022: BUN 17; Creatinine, Ser 0.88; Hemoglobin 7.3; Platelets 266; Potassium 4.3; Sodium 135  Recent Lipid Panel    Component Value Date/Time   CHOL 216 (H) 11/10/2021 0859   TRIG 202 (H) 11/10/2021 0859   HDL 58 11/10/2021 0859   CHOLHDL 3.7 11/10/2021 0859   CHOLHDL 3.1 07/21/2018 2254   VLDL 25 07/21/2018 2254   LDLCALC 123 (H) 11/10/2021 0859    Physical Exam:    VS:  There were no vitals taken for this visit.    Wt Readings from Last 3 Encounters:  05/20/22 172 lb (78 kg)  05/04/22 170 lb (77.1 kg)  04/30/22 179 lb 8 oz (81.4 kg)     GEN: *** Well nourished, well developed in no acute distress HEENT: Normal NECK: No JVD; No carotid bruits LYMPHATICS: No lymphadenopathy CARDIAC: ***RRR, no murmurs, rubs, gallops RESPIRATORY:  Clear to auscultation without rales, wheezing or rhonchi  ABDOMEN: Soft, non-tender, non-distended MUSCULOSKELETAL:  No edema; No deformity  SKIN: Warm and dry NEUROLOGIC:  Alert and oriented x 3 PSYCHIATRIC:  Normal affect    Signed, Norman Herrlich, MD  06/02/2022 7:53 PM    [facility]

## 2022-06-03 ENCOUNTER — Ambulatory Visit: Payer: Medicare HMO | Admitting: Cardiology

## 2022-06-04 DIAGNOSIS — R509 Fever, unspecified: Secondary | ICD-10-CM | POA: Diagnosis not present

## 2022-06-04 DIAGNOSIS — R519 Headache, unspecified: Secondary | ICD-10-CM | POA: Diagnosis not present

## 2022-06-04 DIAGNOSIS — R0981 Nasal congestion: Secondary | ICD-10-CM | POA: Diagnosis not present

## 2022-06-08 NOTE — Progress Notes (Unsigned)
[address]       [place] [address]             [phone]    HPI:  Adrian Bell is S/P CABG x 3 with Endarterectomy and Clipping of LA Appendage performed by Dr. Delia Chimes on 04/19/2022.  His hospital course was complicated with infection with Hafnia alvei bacteremia, with unclear source.  He has since completed his course of Bactrim DS as recommended by ID.  He has been seen in our office on 2 previous occasions both of which the patient was doing well overall.  He biggest complaint at that time was shortness of breath.  CXR has shown minimal left pleural effusion.  He presents today for 3 week follow up at the request of Dr. Delia Chimes.  Overall the patient is doing much better.  He states he is no longer having issues with fluid.  He is up to walking in 15 min increments.  He does state he has been experiencing issues with Myasthenia Gravis.  He thought be his Thymus was removed he would not have trouble with this.  I explained to patient that if not all the thymic tissue was removed he could still experience symptoms.  He is being closely followed by his Neurologist.  He is not interested in participating in cardiac rehab.    Current Outpatient Medications  Medication Sig Dispense Refill   furosemide (LASIX) 20 MG tablet Take 1 tablet (20 mg total) by mouth daily for 14 days. 14 tablet 0   acetaminophen (TYLENOL) 325 MG tablet Take 2 tablets (650 mg total) by mouth every 4 (four) hours as needed for fever or mild pain.     alum & mag hydroxide-simeth (MINTOX) 200-200-20 MG/5ML suspension Take 30 mLs by mouth as needed for indigestion or heartburn.     amLODipine (NORVASC) 5 MG tablet Take 1 tablet (5 mg total) by mouth daily. Please take while using Bactrim DS (antibiotic) then discontinue and resume Altace 24 tablet 0   aspirin 81 MG chewable tablet Chew 1 tablet (81 mg total) by mouth daily. 90 tablet 1   Calcium Carbonate-Vit D-Min (CALCIUM 1200 PO) Take 1 tablet by mouth  daily.     clopidogrel (PLAVIX) 75 MG tablet Take 1 tablet (75 mg total) by mouth daily. 30 tablet 3   Cyanocobalamin (VITAMIN B-12 PO) Take 1 tablet by mouth daily.     ferrous gluconate (FERGON) 324 MG tablet Take 1 tablet by mouth 3 (three) times a week. Monday, Wednesday AND Friday     metoprolol tartrate (LOPRESSOR) 25 MG tablet Take 1 tablet (25 mg total) by mouth 2 (two) times daily. 60 tablet 3   nitroGLYCERIN (NITROSTAT) 0.4 MG SL tablet Place 1 tablet (0.4 mg total) under the tongue every 5 (five) minutes as needed for chest pain. 25 tablet 1   Pitavastatin Calcium 2 MG TABS Take 1 tablet (2 mg total) by mouth daily. (Patient taking differently: Take 2 mg by mouth See admin instructions. Take 2 mg (1 tablet) by mouth on Monday's and Friday's.) 90 tablet 3   potassium chloride SA (KLOR-CON M) 20 MEQ tablet Take 1 tablet (20 mEq total) by mouth daily. (Patient not taking: Reported on 05/20/2022) 7 tablet 0   predniSONE (DELTASONE) 10 MG tablet Take 1 tablet (10 mg total) by mouth daily with breakfast. 90 tablet 3   sulfamethoxazole-trimethoprim (BACTRIM DS) 800-160 MG tablet Take 2 tablets by mouth every 12 (twelve) hours. (  Patient not taking: Reported on 05/20/2022) 30 tablet 0   traMADol (ULTRAM) 50 MG tablet Take 1 tablet (50 mg total) by mouth every 6 (six) hours as needed for moderate pain. 30 tablet 0   No current facility-administered medications for this visit.    Physical Exam:  BP (!) 170/89 (BP Location: Right Arm, Patient Position: Sitting, Cuff Size: Normal)   Pulse 70   Resp 20   Ht  (1.676 m)   Wt 169 lb (76.7 kg)   SpO2 96% Comment: RA  BMI 27.28 kg/m   Gen: NAD Heart: RRR Lungs: CTA bilaterally Ext: no edema Incisions: well healed  Diagnostic Tests: None    A/P:  S/p CABG x 3, Coronary Endarterectomy, and Clipping of LA Appendage- overall doing well,  HTN- patient has stopped his Imdur and Norvasc.. he was instructed to resume his pre operative dose  of Ramipril however, I told patient he may require further increase in this dose Hafnia alvei bacteremia- completed recommended course of Bactrim per ID recommendations, remains afebrile w/o evidence of infections Myasthenia Gravis- H/o Thymectomy, experiencing symptoms.. Neurology following closely Cardiac rehabilitation, he doesn't wish to participate RTC prn  Lowella Dandy, PA-C [facility] [phone]

## 2022-06-08 NOTE — Patient Instructions (Signed)
Enroll/continue Cardiac Rehabilitation  Make every effort to maintain a "heart-healthy" lifestyle with regular physical exercise and adherence to a low-fat, low-carbohydrate diet.  Continue to seek regular follow-up appointments with your primary care physician and/or cardiologist.   You may continue to gradually increase your physical activity as tolerated.  Refrain from any heavy lifting or strenuous use of your arms and shoulders until at least 8 weeks from the time of your surgery, and avoid activities that cause increased pain in your chest on the side of your surgical incision.  Otherwise you may continue to increase activities without any particular limitations.  Increase the intensity and duration of physical activity gradually.

## 2022-06-09 ENCOUNTER — Telehealth: Payer: Self-pay | Admitting: Anesthesiology

## 2022-06-09 NOTE — Telephone Encounter (Signed)
Ok to increase prednisone to 20mg  daily and follow-up in the office next Wednesday.   If he has worsening weakness, difficulty with speech/swallow, or new problems with shortness of breath, he needs to go to the ER for evaluation of myasthenia exacerbation.

## 2022-06-09 NOTE — Telephone Encounter (Signed)
Called patient and informed him of Dr. Eliane Decree recommendations.Ok to increase prednisone to 20mg  daily and follow-up in the office next Wednesday.  Informed patient that if he has worsening weakness, difficulty with speech/swallow, or new problems with shortness of breath, he needs to go to the ER for evaluation of myasthenia exacerbation. Patient verbalized understanding and had stated he has enough Prednisone to last him. Patient is aware someone from our front office will give him a call to get him scheduled for his f/u next week with Dr. Allena Katz.

## 2022-06-09 NOTE — Telephone Encounter (Signed)
Pt called stating his sx's of myasthenia gravis have increased. States he would like to know if he can increase his prednisone dosage.

## 2022-06-10 ENCOUNTER — Ambulatory Visit (INDEPENDENT_AMBULATORY_CARE_PROVIDER_SITE_OTHER): Payer: Self-pay | Admitting: Physician Assistant

## 2022-06-10 VITALS — BP 170/89 | HR 70 | Resp 20 | Ht 66.0 in | Wt 169.0 lb

## 2022-06-10 DIAGNOSIS — M25562 Pain in left knee: Secondary | ICD-10-CM | POA: Insufficient documentation

## 2022-06-10 DIAGNOSIS — E669 Obesity, unspecified: Secondary | ICD-10-CM | POA: Insufficient documentation

## 2022-06-10 DIAGNOSIS — L570 Actinic keratosis: Secondary | ICD-10-CM | POA: Insufficient documentation

## 2022-06-10 DIAGNOSIS — Z951 Presence of aortocoronary bypass graft: Secondary | ICD-10-CM

## 2022-06-10 DIAGNOSIS — G629 Polyneuropathy, unspecified: Secondary | ICD-10-CM | POA: Insufficient documentation

## 2022-06-10 DIAGNOSIS — Z77098 Contact with and (suspected) exposure to other hazardous, chiefly nonmedicinal, chemicals: Secondary | ICD-10-CM

## 2022-06-10 DIAGNOSIS — M1990 Unspecified osteoarthritis, unspecified site: Secondary | ICD-10-CM

## 2022-06-10 DIAGNOSIS — M1712 Unilateral primary osteoarthritis, left knee: Secondary | ICD-10-CM

## 2022-06-10 DIAGNOSIS — H40013 Open angle with borderline findings, low risk, bilateral: Secondary | ICD-10-CM

## 2022-06-10 DIAGNOSIS — K219 Gastro-esophageal reflux disease without esophagitis: Secondary | ICD-10-CM

## 2022-06-10 DIAGNOSIS — N209 Urinary calculus, unspecified: Secondary | ICD-10-CM | POA: Insufficient documentation

## 2022-06-10 DIAGNOSIS — D509 Iron deficiency anemia, unspecified: Secondary | ICD-10-CM

## 2022-06-10 HISTORY — DX: Gastro-esophageal reflux disease without esophagitis: K21.9

## 2022-06-10 HISTORY — DX: Iron deficiency anemia, unspecified: D50.9

## 2022-06-10 HISTORY — DX: Unspecified osteoarthritis, unspecified site: M19.90

## 2022-06-10 HISTORY — DX: Obesity, unspecified: E66.9

## 2022-06-10 HISTORY — DX: Pain in left knee: M25.562

## 2022-06-10 HISTORY — DX: Open angle with borderline findings, low risk, bilateral: H40.013

## 2022-06-10 HISTORY — DX: Polyneuropathy, unspecified: G62.9

## 2022-06-10 HISTORY — DX: Contact with and (suspected) exposure to other hazardous, chiefly nonmedicinal, chemicals: Z77.098

## 2022-06-10 HISTORY — DX: Urinary calculus, unspecified: N20.9

## 2022-06-10 HISTORY — DX: Actinic keratosis: L57.0

## 2022-06-10 HISTORY — DX: Unilateral primary osteoarthritis, left knee: M17.12

## 2022-06-16 ENCOUNTER — Encounter: Payer: Self-pay | Admitting: Neurology

## 2022-06-16 ENCOUNTER — Ambulatory Visit: Payer: Medicare HMO | Admitting: Neurology

## 2022-06-16 VITALS — BP 148/76 | HR 60 | Ht 66.0 in | Wt 170.0 lb

## 2022-06-16 DIAGNOSIS — G629 Polyneuropathy, unspecified: Secondary | ICD-10-CM | POA: Diagnosis not present

## 2022-06-16 DIAGNOSIS — G7001 Myasthenia gravis with (acute) exacerbation: Secondary | ICD-10-CM | POA: Diagnosis not present

## 2022-06-16 NOTE — Patient Instructions (Addendum)
Continue prednisone  for another week.  If there is no improvement, you may increase prednisone to  daily.  Return to clinic in May

## 2022-06-16 NOTE — Progress Notes (Signed)
Follow-up Visit   Date: 06/16/22    Adrian Bell MRN: [id number] DOB: [birth year]   Interim History: Adrian Bell is a 77 y.o. right-handed Caucasian male with hypertension, kidney stones, and iron deficiency anemia returning to the clinic for follow-up of seropositive myasthenia gravis and neuropathy.   The patient was accompanied to the clinic by self.  IMPRESSION/PLAN: Seropositive bulbar and generalized myasthenia gravis with exacerbation, diagnosed 2019, s/p thymus resection (2024). He was doing very well on prednisone  until his CABG in February after which he has dysphagia, dysarthria, and weakness.  MG history:  - Highest dose of prednisone is /d in 2019   - Tapering prednisone <  caused breakthrough weakness, so he has been on /d since early 2022   - January 2023 - new onset of bulbar weakness, prednisone titrated to  daily  - April 2023 - IVIG started due to bulbar symptoms   - June 2023 - IVIG adjusted to every 3 weeks, clinically improved  - August 2023 - started prednisone taper   - November 2023 - IVIG stopped  - Jan - March 2024 - prednisone  daily  - Feb 2024  - s/p CABG and thymus resection, MG exacerbation  - April 2024 - prednisone increased to /d   PLAN:  Continue prednisone  daily for one more week.  Patient to call with update next week, if symptoms are unchanged increase to  daily.  If slightly better, we can slowly titrate to /d and if they are improving, then remain on /d.  2. Peripheral neuropathy with distal paresthesias and sensory ataxia, stable  Return to clinic in 3-4 weeks  ------------------------------------------------------------------------------  UPDATE 03/17/2022:  He is here for follow-up visit.  He is doing great with respect to MG and denies any double vision, droopiness of the eyelids, difficulty swallowing/talking, or limb weakness.  He has been able to taper prednisone down to  daily and  remains off IVIG.  He has been having shortness of breath with exertion and some chest pressure, which has limited his activity level.  He is no longer doing yard work.   He has not scheduled an appointment with cardiology, but plans to do this.  Neuropathy is unchanged and he has noticed mild increased in unsteadiness/imbalance.  No falls.  He continues to walk independently.   UPDATE 06/16/2022: He was doing well with MG until February.  He underwent 3v CABG in February 2024.   Immediately post-op, he began having difficulty with swallowing, speech, spitting, and arm/leg weakness.  He was having difficulty moving his food in his mouth, initially having to manually use his fingers to get food out of the corners of his cheek.  His prednisone was increased to  daily last week and he has noticed mild improvement.  His swallowing and speech is better.   He continues to have mild slurred speech and bilateral leg weakness.  He denies shortness of breath and is able to lay on one pillow.     Medications:  Current Outpatient Medications on File Prior to Visit  Medication Sig Dispense Refill   acetaminophen (TYLENOL) 325 MG tablet Take 2 tablets (650 mg total) by mouth every 4 (four) hours as needed for fever or mild pain.     alum & mag hydroxide-simeth (MINTOX) 200-200-20 MG/5ML suspension Take 30 mLs by mouth as needed for indigestion or heartburn.     aspirin 81 MG chewable tablet Chew 1 tablet (81 mg total) by mouth daily.  90 tablet 1   Calcium Carbonate-Vit D-Min (CALCIUM 1200 PO) Take 1 tablet by mouth daily.     clopidogrel (PLAVIX) 75 MG tablet Take 1 tablet (75 mg total) by mouth daily. 30 tablet 3   ferrous gluconate (FERGON) 324 MG tablet Take 1 tablet by mouth 3 (three) times a week. Monday, Wednesday AND Friday     metoprolol tartrate (LOPRESSOR) 25 MG tablet Take 1 tablet (25 mg total) by mouth 2 (two) times daily. 60 tablet 3   nitroGLYCERIN (NITROSTAT) 0.4 MG SL tablet Place 1 tablet (0.4  mg total) under the tongue every 5 (five) minutes as needed for chest pain. 25 tablet 1   Pitavastatin Calcium 2 MG TABS Take 1 tablet (2 mg total) by mouth daily. (Patient taking differently: Take 2 mg by mouth See admin instructions. Take 2 mg (1 tablet) by mouth on Monday's and Friday's.) 90 tablet 3   predniSONE (DELTASONE) 10 MG tablet Take 1 tablet (10 mg total) by mouth daily with breakfast. 90 tablet 3   ramipril (ALTACE) 5 MG capsule Take 5 mg by mouth daily.     traMADol (ULTRAM) 50 MG tablet Take 1 tablet (50 mg total) by mouth every 6 (six) hours as needed for moderate pain. 30 tablet 0   No current facility-administered medications on file prior to visit.    Allergies:  Allergies  Allergen Reactions   Zetia [Ezetimibe] Other (See Comments)    Weakness, shortness of breath   Demerol [Meperidine Hcl]    Statins Other (See Comments)    myalgias    Vital Signs:  BP (!) 148/76   Pulse 60   Ht  (1.676 m)   Wt 170 lb (77.1 kg)   SpO2 99%   BMI 27.44 kg/m   Neurological Exam: MENTAL STATUS including orientation to time, place, person, recent and remote memory, attention span and concentration, language, and fund of knowledge is normal.  There is mild-moderate dysarthria.  CRANIAL NERVES:  Pupils equal round and reactive to light.  Normal conjugate, extra-ocular eye movements in all directions of gaze. Trace left ptosis.  Face is symmetric.  Facial muscles are 5/5 with orbicularis oculi and buccinator testing.   Palate elevates symmetrically.  Tongue is midline with 5/5 strength, side-to-side movements are mildly slow   MOTOR:  Motor strength is 5-/5 in the legs bilaterally and 5/5 in the arms and with neck flexion. No fatigability.  COORDINATION/GAIT:   Gait narrow based and stable. He is able to stand up without using arms to push off.   Data: CT head 12/08/2016:  Unremarkable  Labs 04/20/2017:   AChR binding 9.20*, blocking 76*, and modulating 90*  CT chest wo  contrast 05/04/2017:   1. Unremarkable CT examination the chest. No anterior mediastinal mass/thymoma is identified. 2. No acute pulmonary findings or worrisome pulmonary lesions. 3. Significant three-vessel coronary artery calcifications. 4. Cholelithiasis.  Labs received 06/29/2017:  TPMT  13 (normal).  Lab Results  Component Value Date   VITAMINB12 259 04/16/2022    Total time spent reviewing records, interview, history/exam, documentation, and coordination of care on day of encounter:  30 min    Thank you for allowing me to participate in patient's care.  If I can answer any additional questions, I would be pleased to do so.    Sincerely,    Jazira Maloney K. Allena Katz, DO

## 2022-06-20 NOTE — Progress Notes (Unsigned)
Cardiology Office Note:    Date:  06/21/2022   ID:  Adrian Bell, DOB Feb 08, 1946, MRN [id number]  PCP:  Corky Crafts, MD  Cardiologist:  Norman Herrlich, MD    Referring MD: [facility]    ASSESSMENT:    1. Coronary artery disease involving native coronary artery of native heart with unstable angina pectoris (HCC)   2. Hx of CABG   3. Essential hypertension   4. Mixed hyperlipidemia   5. Myasthenia gravis (HCC)    PLAN:    In order of problems listed above:  Overall is doing quite well after bypass surgery his last hemoglobin was 7.3 does not look anemic we will recheck labs today in my office continue medical therapy including combined aspirin and clopidogrel reduce beta-blocker to once daily resting bradycardia pravastatin. With his myasthenia he does not feel he can participate in cardiac rehab and continue his home walking program he is steadily improving Well-controlled continue his ACE inhibitor Lipids followed at [facility] for a statin intolerant   Next appointment: 6 months   Medication Adjustments/Labs and Tests Ordered: Current medicines are reviewed at length with the patient today.  Concerns regarding medicines are outlined above.  No orders of the defined types were placed in this encounter.  No orders of the defined types were placed in this encounter.   Chief complaint follow-up CAD   History of Present Illness:    Adrian Bell is a 77 y.o. male with a hx of CAD with recent CABG and left atrial appendage closure along with thymectomy 04/19/2022 hypertension hyperlipidemia and myasthenia gravis last seen 03/22/2022.  Following the visit he had an echocardiogram reported 03/23/2022 showing left ventricular ejection fraction normal 60 to 65% normal right ventricular size function and no valvular abnormality.  Compliance with diet, lifestyle and medications: Yes  Overall he is doing better walking every day physical activity he does not have edema  shortness of breath chest pain palpitation or syncope Lipids are being followed with his primary care physician at the [facility].   Past Medical History:  Diagnosis Date   Accidental exposure to phenoxyacid derivative herbicide 06/10/2022   May 18, 2017 Entered By: Alain Marion Comment: AGENT ORANGE EXPOSURE IN [place]   Actinic keratosis 06/10/2022   Acute MI, inferior wall (HCC) 07/21/2018   Angina pectoris (HCC) 12/08/2018   Arthritis 06/10/2022   CAD in native artery 08/01/2018   Dysphagia    Essential (primary) hypertension 07/24/2018   Gastro-esophageal reflux disease without esophagitis 06/10/2022   Heart attack (HCC) 2020   Hyperlipidemia    Iron deficiency anemia, unspecified 06/10/2022   Kidney stones    Long-term corticosteroid use 03/08/2018   Myasthenia gravis (HCC)    Myasthenia gravis without (acute) exacerbation (HCC) 05/03/2017   Neuropathy 06/10/2022   NSTEMI (non-ST elevated myocardial infarction) (HCC) 04/15/2022   Obesity 06/10/2022   Open angle with borderline findings, low risk, bilateral 06/10/2022   Pain in left knee 06/10/2022   S/P CABG x 3 04/19/2022   S/P thymectomy 04/19/2022   STEMI (ST elevation myocardial infarction) (HCC)    Unilateral primary osteoarthritis, left knee 06/10/2022   Urinary calculus 06/10/2022    Past Surgical History:  Procedure Laterality Date   BACK SURGERY     CARDIAC CATHETERIZATION     CLIPPING OF ATRIAL APPENDAGE  04/19/2022   Procedure: CLIPPING OF ATRIAL APPENDAGE USING ATRICURE ATRICLIP;  Surgeon: Lyn Hollingshead, MD;  Location: [facility] OR;  Service: Open Heart Surgery;;  COLONOSCOPY  2022   CORONARY ARTERY BYPASS GRAFT  04/19/2022   CORONARY ARTERY BYPASS GRAFT N/A 04/19/2022   Procedure: CORONARY ARTERY BYPASS GRAFTING (CABG) X3 USING LEFT INTERNAL MAMMARY ARTERY (LIMA) AND ENDOSCOPICALLY HARVESTED RIGHT GREATER SAPHENOUS VEIN AND CORONARY ENDARTERECTOMY;  Surgeon: Lyn Hollingshead, MD;  Location: [facility] OR;   Service: Open Heart Surgery;  Laterality: N/A;   CORONARY/GRAFT ACUTE MI REVASCULARIZATION N/A 07/21/2018   Procedure: Coronary/Graft Acute MI Revascularization;  Surgeon: Corky Crafts, MD;  Location: [facility] INVASIVE CV LAB;  Service: Cardiovascular;  Laterality: N/A;   CYSTOSCOPY WITH INSERTION OF UROLIFT  2018   LASIK     LEFT HEART CATH AND CORONARY ANGIOGRAPHY N/A 07/21/2018   Procedure: LEFT HEART CATH AND CORONARY ANGIOGRAPHY;  Surgeon: Corky Crafts, MD;  Location: [facility] INVASIVE CV LAB;  Service: Cardiovascular;  Laterality: N/A;   LEFT HEART CATH AND CORONARY ANGIOGRAPHY N/A 12/08/2018   Procedure: LEFT HEART CATH AND CORONARY ANGIOGRAPHY;  Surgeon: [place], Peter M, MD;  Location: [facility] INVASIVE CV LAB;  Service: Cardiovascular;  Laterality: N/A;   LEFT HEART CATH AND CORONARY ANGIOGRAPHY N/A 04/16/2022   Procedure: LEFT HEART CATH AND CORONARY ANGIOGRAPHY;  Surgeon: Corky Crafts, MD;  Location: [facility] INVASIVE CV LAB;  Service: Cardiovascular;  Laterality: N/A;   LITHOTRIPSY     NECK SURGERY     TEE WITHOUT CARDIOVERSION N/A 04/19/2022   Procedure: TRANSESOPHAGEAL ECHOCARDIOGRAM;  Surgeon: Lyn Hollingshead, MD;  Location: [facility] OR;  Service: Open Heart Surgery;  Laterality: N/A;   THYMECTOMY N/A 04/19/2022   Procedure: THYMECTOMY;  Surgeon: Lyn Hollingshead, MD;  Location: [facility] OR;  Service: Open Heart Surgery;  Laterality: N/A;   TONSILLECTOMY     TOTAL THYMECTOMY  04/19/2022   triple bypass  04/19/2022    Current Medications: Current Meds  Medication Sig   acetaminophen (TYLENOL) 325 MG tablet Take 2 tablets (650 mg total) by mouth every 4 (four) hours as needed for fever or mild pain.   alum & mag hydroxide-simeth (MINTOX) 200-200-20 MG/5ML suspension Take 30 mLs by mouth as needed for indigestion or heartburn.   aspirin 81 MG chewable tablet Chew 1 tablet (81 mg total) by mouth daily.   Calcium Carb-Cholecalciferol (CALCIUM 600 + D PO) Take 1 tablet by mouth daily.    Calcium Carbonate-Vit D-Min (CALCIUM 1200 PO) Take 1 tablet by mouth daily.   clopidogrel (PLAVIX) 75 MG tablet Take 1 tablet (75 mg total) by mouth daily.   cyanocobalamin (VITAMIN B12) 1000 MCG tablet Take 1,000 mcg by mouth daily.   ferrous gluconate (FERGON) 324 MG tablet Take 1 tablet by mouth 3 (three) times a week. Monday, Wednesday AND Friday   metoprolol tartrate (LOPRESSOR) 25 MG tablet Take 1 tablet (25 mg total) by mouth 2 (two) times daily.   nitroGLYCERIN (NITROSTAT) 0.4 MG SL tablet Place 1 tablet (0.4 mg total) under the tongue every 5 (five) minutes as needed for chest pain.   Pitavastatin Calcium (LIVALO) 2 MG TABS Take 2 mg by mouth 2 (two) times a week.   predniSONE (DELTASONE) 10 MG tablet Take 1 tablet (10 mg total) by mouth daily with breakfast.   ramipril (ALTACE) 5 MG capsule Take 5 mg by mouth daily.   traMADol (ULTRAM) 50 MG tablet Take 1 tablet (50 mg total) by mouth every 6 (six) hours as needed for moderate pain.     Allergies:   Zetia [ezetimibe], Demerol [meperidine hcl], and Statins   Social History  Socioeconomic History   Marital status: Married    Spouse name: Not on file   Number of children: 3   Years of education: 14   Highest education level: Not on file  Occupational History   Occupation: retired    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  Tobacco Use   Smoking status: Never   Smokeless tobacco: Former    Types: Chew    Quit date: 2009  Vaping Use   Vaping Use: Never used  Substance and Sexual Activity   Alcohol use: Yes    Comment: occ    Drug use: No   Sexual activity: Not on file  Other Topics Concern   Not on file  Social History Narrative   Lives with wife in a one story home.  Has 3 children.     Retired Public librarian.     Education: some college.    Right handed   Drinks caffeine    Social Determinants of Health   Financial Resource Strain: Not on file  Food Insecurity: No Food Insecurity (04/21/2022)   Hunger  Vital Sign    Worried About Running Out of Food in the Last Year: Never true    Ran Out of Food in the Last Year: Never true  Transportation Needs: No Transportation Needs (04/21/2022)   PRAPARE - Administrator, Civil Service (Medical): No    Lack of Transportation (Non-Medical): No  Physical Activity: Not on file  Stress: Not on file  Social Connections: Not on file     Family History: The patient's family history includes Deep vein thrombosis in his brother; Diabetes in his sister; Esophageal cancer in his brother; Heart attack in his father and mother; Heart disease in his mother; Stroke in his brother and sister. ROS:   Please see the history of present illness.    All other systems reviewed and are negative.  EKGs/Labs/Other Studies Reviewed:    The following studies were reviewed today:  Cardiac Studies & Procedures   CARDIAC CATHETERIZATION  CARDIAC CATHETERIZATION 04/16/2022  Narrative   Prox LAD to Mid LAD lesion is 75% stenosed.   Ost LAD to Prox LAD lesion is 75% stenosed.   Prox Cx to Mid Cx lesion is 50% stenosed.   Ost RCA to Prox RCA lesion is 70% stenosed.   Dist RCA lesion is 80% stenosed with 80% stenosed side branch in Ost RPDA.   Ost 1st Diag lesion is 100% stenosed.   Ost 2nd Diag to 2nd Diag lesion is 100% stenosed.   Ost 1st Mrg lesion is 70% stenosed.   1st Mrg lesion is 99% stenosed.   Ost Cx to Prox Cx lesion is 70% stenosed.   Mid RCA stent is patent.   The left ventricular systolic function is normal.   LV end diastolic pressure is normal.   The left ventricular ejection fraction is 55-65% by visual estimate.   There is no aortic valve stenosis.  Severe calcific three-vessel coronary artery disease.  The change in anatomy from 2020 is the 99% lesion in the OM.  There is proximal disease in the OM at the bifurcation with the circumflex.  Will get a surgical evaluation for CABG.  His symptom burden has been increasing of late so we will  restart heparin 2 hours after the TR band has been removed.  Findings Coronary Findings Diagnostic  Dominance: Right  Left Anterior Descending There is mild diffuse disease throughout the vessel. Ost LAD to Prox LAD lesion is  75% stenosed. The lesion is eccentric. The lesion is severely calcified. Prox LAD to Mid LAD lesion is 75% stenosed.  First Diagonal Branch Ost 1st Diag lesion is 100% stenosed.  Second Diagonal [employer] 2nd Diag to 2nd Diag lesion is 100% stenosed.  Left Circumflex Ost Cx to Prox Cx lesion is 70% stenosed. The lesion is severely calcified. Prox Cx to Mid Cx lesion is 50% stenosed.  First Obtuse Marginal Branch Ost 1st Mrg lesion is 70% stenosed. The lesion is severely calcified. 1st Mrg lesion is 99% stenosed.  Right Coronary Artery Ost RCA to Prox RCA lesion is 70% stenosed. The lesion is severely calcified. Non-stenotic Mid RCA lesion was previously treated. The lesion is type C. The lesion is calcified. Dist RCA lesion is 80% stenosed with 80% stenosed side branch in Ost RPDA.  Right Posterior Descending Artery There is mild disease in the vessel.  Inferior Septal There is mild disease in the vessel.  Right Posterior Atrioventricular Artery There is mild disease in the vessel.  Intervention  No interventions have been documented.   CARDIAC CATHETERIZATION  CARDIAC CATHETERIZATION 12/08/2018  Narrative  Ost LAD to Prox LAD lesion is 75% stenosed.  Mid LAD to Dist LAD lesion is 75% stenosed.  Ost 1st Mrg lesion is 70% stenosed.  Ost RCA to Prox RCA lesion is 70% stenosed.  Previously placed Mid RCA drug eluting stent is widely patent.  Ost 1st Diag lesion is 100% stenosed.  Ost 2nd Diag to 2nd Diag lesion is 100% stenosed.  Ost Cx to Prox Cx lesion is 50% stenosed.  Prox Cx to Mid Cx lesion is 50% stenosed.  Dist RCA lesion is 80% stenosed with 80% stenosed side branch in Ost RPDA.  The left ventricular systolic function  is normal.  LV end diastolic pressure is normal.  The left ventricular ejection fraction is 55-65% by visual estimate.  1. Complex 3 vessel CAD. He has severely calcified coronary arteries. - 70% ostial LAD with bulky, eccentric lesion - segmental 75% mid LAD - occluded small first and second diagonals - 50% ostial and mid LCx - 70% ostial large OM1 - 70% ostial RCA. 80% distal RCA at bifurcation 2. Normal LV function. 3. Normal LVEDP  Plan; will intensify medical therapy for angina adding Imdur 30 mg daily. If he continues to have angina I would recommend CT surgery consult for consideration of CABG  Findings Coronary Findings Diagnostic  Dominance: Right  Left Anterior Descending Ost LAD to Prox LAD lesion is 75% stenosed. The lesion is eccentric. The lesion is severely calcified. Mid LAD to Dist LAD lesion is 75% stenosed.  First Diagonal Branch Ost 1st Diag lesion is 100% stenosed.  Second Diagonal [employer] 2nd Diag to 2nd Diag lesion is 100% stenosed.  Left Circumflex Ost Cx to Prox Cx lesion is 50% stenosed. The lesion is severely calcified. Prox Cx to Mid Cx lesion is 50% stenosed.  First Obtuse Marginal Branch Ost 1st Mrg lesion is 70% stenosed. The lesion is severely calcified.  Right Coronary Artery Ost RCA to Prox RCA lesion is 70% stenosed. The lesion is severely calcified. Previously placed Mid RCA drug eluting stent is widely patent. The lesion is type C. The lesion is calcified. Dist RCA lesion is 80% stenosed with 80% stenosed side branch in Ost RPDA.  Right Posterior Descending Artery There is mild disease in the vessel.  Inferior Septal There is mild disease in the vessel.  Right Posterior Atrioventricular Artery There is mild  disease in the vessel.  Intervention  No interventions have been documented.   STRESS TESTS  MYOCARDIAL PERFUSION IMAGING 04/22/2020  Narrative  The left ventricular ejection fraction is hyperdynamic (>65%).   Nuclear stress EF: 67%.  There was no ST segment deviation noted during stress.  No T wave inversion was noted during stress.  Defect 1: There is a small fixed defect of mild severity present in the apex location, with normal wall motion.  The study is normal. The small fixed defect as described above is likely a soft tissue attenuation.  This is a low risk study.   ECHOCARDIOGRAM  ECHOCARDIOGRAM COMPLETE BUBBLE STUDY 04/26/2022  Narrative ECHOCARDIOGRAM REPORT    Patient Name:   Anant Agard Date of Exam: 04/26/2022 Medical Rec #:  [id number]  Height:       66.0 in Accession #:    [id number] Weight:       179.9 lb Date of Birth:  [birth year]   BSA:          1.912 m Patient Age:    76 years   BP:           131/73 mmHg Patient Gender: M          HR:           65 bpm. Exam Location:  Inpatient  Procedure: 2D Echo, Saline Contrast Bubble Study and Intracardiac Opacification Agent  Indications:    pericardial effussion  History:        Patient has prior history of Echocardiogram examinations, most recent 03/23/2022. Acute MI and CAD, Signs/Symptoms:Chest Pain; Risk Factors:Hypertension, Dyslipidemia and Current Smoker.  Sonographer:    Cathie Hoops Referring Phys: Lowella Dandy, R   Sonographer Comments: Technically difficult study due to poor echo windows, patient is obese and no subcostal window. Image acquisition challenging due to patient body habitus and supine and surgocal incisions. IMPRESSIONS   1. Left ventricular ejection fraction, by estimation, is 65 to 70%. The left ventricle has normal function. The left ventricle has no regional wall motion abnormalities. 2. Right ventricular systolic function was not well visualized. The right ventricular size is normal. There is normal pulmonary artery systolic pressure. 3. A small pericardial effusion is present. 4. The mitral valve is normal in structure. Trivial mitral valve regurgitation. 5. The aortic valve is tricuspid.  Aortic valve regurgitation is not visualized. Aortic valve sclerosis/calcification is present, without any evidence of aortic stenosis. 6. Agitated saline contrast bubble study was negative, with no evidence of any interatrial shunt.  Comparison(s): The left ventricular function is unchanged.  FINDINGS Left Ventricle: Left ventricular ejection fraction, by estimation, is 65 to 70%. The left ventricle has normal function. The left ventricle has no regional wall motion abnormalities. Definity contrast agent was given IV to delineate the left ventricular endocardial borders. The left ventricular internal cavity size was normal in size. There is no left ventricular hypertrophy.  Right Ventricle: The right ventricular size is normal. Right vetricular wall thickness was not assessed. Right ventricular systolic function was not well visualized. There is normal pulmonary artery systolic pressure. The tricuspid regurgitant velocity is 1.75 m/s, and with an assumed right atrial pressure of 3 mmHg, the estimated right ventricular systolic pressure is 15.2 mmHg.  Left Atrium: Left atrial size was normal in size.  Right Atrium: Right atrial size was normal in size.  Pericardium: A small pericardial effusion is present.  Mitral Valve: The mitral valve is normal in structure. Trivial mitral valve regurgitation.  Tricuspid Valve:  The tricuspid valve is normal in structure. Tricuspid valve regurgitation is trivial.  Aortic Valve: The aortic valve is tricuspid. Aortic valve regurgitation is not visualized. Aortic valve sclerosis/calcification is present, without any evidence of aortic stenosis. Aortic valve mean gradient measures 3.7 mmHg. Aortic valve peak gradient measures 7.2 mmHg. Aortic valve area, by VTI measures 2.22 cm.  Pulmonic Valve: The pulmonic valve was not well visualized. Pulmonic valve regurgitation is not visualized. No evidence of pulmonic stenosis.  Aorta: The aortic root is normal in  size and structure.  IAS/Shunts: No atrial level shunt detected by color flow Doppler. Agitated saline contrast was given intravenously to evaluate for intracardiac shunting. Agitated saline contrast bubble study was negative, with no evidence of any interatrial shunt.   LEFT VENTRICLE PLAX 2D LVIDd:         5.20 cm      Diastology LVIDs:         3.50 cm      LV e' medial:    5.77 cm/s LV PW:         1.00 cm      LV E/e' medial:  13.9 LV IVS:        0.80 cm      LV e' lateral:   6.31 cm/s LVOT diam:     1.90 cm      LV E/e' lateral: 12.7 LV SV:         56 LV SV Index:   30 LVOT Area:     2.84 cm  LV Volumes (MOD) LV vol d, MOD A2C: 121.0 ml LV vol d, MOD A4C: 140.0 ml LV vol s, MOD A2C: 42.9 ml LV vol s, MOD A4C: 62.6 ml LV SV MOD A2C:     78.1 ml LV SV MOD A4C:     140.0 ml LV SV MOD BP:      81.7 ml  RIGHT VENTRICLE RV Basal diam:  3.30 cm RV Mid diam:    3.50 cm RV S prime:     7.94 cm/s TAPSE (M-mode): 1.0 cm  LEFT ATRIUM             Index        RIGHT ATRIUM           Index LA diam:        3.30 cm 1.73 cm/m   RA Area:     15.40 cm LA Vol (A2C):   34.8 ml 18.20 ml/m  RA Volume:   40.40 ml  21.13 ml/m LA Vol (A4C):   28.2 ml 14.75 ml/m LA Biplane Vol: 31.9 ml 16.69 ml/m AORTIC VALVE                    PULMONIC VALVE AV Area (Vmax):    2.05 cm     PV Vmax:       1.08 m/s AV Area (Vmean):   2.11 cm     PV Peak grad:  4.7 mmHg AV Area (VTI):     2.22 cm AV Vmax:           134.00 cm/s AV Vmean:          87.467 cm/s AV VTI:            0.254 m AV Peak Grad:      7.2 mmHg AV Mean Grad:      3.7 mmHg LVOT Vmax:         96.70 cm/s LVOT Vmean:  65.133 cm/s LVOT VTI:          0.199 m LVOT/AV VTI ratio: 0.78  AORTA Ao Root diam: 3.10 cm  MITRAL VALVE               TRICUSPID VALVE MV Area (PHT): 4.89 cm    TR Peak grad:   12.2 mmHg MV Decel Time: 155 msec    TR Vmax:        175.00 cm/s MV E velocity: 80.00 cm/s MV A velocity: 58.30 cm/s  SHUNTS MV E/A  ratio:  1.37        Systemic VTI:  0.20 m Systemic Diam: 1.90 cm  Dietrich Pates MD Electronically signed by Dietrich Pates MD Signature Date/Time: 04/26/2022/5:59:00 PM    Final   TEE  ECHO INTRAOPERATIVE TEE 04/19/2022  Narrative *INTRAOPERATIVE TRANSESOPHAGEAL REPORT *    Patient Name:   BRICEN VICTORY     Date of Exam: 04/19/2022 Medical Rec #:  [id number]      Height:       66.0 in Accession #:    [id number]     Weight:       176.7 lb Date of Birth:  [birth year]       BSA:          1.90 m Patient Age:    76 years       BP:           144/72 mmHg Patient Gender: M              HR:           56 bpm. Exam Location:  Anesthesiology  Transesophogeal exam was perform intraoperatively during surgical procedure. Patient was closely monitored under general anesthesia during the entirety of examination.  Indications:     I25.110 Atherosclerotic heart disease of native coronary artery with unstable angina pectoris Performing Phys: Leslye Peer MD Diagnosing Phys: Leslye Peer MD  Complications: No known complications during this procedure. POST-OP IMPRESSIONS _ Left Ventricle: The left ventricle is unchanged from pre-bypass. _ Right Ventricle: The right ventricle appears unchanged from pre-bypass. _ Aorta: The aorta appears unchanged from pre-bypass. _ Left Atrium: The left atrium appears unchanged from pre-bypass. _ Left Atrial Appendage: The left atrial appendage appears unchanged from pre-bypass. _ Aortic Valve: The aortic valve appears unchanged from pre-bypass. _ Mitral Valve: The mitral valve appears unchanged from pre-bypass. _ Tricuspid Valve: The tricuspid valve appears unchanged from pre-bypass. _ Pulmonic Valve: The pulmonic valve appears unchanged from pre-bypass. _ Interatrial Septum: The interatrial septum appears unchanged from pre-bypass. _ Interventricular Septum: The interventricular septum appears unchanged from pre-bypass. _ Pericardium: The pericardium appears unchanged  from pre-bypass.  PRE-OP FINDINGS Left Ventricle: The left ventricle has normal systolic function, with an ejection fraction of 55-60%. The cavity size was normal. No evidence of left ventricular regional wall motion abnormalities. There is no left ventricular hypertrophy.   Right Ventricle: The right ventricle has normal systolic function. The cavity was normal. There is no increase in right ventricular wall thickness.  Left Atrium: Left atrial size was normal in size.  Right Atrium: Right atrial size was normal in size.  Interatrial Septum: No atrial level shunt detected by color flow Doppler.  Pericardium: There is no evidence of pericardial effusion.  Mitral Valve: The mitral valve is normal in structure. Mitral valve regurgitation is not visualized by color flow Doppler. There is No evidence of mitral stenosis.  Tricuspid Valve: The tricuspid valve was normal in structure.  Tricuspid valve regurgitation was not visualized by color flow Doppler. No evidence of tricuspid stenosis is present.  Aortic Valve: The aortic valve is tricuspid Aortic valve regurgitation was not visualized by color flow Doppler. There is no stenosis of the aortic valve.   Pulmonic Valve: The pulmonic valve was not assessed. Pulmonic valve regurgitation was not assessed by color flow Doppler.   Aorta: The aortic root, ascending aorta and aortic arch are normal in size and structure.   Leslye Peer MD Electronically signed by Leslye Peer MD Signature Date/Time: 04/19/2022/2:47:16 PM    Final            EKG:  EKG ordered today and personally reviewed.  The ekg ordered today demonstrates sinus bradycardia 47 bpm right bundle branch block old inferior MI  Recent Labs: 11/25/2021: TSH 2.32 03/22/2022: NT-Pro BNP 71 04/18/2022: ALT 14 04/20/2022: Magnesium 2.0 04/30/2022: BUN 17; Creatinine, Ser 0.88; Hemoglobin 7.3; Platelets 266; Potassium 4.3; Sodium 135  Recent Lipid Panel    Component Value  Date/Time   CHOL 216 (H) 11/10/2021 0859   TRIG 202 (H) 11/10/2021 0859   HDL 58 11/10/2021 0859   CHOLHDL 3.7 11/10/2021 0859   CHOLHDL 3.1 07/21/2018 2254   VLDL 25 07/21/2018 2254   LDLCALC 123 (H) 11/10/2021 0859    Physical Exam:    VS:  BP 132/70   Pulse (!) 47   Ht 5\' 6"  (1.676 m)   Wt 166 lb 9.6 oz (75.6 kg)   SpO2 99%   BMI 26.89 kg/m     Wt Readings from Last 3 Encounters:  06/21/22 166 lb 9.6 oz (75.6 kg)  06/16/22 170 lb (77.1 kg)  06/10/22 169 lb (76.7 kg)     GEN: He does not look severely anemic well nourished, well developed in no acute distress HEENT: Normal NECK: No JVD; No carotid bruits LYMPHATICS: No lymphadenopathy CARDIAC: RRR, no murmurs, rubs, gallops RESPIRATORY:  Clear to auscultation without rales, wheezing or rhonchi  ABDOMEN: Soft, non-tender, non-distended MUSCULOSKELETAL:  No edema; No deformity  SKIN: Warm and dry NEUROLOGIC:  Alert and oriented x 3 PSYCHIATRIC:  Normal affect    Signed, Norman Herrlich, MD  06/21/2022 10:13 AM    [facility]

## 2022-06-21 ENCOUNTER — Ambulatory Visit: Payer: Medicare HMO | Attending: Cardiology | Admitting: Cardiology

## 2022-06-21 ENCOUNTER — Encounter: Payer: Self-pay | Admitting: Cardiology

## 2022-06-21 VITALS — BP 132/70 | HR 47 | Ht 66.0 in | Wt 166.6 lb

## 2022-06-21 DIAGNOSIS — I10 Essential (primary) hypertension: Secondary | ICD-10-CM | POA: Diagnosis not present

## 2022-06-21 DIAGNOSIS — I25110 Atherosclerotic heart disease of native coronary artery with unstable angina pectoris: Secondary | ICD-10-CM | POA: Diagnosis not present

## 2022-06-21 DIAGNOSIS — Z951 Presence of aortocoronary bypass graft: Secondary | ICD-10-CM | POA: Diagnosis not present

## 2022-06-21 DIAGNOSIS — E782 Mixed hyperlipidemia: Secondary | ICD-10-CM | POA: Diagnosis not present

## 2022-06-21 DIAGNOSIS — G7000 Myasthenia gravis without (acute) exacerbation: Secondary | ICD-10-CM

## 2022-06-21 MED ORDER — METOPROLOL TARTRATE 25 MG PO TABS: 25.0000 mg | ORAL_TABLET | Freq: Every day | ORAL | 3 refills | Status: DC

## 2022-06-21 NOTE — Patient Instructions (Signed)
Medication Instructions:  Your physician has recommended you make the following change in your medication:   START: Metoprolol tartrate 25 mg daily  *If you need a refill on your cardiac medications before your next appointment, please call your pharmacy*   Lab Work: Your physician recommends that you return for lab work in:   Labs today: CBC  If you have labs (blood work) drawn today and your tests are completely normal, you will receive your results only by: MyChart Message (if you have MyChart) OR A paper copy in the mail If you have any lab test that is abnormal or we need to change your treatment, we will call you to review the results.   Testing/Procedures: None   Follow-Up: At [facility], you and your health needs are our priority.  As part of our continuing mission to provide you with exceptional heart care, we have created designated Provider Care Teams.  These Care Teams include your primary Cardiologist (physician) and Advanced Practice Providers (APPs -  Physician Assistants and Nurse Practitioners) who all work together to provide you with the care you need, when you need it.  We recommend signing up for the patient portal called "MyChart".  Sign up information is provided on this After Visit Summary.  MyChart is used to connect with patients for Virtual Visits (Telemedicine).  Patients are able to view lab/test results, encounter notes, upcoming appointments, etc.  Non-urgent messages can be sent to your provider as well.   To learn more about what you can do with MyChart, go to [email or web address].    Your next appointment:   6 month(s)  Provider:   Norman Herrlich, MD    Other Instructions None

## 2022-06-22 ENCOUNTER — Telehealth: Payer: Self-pay | Admitting: Cardiology

## 2022-06-22 LAB — CBC
Hematocrit: 37 % — ABNORMAL LOW (ref 37.5–51.0)
Hemoglobin: 11.6 g/dL — ABNORMAL LOW (ref 13.0–17.7)
MCH: 28 pg (ref 26.6–33.0)
MCHC: 31.4 g/dL — ABNORMAL LOW (ref 31.5–35.7)
MCV: 89 fL (ref 79–97)
Platelets: 282 10*3/uL (ref 150–450)
RBC: 4.15 x10E6/uL (ref 4.14–5.80)
RDW: 15.8 % — ABNORMAL HIGH (ref 11.6–15.4)
WBC: 8.6 10*3/uL (ref 3.4–10.8)

## 2022-06-22 NOTE — Telephone Encounter (Signed)
Patient informed of result.

## 2022-06-22 NOTE — Telephone Encounter (Signed)
Patient is returning call about lab results. Patient stated he is going to for a walk and would like [name] to call their mobile phone number at [phone].

## 2022-06-23 ENCOUNTER — Telehealth: Payer: Self-pay | Admitting: Neurology

## 2022-06-23 NOTE — Telephone Encounter (Signed)
New message  Patient was told to call the office back with an updated the symptoms are much better.

## 2022-07-05 ENCOUNTER — Ambulatory Visit: Payer: Medicare HMO | Admitting: Neurology

## 2022-07-05 ENCOUNTER — Encounter: Payer: Self-pay | Admitting: Neurology

## 2022-07-05 VITALS — BP 146/69 | HR 63 | Ht 66.0 in | Wt 165.0 lb

## 2022-07-05 DIAGNOSIS — G7001 Myasthenia gravis with (acute) exacerbation: Secondary | ICD-10-CM

## 2022-07-05 DIAGNOSIS — G629 Polyneuropathy, unspecified: Secondary | ICD-10-CM | POA: Diagnosis not present

## 2022-07-05 MED ORDER — PREDNISONE 10 MG PO TABS: 25.0000 mg | ORAL_TABLET | Freq: Every day | ORAL | 3 refills | Status: DC

## 2022-07-05 NOTE — Patient Instructions (Signed)
Increase prednisone 25mg  daily  Call with an update in 1 month  I will see you back in 6 weeks

## 2022-07-05 NOTE — Progress Notes (Signed)
Follow-up Visit   Date: 07/05/22    Adrian Bell MRN: [id number] DOB: 22-Jan-1946   Interim History: Adrian Bell is a 77 y.o. right-handed Caucasian male with hypertension, kidney stones, and iron deficiency anemia returning to the clinic for follow-up of seropositive myasthenia gravis and neuropathy.   The patient was accompanied to the clinic by self.  IMPRESSION/PLAN: Seropositive bulbar and generalized myasthenia gravis with mild exacerbation.  Diagnosed 2019 s/p thymus resection (2024). He was doing very well on prednisone 10mg  until his CABG in February 2024 after which he has dysphagia, dysarthria, and weakness.  Since starting prednisone 20mg  these symptoms have improved, but not resolved.  MG history:  - Highest dose of prednisone is 30mg /d in 2019   - Tapering prednisone < 5mg  caused breakthrough weakness, so he has been on 5mg /d since early 2022   - January 2023 - new onset of bulbar weakness, prednisone titrated to 30mg  daily  - April 2023 - IVIG started due to bulbar symptoms   - June 2023 - IVIG adjusted to every 3 weeks, clinically improved  - August 2023 - started prednisone taper   - November 2023 - IVIG stopped  - Jan - March 2024 - prednisone 10mg  daily  - Feb 2024  - s/p CABG and thymus resection, MG exacerbation  - April 2024 - prednisone increased to 20mg /d  - May 2024 - prednisone increased to 25mg /d   PLAN:   Increase prednisone to 25mg  daily Call with update in 1 month  2. Peripheral neuropathy with distal paresthesias and sensory ataxia, progression Continue supportive care  Return to clinic in 6 weeks  ------------------------------------------------------------------------------  UPDATE 03/17/2022:  He is here for follow-up visit.  He is doing great with respect to MG and denies any double vision, droopiness of the eyelids, difficulty swallowing/talking, or limb weakness.  He has been able to taper prednisone down to 10mg  daily and remains off IVIG.   He has been having shortness of breath with exertion and some chest pressure, which has limited his activity level.  He is no longer doing yard work.   He has not scheduled an appointment with cardiology, but plans to do this.  Neuropathy is unchanged and he has noticed mild increased in unsteadiness/imbalance.  No falls.  He continues to walk independently.   UPDATE 06/16/2022: He was doing well with MG until February.  He underwent 3v CABG in February 2024.   Immediately post-op, he began having difficulty with swallowing, speech, spitting, and arm/leg weakness.  He was having difficulty moving his food in his mouth, initially having to manually use his fingers to get food out of the corners of his cheek.  His prednisone was increased to 20mg  daily last week and he has noticed mild improvement.  His swallowing and speech is better.   He continues to have mild slurred speech and bilateral leg weakness.  He denies shortness of breath and is able to lay on one pillow.    UPDATE 07/05/2022:  He has been on prednisone 20mg  for about month and reports speech, swallow, and generalized weakness has improved, but he still has some slightly difficulty when tired or after talking for longer periods.  He no longer has difficulty with spitting.  He has noticed increased numbness in the feet and balance.    Medications:  Current Outpatient Medications on File Prior to Visit  Medication Sig Dispense Refill   acetaminophen (TYLENOL) 325 MG tablet Take 2 tablets (650 mg total) by  mouth every 4 (four) hours as needed for fever or mild pain.     alum & mag hydroxide-simeth (MINTOX) 200-200-20 MG/5ML suspension Take 30 mLs by mouth as needed for indigestion or heartburn.     aspirin 81 MG chewable tablet Chew 1 tablet (81 mg total) by mouth daily. 90 tablet 1   Calcium Carb-Cholecalciferol (CALCIUM 600 + D PO) Take 1 tablet by mouth daily.     Calcium Carbonate-Vit D-Min (CALCIUM 1200 PO) Take 1 tablet by mouth daily.      clopidogrel (PLAVIX) 75 MG tablet Take 1 tablet (75 mg total) by mouth daily. 30 tablet 3   cyanocobalamin (VITAMIN B12) 1000 MCG tablet Take 1,000 mcg by mouth daily.     ferrous gluconate (FERGON) 324 MG tablet Take 1 tablet by mouth 3 (three) times a week. Monday, Wednesday AND Friday     metoprolol tartrate (LOPRESSOR) 25 MG tablet Take 1 tablet (25 mg total) by mouth daily. 90 tablet 3   nitroGLYCERIN (NITROSTAT) 0.4 MG SL tablet Place 1 tablet (0.4 mg total) under the tongue every 5 (five) minutes as needed for chest pain. 25 tablet 1   Pitavastatin Calcium (LIVALO) 2 MG TABS Take 2 mg by mouth 2 (two) times a week.     predniSONE (DELTASONE) 10 MG tablet Take 1 tablet (10 mg total) by mouth daily with breakfast. (Patient taking differently: Take 20 mg by mouth daily with breakfast.) 90 tablet 3   ramipril (ALTACE) 5 MG capsule Take 5 mg by mouth daily.     traMADol (ULTRAM) 50 MG tablet Take 1 tablet (50 mg total) by mouth every 6 (six) hours as needed for moderate pain. 30 tablet 0   No current facility-administered medications on file prior to visit.    Allergies:  Allergies  Allergen Reactions   Zetia [Ezetimibe] Other (See Comments)    Weakness, shortness of breath   Demerol [Meperidine Hcl]    Statins Other (See Comments)    myalgias    Vital Signs:  BP (!) 146/69   Pulse 63   Ht 5\' 6"  (1.676 m)   Wt 165 lb (74.8 kg)   SpO2 99%   BMI 26.63 kg/m   Neurological Exam: MENTAL STATUS including orientation to time, place, person, recent and remote memory, attention span and concentration, language, and fund of knowledge is normal.  There is mild dysarthria with prolonged talking.  CRANIAL NERVES:  Pupils equal round and reactive to light.  Normal conjugate, extra-ocular eye movements in all directions of gaze. Trace left ptosis.  Face is symmetric.  Facial muscles are 5/5 with orbicularis oculi and buccinator testing.   Palate elevates symmetrically.  Tongue is midline  with 5/5 strength, side-to-side movements are improved   MOTOR:  Motor strength is 5/5 in the legs bilaterally and 5/5 in the arms and with neck flexion. (Improved) No fatigability.  COORDINATION/GAIT:   Gait narrow based and stable. He is able to stand up without using arms to push off.   Data: CT head 12/08/2016:  Unremarkable  Labs 04/20/2017:   AChR binding 9.20*, blocking 76*, and modulating 90*  CT chest wo contrast 05/04/2017:   1. Unremarkable CT examination the chest. No anterior mediastinal mass/thymoma is identified. 2. No acute pulmonary findings or worrisome pulmonary lesions. 3. Significant three-vessel coronary artery calcifications. 4. Cholelithiasis.  Labs received 06/29/2017:  TPMT  13 (normal).  Lab Results  Component Value Date   VITAMINB12 259 04/16/2022      Thank  you for allowing me to participate in patient's care.  If I can answer any additional questions, I would be pleased to do so.    Sincerely,    Jayron Maqueda K. Posey Pronto, DO

## 2022-07-20 ENCOUNTER — Telehealth: Payer: Self-pay | Admitting: Cardiology

## 2022-07-20 MED ORDER — CLOPIDOGREL BISULFATE 75 MG PO TABS: 75.0000 mg | ORAL_TABLET | Freq: Every day | ORAL | 3 refills | Status: DC

## 2022-07-20 NOTE — Telephone Encounter (Signed)
*  STAT* If patient is at the pharmacy, call can be transferred to refill team.   1. Which medications need to be refilled? (please list name of each medication and dose if known) clopidogrel (PLAVIX) 75 MG tablet  2. Which pharmacy/location (including street and city if local pharmacy) is medication to be sent to? [facility] - [place], [place] - [address]    3. Do they need a 30 day or 90 day supply?  90 day supply

## 2022-08-09 ENCOUNTER — Telehealth: Payer: Self-pay | Admitting: Neurology

## 2022-08-09 NOTE — Telephone Encounter (Signed)
Called patient and informed him that per Dr. Allena Katz ok to increase prednisone 30 mg daily. Asked patient if he had enough medication for the increase and he stated yes. Patient had no further questions or concerns.

## 2022-08-09 NOTE — Telephone Encounter (Signed)
Patient wants to increase Prednisone to 30mg  until next visit with Dr.Patel-KB

## 2022-08-09 NOTE — Telephone Encounter (Signed)
OK to increase to prednisone 30mg  daily.

## 2022-08-09 NOTE — Telephone Encounter (Signed)
Patient called and states that he thinks we need to increase his Prednisone   to 30 mg

## 2022-08-18 ENCOUNTER — Ambulatory Visit: Payer: Medicare HMO | Admitting: Neurology

## 2022-08-19 ENCOUNTER — Telehealth: Payer: Self-pay | Admitting: Neurology

## 2022-08-19 MED ORDER — PREDNISONE 20 MG PO TABS: 30.0000 mg | ORAL_TABLET | Freq: Every day | ORAL | 1 refills | Status: DC

## 2022-08-19 NOTE — Telephone Encounter (Signed)
Rx sent.  While he is waiting for prednisone 20mg  tablets, he may take (3) of the 10mg  tablets.

## 2022-08-19 NOTE — Telephone Encounter (Signed)
Pt is calling in stating that he is having trouble getting Rx prednisone 20 MG from the [facility].  The pharmacy will be faxing something over is what the pt stated.  Pt would like to know what he should do he still has the 10 MG but he has not been able to get the 20 MG.

## 2022-08-19 NOTE — Telephone Encounter (Signed)
Checking with Dr. Allena Katz. Will update patient once I hear back.

## 2022-08-19 NOTE — Telephone Encounter (Signed)
Called patient and left a detailed message that his 20 mg prednisone was sent to pharmacy. For the time being he can take three 10 mg prednisone tablets. Left our contact information incase patient had any other questions or concerns.

## 2022-09-08 ENCOUNTER — Ambulatory Visit: Payer: Medicare HMO | Admitting: Neurology

## 2022-09-08 ENCOUNTER — Encounter: Payer: Self-pay | Admitting: Neurology

## 2022-09-08 VITALS — BP 125/68 | HR 63 | Ht 66.0 in | Wt 171.0 lb

## 2022-09-08 DIAGNOSIS — G7000 Myasthenia gravis without (acute) exacerbation: Secondary | ICD-10-CM

## 2022-09-08 NOTE — Progress Notes (Signed)
Follow-up Visit   Date: 09/08/22    Adrian Bell MRN: [id number] DOB: December 05, 1945   Interim History: Adrian Bell is a 77 y.o. right-handed Caucasian male with hypertension, kidney stones, and iron deficiency anemia returning to the clinic for follow-up of seropositive myasthenia gravis and neuropathy.   The patient was accompanied to the clinic by self.  IMPRESSION/PLAN: Seropositive bulbar and generalized myasthenia gravis without exacerbation.  Diagnosed 2019 s/p thymus resection (2024). He was doing very well on prednisone 10mg  until his CABG in February 2024 after which he has dysphagia, dysarthria, and weakness.   MG history:  - Highest dose of prednisone is 30mg /d in 2019   - Tapering prednisone < 5mg  caused breakthrough weakness, so he has been on 5mg /d since early 2022   - January 2023 - new onset of bulbar weakness, prednisone titrated to 30mg  daily  - April 2023 - IVIG started due to bulbar symptoms   - June 2023 - IVIG adjusted to every 3 weeks, clinically improved  - August 2023 - started prednisone taper   - November 2023 - IVIG stopped  - Jan - March 2024 - prednisone 10mg  daily  - Feb 2024  - s/p CABG and thymus resection, MG exacerbation  - April 2024 - prednisone increased to 20mg /d  - May 2024 - prednisone increased to 25mg /d  - June 2024 - prednisone increased to 30mg , symptoms improved   PLAN:   Continue prednisone 30mg  dialy  2. Peripheral neuropathy with distal paresthesias and sensory ataxia, progression Continue supportive care  3.  Bilateral index finger weakness, uncertain what is causing this has his motor strength at the DIP and PIP on exam is intact  Return to clinic in 2 months  ------------------------------------------------------------------------------  UPDATE 03/17/2022:  He is here for follow-up visit.  He is doing great with respect to MG and denies any double vision, droopiness of the eyelids, difficulty swallowing/talking, or limb  weakness.  He has been able to taper prednisone down to 10mg  daily and remains off IVIG.  He has been having shortness of breath with exertion and some chest pressure, which has limited his activity level.  He is no longer doing yard work.   He has not scheduled an appointment with cardiology, but plans to do this.  Neuropathy is unchanged and he has noticed mild increased in unsteadiness/imbalance.  No falls.  He continues to walk independently.   UPDATE 06/16/2022: He was doing well with MG until February.  He underwent 3v CABG in February 2024.   Immediately post-op, he began having difficulty with swallowing, speech, spitting, and arm/leg weakness.  He was having difficulty moving his food in his mouth, initially having to manually use his fingers to get food out of the corners of his cheek.  His prednisone was increased to 20mg  daily last week and he has noticed mild improvement.  His swallowing and speech is better.   He continues to have mild slurred speech and bilateral leg weakness.  He denies shortness of breath and is able to lay on one pillow.    UPDATE 07/05/2022:  He has been on prednisone 20mg  for about month and reports speech, swallow, and generalized weakness has improved, but he still has some slightly difficulty when tired or after talking for longer periods.  He no longer has difficulty with spitting.  He has noticed increased numbness in the feet and balance.   UPDATE 09/08/2022:  He is here for follow-up.  In June, he increased  prednisone to 30mg  which improved speech, swallow, and weakness.  He has trace difficulty with spitting and swallowing.    Starting earlier this year, he began noticing having bilateral index finger weakness, which he noticed when trying to dry his ears or in between his toes.  He states the fingers will flex inwards.  No problems with opening jars/bottles.  No neck pain.  Medications:  Current Outpatient Medications on File Prior to Visit  Medication Sig  Dispense Refill   acetaminophen (TYLENOL) 325 MG tablet Take 2 tablets (650 mg total) by mouth every 4 (four) hours as needed for fever or mild pain.     alum & mag hydroxide-simeth (MINTOX) 200-200-20 MG/5ML suspension Take 30 mLs by mouth as needed for indigestion or heartburn.     aspirin 81 MG chewable tablet Chew 1 tablet (81 mg total) by mouth daily. 90 tablet 1   Calcium Carb-Cholecalciferol (CALCIUM 600 + D PO) Take 1 tablet by mouth daily.     Calcium Carbonate-Vit D-Min (CALCIUM 1200 PO) Take 1 tablet by mouth daily.     clopidogrel (PLAVIX) 75 MG tablet Take 1 tablet (75 mg total) by mouth daily. 90 tablet 3   cyanocobalamin (VITAMIN B12) 1000 MCG tablet Take 1,000 mcg by mouth daily.     ferrous gluconate (FERGON) 324 MG tablet Take 1 tablet by mouth 3 (three) times a week. Monday, Wednesday AND Friday     metoprolol tartrate (LOPRESSOR) 25 MG tablet Take 1 tablet (25 mg total) by mouth daily. 90 tablet 3   nitroGLYCERIN (NITROSTAT) 0.4 MG SL tablet Place 1 tablet (0.4 mg total) under the tongue every 5 (five) minutes as needed for chest pain. 25 tablet 1   Pitavastatin Calcium (LIVALO) 2 MG TABS Take 2 mg by mouth 2 (two) times a week.     predniSONE (DELTASONE) 20 MG tablet Take 1.5 tablets (30 mg total) by mouth daily. 135 tablet 1   ramipril (ALTACE) 5 MG capsule Take 5 mg by mouth daily.     traMADol (ULTRAM) 50 MG tablet Take 1 tablet (50 mg total) by mouth every 6 (six) hours as needed for moderate pain. 30 tablet 0   predniSONE (DELTASONE) 10 MG tablet Take 2.5 tablets (25 mg total) by mouth daily with breakfast. (Patient not taking: Reported on 09/08/2022) 90 tablet 3   No current facility-administered medications on file prior to visit.    Allergies:  Allergies  Allergen Reactions   Zetia [Ezetimibe] Other (See Comments)    Weakness, shortness of breath   Demerol [Meperidine Hcl]    Statins Other (See Comments)    myalgias    Vital Signs:  BP 125/68   Pulse 63    Ht 5\' 6"  (1.676 m)   Wt 171 lb (77.6 kg)   SpO2 98%   BMI 27.60 kg/m   Neurological Exam: MENTAL STATUS including orientation to time, place, person, recent and remote memory, attention span and concentration, language, and fund of knowledge is normal.  There is no dysarthria (improved).  CRANIAL NERVES:  Pupils equal round and reactive to light.  Normal conjugate, extra-ocular eye movements in all directions of gaze. Trace left ptosis.  Face is symmetric.  Facial muscles are 5/5 with orbicularis oculi and buccinator testing.   Palate elevates symmetrically.  Tongue strength is 5/5  MOTOR:  Motor strength is 5/5 in the legs bilaterally and 5/5 in the arms and with neck flexion. No fatigability.  COORDINATION/GAIT:   Gait narrow based and  stable. He is able to stand up without using arms to push off.   Data: CT head 12/08/2016:  Unremarkable  Labs 04/20/2017:   AChR binding 9.20*, blocking 76*, and modulating 90*  CT chest wo contrast 05/04/2017:   1. Unremarkable CT examination the chest. No anterior mediastinal mass/thymoma is identified. 2. No acute pulmonary findings or worrisome pulmonary lesions. 3. Significant three-vessel coronary artery calcifications. 4. Cholelithiasis.  Labs received 06/29/2017:  TPMT  13 (normal).  Lab Results  Component Value Date   VITAMINB12 259 04/16/2022     Thank you for allowing me to participate in patient's care.  If I can answer any additional questions, I would be pleased to do so.    Sincerely,    Yashas Camilli K. Allena Katz, DO

## 2022-09-08 NOTE — Patient Instructions (Signed)
Stay on prednisone 30mg  daily  I will see you back in 2 months

## 2022-11-17 ENCOUNTER — Encounter: Payer: Self-pay | Admitting: Neurology

## 2022-11-17 ENCOUNTER — Ambulatory Visit: Payer: Medicare HMO | Admitting: Neurology

## 2022-11-17 VITALS — BP 143/75 | HR 67 | Ht 66.0 in | Wt 171.0 lb

## 2022-11-17 DIAGNOSIS — G7000 Myasthenia gravis without (acute) exacerbation: Secondary | ICD-10-CM

## 2022-11-17 DIAGNOSIS — G629 Polyneuropathy, unspecified: Secondary | ICD-10-CM | POA: Diagnosis not present

## 2022-11-17 NOTE — Progress Notes (Signed)
Follow-up Visit   Date: 11/17/22    Adrian Bell MRN: [id number] DOB: 07/23/1945   Interim History: Adrian Bell is a 77 y.o. right-handed Caucasian male with hypertension, kidney stones, and iron deficiency anemia returning to the clinic for follow-up of seropositive myasthenia gravis and neuropathy.   The patient was accompanied to the clinic by self.  IMPRESSION/PLAN: Seropositive bulbar and generalized myasthenia gravis without exacerbation.  Diagnosed 2019 s/p thymus resection (2024). He was doing very well on prednisone 10mg  until his CABG in February 2024 after which he has dysphagia, dysarthria, and weakness.  Since being on prednisone 30mg  symptoms have been controlled.  MG history:  - Highest dose of prednisone is 30mg /d in 2019   - Tapering prednisone < 5mg  caused breakthrough weakness, so he has been on 5mg /d since early 2022   - January 2023 - new onset of bulbar weakness, prednisone titrated to 30mg  daily  - April 2023 - IVIG started due to bulbar symptoms   - June 2023 - IVIG adjusted to every 3 weeks, clinically improved  - August 2023 - started prednisone taper   - November 2023 - IVIG stopped  - Jan - March 2024 - prednisone 10mg  daily  - Feb 2024  - s/p CABG and thymus resection, MG exacerbation  - April 2024 - prednisone increased to 20mg /d  - May 2024 - prednisone increased to 25mg /d  - June 2024 - prednisone increased to 30mg , symptoms improved   PLAN:   Reduce prednisone to 25mg  daily x 1 month, then 20mg  daily  2. Peripheral neuropathy with distal paresthesias and sensory ataxia, progression Continue supportive care  Return to clinic in 2 months  ------------------------------------------------------------------------------  UPDATE 03/17/2022:  He is here for follow-up visit.  He is doing great with respect to MG and denies any double vision, droopiness of the eyelids, difficulty swallowing/talking, or limb weakness.  He has been able to taper  prednisone down to 10mg  daily and remains off IVIG.  He has been having shortness of breath with exertion and some chest pressure, which has limited his activity level.  He is no longer doing yard work.   He has not scheduled an appointment with cardiology, but plans to do this.  Neuropathy is unchanged and he has noticed mild increased in unsteadiness/imbalance.  No falls.  He continues to walk independently.   UPDATE 06/16/2022: He was doing well with MG until February.  He underwent 3v CABG in February 2024.   Immediately post-op, he began having difficulty with swallowing, speech, spitting, and arm/leg weakness.  He was having difficulty moving his food in his mouth, initially having to manually use his fingers to get food out of the corners of his cheek.  His prednisone was increased to 20mg  daily last week and he has noticed mild improvement.  His swallowing and speech is better.   He continues to have mild slurred speech and bilateral leg weakness.  He denies shortness of breath and is able to lay on one pillow.    UPDATE 07/05/2022:  He has been on prednisone 20mg  for about month and reports speech, swallow, and generalized weakness has improved, but he still has some slightly difficulty when tired or after talking for longer periods.  He no longer has difficulty with spitting.  He has noticed increased numbness in the feet and balance.   UPDATE 09/08/2022:  He is here for follow-up.  In June, he increased prednisone to 30mg  which improved speech, swallow, and weakness.  He has trace difficulty with spitting and swallowing.    Starting earlier this year, he began noticing having bilateral index finger weakness, which he noticed when trying to dry his ears or in between his toes.  He states the fingers will flex inwards.  No problems with opening jars/bottles.  No neck pain.  UPDATE 11/17/2022:  He is here for follow-up with his wife.  He has been doing well with no problems with speech, swallow,  ptosis, or weakness.  He has been compliant with taking prednisone 30mg  daily.  He feels that his neuropathy is slightly getting worse.  Balance is good.  No falls.   Medications:  Current Outpatient Medications on File Prior to Visit  Medication Sig Dispense Refill   acetaminophen (TYLENOL) 325 MG tablet Take 2 tablets (650 mg total) by mouth every 4 (four) hours as needed for fever or mild pain.     alum & mag hydroxide-simeth (MINTOX) 200-200-20 MG/5ML suspension Take 30 mLs by mouth as needed for indigestion or heartburn.     aspirin 81 MG chewable tablet Chew 1 tablet (81 mg total) by mouth daily. 90 tablet 1   Calcium Carb-Cholecalciferol (CALCIUM 600 + D PO) Take 1 tablet by mouth daily.     Calcium Carbonate-Vit D-Min (CALCIUM 1200 PO) Take 1 tablet by mouth daily.     clopidogrel (PLAVIX) 75 MG tablet Take 1 tablet (75 mg total) by mouth daily. 90 tablet 3   cyanocobalamin (VITAMIN B12) 1000 MCG tablet Take 1,000 mcg by mouth daily.     ferrous gluconate (FERGON) 324 MG tablet Take 1 tablet by mouth 3 (three) times a week. Monday, Wednesday AND Friday     metoprolol tartrate (LOPRESSOR) 25 MG tablet Take 1 tablet (25 mg total) by mouth daily. 90 tablet 3   nitroGLYCERIN (NITROSTAT) 0.4 MG SL tablet Place 1 tablet (0.4 mg total) under the tongue every 5 (five) minutes as needed for chest pain. 25 tablet 1   Pitavastatin Calcium (LIVALO) 2 MG TABS Take 2 mg by mouth 2 (two) times a week.     predniSONE (DELTASONE) 20 MG tablet Take 1.5 tablets (30 mg total) by mouth daily. 135 tablet 1   ramipril (ALTACE) 5 MG capsule Take 5 mg by mouth daily.     traMADol (ULTRAM) 50 MG tablet Take 1 tablet (50 mg total) by mouth every 6 (six) hours as needed for moderate pain. 30 tablet 0   predniSONE (DELTASONE) 10 MG tablet Take 2.5 tablets (25 mg total) by mouth daily with breakfast. (Patient not taking: Reported on 09/08/2022) 90 tablet 3   No current facility-administered medications on file prior  to visit.    Allergies:  Allergies  Allergen Reactions   Zetia [Ezetimibe] Other (See Comments)    Weakness, shortness of breath   Demerol [Meperidine Hcl]    Statins Other (See Comments)    myalgias    Vital Signs:  BP (!) 143/75   Pulse 67   Ht 5\' 6"  (1.676 m)   Wt 171 lb (77.6 kg)   SpO2 98%   BMI 27.60 kg/m   Neurological Exam: MENTAL STATUS including orientation to time, place, person, recent and remote memory, attention span and concentration, language, and fund of knowledge is normal.  There is no dysarthria.  CRANIAL NERVES:  Pupils equal round and reactive to light.  Normal conjugate, extra-ocular eye movements in all directions of gaze. Trace left ptosis.  Face is symmetric.  Facial muscles are 5/5 with  orbicularis oculi and buccinator testing.   Palate elevates symmetrically.  Tongue strength is 5/5  MOTOR:  Motor strength is 5/5 in the legs bilaterally and 5/5 in the arms and with neck flexion. No fatigability.  SENSORY:  Reduced vibration at the ankles  COORDINATION/GAIT:   Gait narrow based and stable.   Data: CT head 12/08/2016:  Unremarkable  Labs 04/20/2017:   AChR binding 9.20*, blocking 76*, and modulating 90*  CT chest wo contrast 05/04/2017:   1. Unremarkable CT examination the chest. No anterior mediastinal mass/thymoma is identified. 2. No acute pulmonary findings or worrisome pulmonary lesions. 3. Significant three-vessel coronary artery calcifications. 4. Cholelithiasis.  Labs received 06/29/2017:  TPMT  13 (normal).  Lab Results  Component Value Date   VITAMINB12 259 04/16/2022     Thank you for allowing me to participate in patient's care.  If I can answer any additional questions, I would be pleased to do so.    Sincerely,    Lowry Bala K. Allena Katz, DO

## 2022-11-17 NOTE — Patient Instructions (Signed)
Reduce prednisone 25mg  daily x 1 month, then reduce to 20mg  daily.  I will see you back in 2 months

## 2023-01-05 ENCOUNTER — Telehealth: Payer: Self-pay | Admitting: Cardiology

## 2023-01-05 NOTE — Telephone Encounter (Signed)
Pt c/o medication issue:  1. Name of Medication: clopidogrel (PLAVIX) 75 MG tablet   2. How are you currently taking this medication (dosage and times per day)?    3. Are you having a reaction (difficulty breathing--STAT)? no  4. What is your medication issue? Patient states that the is having a heard time with bruising and bleed. Seeing if he could cut back on the medication. Please advise

## 2023-01-07 NOTE — Telephone Encounter (Signed)
Patient notified of the following per Dr. Dulce Sellar and agreed with plan. Med list reconciled. I also, made his 6 month follow

## 2023-01-19 ENCOUNTER — Ambulatory Visit: Payer: Medicare HMO | Admitting: Neurology

## 2023-01-19 ENCOUNTER — Encounter: Payer: Self-pay | Admitting: Neurology

## 2023-01-19 VITALS — BP 142/74 | HR 60 | Ht 66.0 in | Wt 176.0 lb

## 2023-01-19 DIAGNOSIS — Z79899 Other long term (current) drug therapy: Secondary | ICD-10-CM | POA: Diagnosis not present

## 2023-01-19 DIAGNOSIS — G7000 Myasthenia gravis without (acute) exacerbation: Secondary | ICD-10-CM | POA: Diagnosis not present

## 2023-01-19 MED ORDER — PREDNISONE 5 MG PO TABS: 5.0000 mg | ORAL_TABLET | Freq: Every day | ORAL | 1 refills | Status: DC

## 2023-01-19 NOTE — Patient Instructions (Signed)
Reduce prednisone to 15mg  daliy x 1 month, then you may reduce it to 10mg  daily.

## 2023-01-19 NOTE — Progress Notes (Signed)
Follow-up Visit   Date: 01/19/23    Adrian Bell MRN: [id number] DOB: Jan 10, 1946   Interim History: Adrian Bell is a 77 y.o. right-handed Caucasian male with hypertension, kidney stones, and iron deficiency anemia returning to the clinic for follow-up of seropositive myasthenia gravis and neuropathy.   The patient was accompanied to the clinic by self.  IMPRESSION/PLAN: Seropositive bulbar and generalized myasthenia gravis without exacerbation.  Diagnosed 2019 s/p thymus resection in 2024.  He was doing very well on prednisone 10mg  until his CABG in February 2024 after which he has dysphagia, dysarthria, and weakness.  Since being on prednisone 30mg  symptoms have been controlled. He has been tolerating prednisone taper.  MG history:  - Highest dose of prednisone is 30mg /d in 2019   - Tapering prednisone < 5mg  caused breakthrough weakness, so he has been on 5mg /d since early 2022   - January 2023 - new onset of bulbar weakness, prednisone titrated to 30mg  daily  - April 2023 - IVIG started due to bulbar symptoms   - June 2023 - IVIG adjusted to every 3 weeks, clinically improved  - August 2023 - started prednisone taper   - November 2023 - IVIG stopped  - Jan - March 2024 - prednisone 10mg  daily  - Feb 2024  - s/p CABG and thymus resection, MG exacerbation  - April 2024 - prednisone increased to 20mg /d  - May 2024 - prednisone increased to 25mg /d  - June 2024 - prednisone increased to 30mg , symptoms improved   PLAN:   Reduce prednisone to 15mg  daily x 1 month, then 10mg  daily.  2. Peripheral neuropathy with distal paresthesias and sensory ataxia, progression Continue supportive care  Return to clinic in 3 months  ------------------------------------------------------------------------------  UPDATE 03/17/2022:  He is here for follow-up visit.  He is doing great with respect to MG and denies any double vision, droopiness of the eyelids, difficulty swallowing/talking, or limb  weakness.  He has been able to taper prednisone down to 10mg  daily and remains off IVIG.  He has been having shortness of breath with exertion and some chest pressure, which has limited his activity level.  He is no longer doing yard work.   He has not scheduled an appointment with cardiology, but plans to do this.  Neuropathy is unchanged and he has noticed mild increased in unsteadiness/imbalance.  No falls.  He continues to walk independently.   UPDATE 06/16/2022: He was doing well with MG until February.  He underwent 3v CABG in February 2024.   Immediately post-op, he began having difficulty with swallowing, speech, spitting, and arm/leg weakness.  He was having difficulty moving his food in his mouth, initially having to manually use his fingers to get food out of the corners of his cheek.  His prednisone was increased to 20mg  daily last week and he has noticed mild improvement.  His swallowing and speech is better.   He continues to have mild slurred speech and bilateral leg weakness.  He denies shortness of breath and is able to lay on one pillow.    UPDATE 07/05/2022:  He has been on prednisone 20mg  for about month and reports speech, swallow, and generalized weakness has improved, but he still has some slightly difficulty when tired or after talking for longer periods.  He no longer has difficulty with spitting.  He has noticed increased numbness in the feet and balance.   UPDATE 09/08/2022:  He is here for follow-up.  In June, he increased prednisone  to 30mg  which improved speech, swallow, and weakness.  He has trace difficulty with spitting and swallowing.    Starting earlier this year, he began noticing having bilateral index finger weakness, which he noticed when trying to dry his ears or in between his toes.  He states the fingers will flex inwards.  No problems with opening jars/bottles.  No neck pain.  UPDATE 11/17/2022:   He has been compliant with taking prednisone 30mg  daily.    UPDATE  01/19/2023:  He is here for follow-up with his wife.  He has been able to reduce prednisone to 20mg  daily and doing well.  No problems with ptosis, double vision, limb weakness, or problems with speech/swallow. Neuropathy is stable with numbness and remain from the mid-calf down in the feet. Balance is good. No falls.   Medications:  Current Outpatient Medications on File Prior to Visit  Medication Sig Dispense Refill   acetaminophen (TYLENOL) 325 MG tablet Take 2 tablets (650 mg total) by mouth every 4 (four) hours as needed for fever or mild pain.     alum & mag hydroxide-simeth (MINTOX) 200-200-20 MG/5ML suspension Take 30 mLs by mouth as needed for indigestion or heartburn.     aspirin 81 MG chewable tablet Chew 1 tablet (81 mg total) by mouth daily. 90 tablet 1   Calcium Carb-Cholecalciferol (CALCIUM 600 + D PO) Take 1 tablet by mouth daily.     Calcium Carbonate-Vit D-Min (CALCIUM 1200 PO) Take 1 tablet by mouth daily.     clopidogrel (PLAVIX) 75 MG tablet Take 1 tablet (75 mg total) by mouth daily. 90 tablet 3   cyanocobalamin (VITAMIN B12) 1000 MCG tablet Take 1,000 mcg by mouth daily.     ferrous gluconate (FERGON) 324 MG tablet Take 1 tablet by mouth 3 (three) times a week. Monday, Wednesday AND Friday     metoprolol tartrate (LOPRESSOR) 25 MG tablet Take 1 tablet (25 mg total) by mouth daily. 90 tablet 3   nitroGLYCERIN (NITROSTAT) 0.4 MG SL tablet Place 1 tablet (0.4 mg total) under the tongue every 5 (five) minutes as needed for chest pain. 25 tablet 1   Pitavastatin Calcium (LIVALO) 2 MG TABS Take 2 mg by mouth 2 (two) times a week.     predniSONE (DELTASONE) 10 MG tablet Take 2.5 tablets (25 mg total) by mouth daily with breakfast. 90 tablet 3   predniSONE (DELTASONE) 20 MG tablet Take 1.5 tablets (30 mg total) by mouth daily. 135 tablet 1   ramipril (ALTACE) 5 MG capsule Take 5 mg by mouth daily.     traMADol (ULTRAM) 50 MG tablet Take 1 tablet (50 mg total) by mouth every 6 (six)  hours as needed for moderate pain. 30 tablet 0   No current facility-administered medications on file prior to visit.    Allergies:  Allergies  Allergen Reactions   Zetia [Ezetimibe] Other (See Comments)    Weakness, shortness of breath   Demerol [Meperidine Hcl]    Statins Other (See Comments)    myalgias    Vital Signs:  BP (!) 142/74   Pulse 60   Ht 5\' 6"  (1.676 m)   Wt 176 lb (79.8 kg)   SpO2 98%   BMI 28.41 kg/m   Neurological Exam: MENTAL STATUS including orientation to time, place, person, recent and remote memory, attention span and concentration, language, and fund of knowledge is normal.  There is no dysarthria.  CRANIAL NERVES:  Pupils equal round and reactive to light.  Normal conjugate, extra-ocular eye movements in all directions of gaze. Trace left ptosis.  Face is symmetric.  Facial muscles are 5/5 with orbicularis oculi and buccinator testing.   Palate elevates symmetrically.  Tongue strength is 5/5  MOTOR:  Motor strength is 5/5 in the legs bilaterally and 5/5 in the arms and with neck flexion. No fatigability.  SENSORY:  Reduced vibration at the ankles  COORDINATION/GAIT:   Gait narrow based and stable.   Data: CT head 12/08/2016:  Unremarkable  Labs 04/20/2017:   AChR binding 9.20*, blocking 76*, and modulating 90*  CT chest wo contrast 05/04/2017:   1. Unremarkable CT examination the chest. No anterior mediastinal mass/thymoma is identified. 2. No acute pulmonary findings or worrisome pulmonary lesions. 3. Significant three-vessel coronary artery calcifications. 4. Cholelithiasis.  Labs received 06/29/2017:  TPMT  13 (normal).  Lab Results  Component Value Date   VITAMINB12 259 04/16/2022     Thank you for allowing me to participate in patient's care.  If I can answer any additional questions, I would be pleased to do so.    Sincerely,    Inette Doubrava K. Allena Katz, DO

## 2023-01-31 NOTE — Progress Notes (Unsigned)
Cardiology Office Note:    Date:  01/31/2023   ID:  Joshua Mockler, DOB May 30, 1945, MRN [id number]  PCP:  [facility]  Cardiologist:  Norman Herrlich, MD    Referring MD: [facility]    ASSESSMENT:    1. Coronary artery disease involving native coronary artery of native heart with unstable angina pectoris (HCC)   2. Hx of CABG   3. Essential hypertension   4. Statin intolerance   5. Mixed hyperlipidemia   6. Myasthenia gravis (HCC)    PLAN:    In order of problems listed above:  ***   Next appointment: ***   Medication Adjustments/Labs and Tests Ordered: Current medicines are reviewed at length with the patient today.  Concerns regarding medicines are outlined above.  No orders of the defined types were placed in this encounter.  No orders of the defined types were placed in this encounter.    History of Present Illness:    Mohsin Gruhlke is a 77 y.o. male with a hx of CAD with CABG 04/19/2022 including ligation of the atrial appendage laminectomy hypertension hyperlipidemia with statin intolerance myasthenia gravis last seen 06/21/2022. Compliance with diet, lifestyle and medications: *** Past Medical History:  Diagnosis Date   Accidental exposure to phenoxyacid derivative herbicide 06/10/2022   May 18, 2017 Entered By: Alain Marion Comment: AGENT ORANGE EXPOSURE IN [facility]   Actinic keratosis 06/10/2022   Acute MI, inferior wall (HCC) 07/21/2018   Angina pectoris (HCC) 12/08/2018   Arthritis 06/10/2022   CAD in native artery 08/01/2018   Dysphagia    Essential (primary) hypertension 07/24/2018   Gastro-esophageal reflux disease without esophagitis 06/10/2022   Heart attack (HCC) 2020   Hyperlipidemia    Iron deficiency anemia, unspecified 06/10/2022   Kidney stones    Long-term corticosteroid use 03/08/2018   Myasthenia gravis (HCC)    Myasthenia gravis without (acute) exacerbation (HCC) 05/03/2017   Neuropathy 06/10/2022   NSTEMI (non-ST elevated  myocardial infarction) (HCC) 04/15/2022   Obesity 06/10/2022   Open angle with borderline findings, low risk, bilateral 06/10/2022   Pain in left knee 06/10/2022   S/P CABG x 3 04/19/2022   S/P thymectomy 04/19/2022   STEMI (ST elevation myocardial infarction) (HCC)    Unilateral primary osteoarthritis, left knee 06/10/2022   Urinary calculus 06/10/2022    Current Medications: No outpatient medications have been marked as taking for the 02/01/23 encounter (Appointment) with Baldo Daub, MD.      EKGs/Labs/Other Studies Reviewed:    The following studies were reviewed today:  Cardiac Studies & Procedures   CARDIAC CATHETERIZATION  CARDIAC CATHETERIZATION 04/16/2022  Narrative   Prox LAD to Mid LAD lesion is 75% stenosed.   Ost LAD to Prox LAD lesion is 75% stenosed.   Prox Cx to Mid Cx lesion is 50% stenosed.   Ost RCA to Prox RCA lesion is 70% stenosed.   Dist RCA lesion is 80% stenosed with 80% stenosed side branch in Ost RPDA.   Ost 1st Diag lesion is 100% stenosed.   Ost 2nd Diag to 2nd Diag lesion is 100% stenosed.   Ost 1st Mrg lesion is 70% stenosed.   1st Mrg lesion is 99% stenosed.   Ost Cx to Prox Cx lesion is 70% stenosed.   Mid RCA stent is patent.   The left ventricular systolic function is normal.   LV end diastolic pressure is normal.   The left ventricular ejection fraction is 55-65% by visual estimate.   There  is no aortic valve stenosis.  Severe calcific three-vessel coronary artery disease.  The change in anatomy from 2020 is the 99% lesion in the OM.  There is proximal disease in the OM at the bifurcation with the circumflex.  Will get a surgical evaluation for CABG.  His symptom burden has been increasing of late so we will restart heparin 2 hours after the TR band has been removed.  Findings Coronary Findings Diagnostic  Dominance: Right  Left Anterior Descending There is mild diffuse disease throughout the vessel. Ost LAD to Prox LAD lesion  is 75% stenosed. The lesion is eccentric. The lesion is severely calcified. Prox LAD to Mid LAD lesion is 75% stenosed.  First Diagonal Branch Ost 1st Diag lesion is 100% stenosed.  Second Diagonal [employer] 2nd Diag to 2nd Diag lesion is 100% stenosed.  Left Circumflex Ost Cx to Prox Cx lesion is 70% stenosed. The lesion is severely calcified. Prox Cx to Mid Cx lesion is 50% stenosed.  First Obtuse Marginal Branch Ost 1st Mrg lesion is 70% stenosed. The lesion is severely calcified. 1st Mrg lesion is 99% stenosed.  Right Coronary Artery Ost RCA to Prox RCA lesion is 70% stenosed. The lesion is severely calcified. Non-stenotic Mid RCA lesion was previously treated. The lesion is type C. The lesion is calcified. Dist RCA lesion is 80% stenosed with 80% stenosed side branch in Ost RPDA.  Right Posterior Descending Artery There is mild disease in the vessel.  Inferior Septal There is mild disease in the vessel.  Right Posterior Atrioventricular Artery There is mild disease in the vessel.  Intervention  No interventions have been documented.   CARDIAC CATHETERIZATION  CARDIAC CATHETERIZATION 12/08/2018  Narrative  Ost LAD to Prox LAD lesion is 75% stenosed.  Mid LAD to Dist LAD lesion is 75% stenosed.  Ost 1st Mrg lesion is 70% stenosed.  Ost RCA to Prox RCA lesion is 70% stenosed.  Previously placed Mid RCA drug eluting stent is widely patent.  Ost 1st Diag lesion is 100% stenosed.  Ost 2nd Diag to 2nd Diag lesion is 100% stenosed.  Ost Cx to Prox Cx lesion is 50% stenosed.  Prox Cx to Mid Cx lesion is 50% stenosed.  Dist RCA lesion is 80% stenosed with 80% stenosed side branch in Ost RPDA.  The left ventricular systolic function is normal.  LV end diastolic pressure is normal.  The left ventricular ejection fraction is 55-65% by visual estimate.  1. Complex 3 vessel CAD. He has severely calcified coronary arteries. - 70% ostial LAD with bulky,  eccentric lesion - segmental 75% mid LAD - occluded small first and second diagonals - 50% ostial and mid LCx - 70% ostial large OM1 - 70% ostial RCA. 80% distal RCA at bifurcation 2. Normal LV function. 3. Normal LVEDP  Plan; will intensify medical therapy for angina adding Imdur 30 mg daily. If he continues to have angina I would recommend CT surgery consult for consideration of CABG  Findings Coronary Findings Diagnostic  Dominance: Right  Left Anterior Descending Ost LAD to Prox LAD lesion is 75% stenosed. The lesion is eccentric. The lesion is severely calcified. Mid LAD to Dist LAD lesion is 75% stenosed.  First Diagonal Branch Ost 1st Diag lesion is 100% stenosed.  Second Diagonal [employer] 2nd Diag to 2nd Diag lesion is 100% stenosed.  Left Circumflex Ost Cx to Prox Cx lesion is 50% stenosed. The lesion is severely calcified. Prox Cx to Mid Cx lesion is 50%  stenosed.  First Obtuse Marginal Branch Ost 1st Mrg lesion is 70% stenosed. The lesion is severely calcified.  Right Coronary Artery Ost RCA to Prox RCA lesion is 70% stenosed. The lesion is severely calcified. Previously placed Mid RCA drug eluting stent is widely patent. The lesion is type C. The lesion is calcified. Dist RCA lesion is 80% stenosed with 80% stenosed side branch in Ost RPDA.  Right Posterior Descending Artery There is mild disease in the vessel.  Inferior Septal There is mild disease in the vessel.  Right Posterior Atrioventricular Artery There is mild disease in the vessel.  Intervention  No interventions have been documented.   STRESS TESTS  MYOCARDIAL PERFUSION IMAGING 04/22/2020  Narrative  The left ventricular ejection fraction is hyperdynamic (>65%).  Nuclear stress EF: 67%.  There was no ST segment deviation noted during stress.  No T wave inversion was noted during stress.  Defect 1: There is a small fixed defect of mild severity present in the apex location, with  normal wall motion.  The study is normal. The small fixed defect as described above is likely a soft tissue attenuation.  This is a low risk study.   ECHOCARDIOGRAM  ECHOCARDIOGRAM COMPLETE BUBBLE STUDY 04/26/2022  Narrative ECHOCARDIOGRAM REPORT    Patient Name:   Tacori Kulaga Date of Exam: 04/26/2022 Medical Rec #:  [id number]  Height:       66.0 in Accession #:    [id number] Weight:       179.9 lb Date of Birth:  07-09-1945   BSA:          1.912 m Patient Age:    76 years   BP:           131/73 mmHg Patient Gender: M          HR:           65 bpm. Exam Location:  Inpatient  Procedure: 2D Echo, Saline Contrast Bubble Study and Intracardiac Opacification Agent  Indications:    pericardial effussion  History:        Patient has prior history of Echocardiogram examinations, most recent 03/23/2022. Acute MI and CAD, Signs/Symptoms:Chest Pain; Risk Factors:Hypertension, Dyslipidemia and Current Smoker.  Sonographer:    Cathie Hoops Referring Phys: Lowella Dandy, R   Sonographer Comments: Technically difficult study due to poor echo windows, patient is obese and no subcostal window. Image acquisition challenging due to patient body habitus and supine and surgocal incisions. IMPRESSIONS   1. Left ventricular ejection fraction, by estimation, is 65 to 70%. The left ventricle has normal function. The left ventricle has no regional wall motion abnormalities. 2. Right ventricular systolic function was not well visualized. The right ventricular size is normal. There is normal pulmonary artery systolic pressure. 3. A small pericardial effusion is present. 4. The mitral valve is normal in structure. Trivial mitral valve regurgitation. 5. The aortic valve is tricuspid. Aortic valve regurgitation is not visualized. Aortic valve sclerosis/calcification is present, without any evidence of aortic stenosis. 6. Agitated saline contrast bubble study was negative, with no evidence of any interatrial  shunt.  Comparison(s): The left ventricular function is unchanged.  FINDINGS Left Ventricle: Left ventricular ejection fraction, by estimation, is 65 to 70%. The left ventricle has normal function. The left ventricle has no regional wall motion abnormalities. Definity contrast agent was given IV to delineate the left ventricular endocardial borders. The left ventricular internal cavity size was normal in size. There is no left ventricular hypertrophy.  Right Ventricle:  The right ventricular size is normal. Right vetricular wall thickness was not assessed. Right ventricular systolic function was not well visualized. There is normal pulmonary artery systolic pressure. The tricuspid regurgitant velocity is 1.75 m/s, and with an assumed right atrial pressure of 3 mmHg, the estimated right ventricular systolic pressure is 15.2 mmHg.  Left Atrium: Left atrial size was normal in size.  Right Atrium: Right atrial size was normal in size.  Pericardium: A small pericardial effusion is present.  Mitral Valve: The mitral valve is normal in structure. Trivial mitral valve regurgitation.  Tricuspid Valve: The tricuspid valve is normal in structure. Tricuspid valve regurgitation is trivial.  Aortic Valve: The aortic valve is tricuspid. Aortic valve regurgitation is not visualized. Aortic valve sclerosis/calcification is present, without any evidence of aortic stenosis. Aortic valve mean gradient measures 3.7 mmHg. Aortic valve peak gradient measures 7.2 mmHg. Aortic valve area, by VTI measures 2.22 cm.  Pulmonic Valve: The pulmonic valve was not well visualized. Pulmonic valve regurgitation is not visualized. No evidence of pulmonic stenosis.  Aorta: The aortic root is normal in size and structure.  IAS/Shunts: No atrial level shunt detected by color flow Doppler. Agitated saline contrast was given intravenously to evaluate for intracardiac shunting. Agitated saline contrast bubble study was negative,  with no evidence of any interatrial shunt.   LEFT VENTRICLE PLAX 2D LVIDd:         5.20 cm      Diastology LVIDs:         3.50 cm      LV e' medial:    5.77 cm/s LV PW:         1.00 cm      LV E/e' medial:  13.9 LV IVS:        0.80 cm      LV e' lateral:   6.31 cm/s LVOT diam:     1.90 cm      LV E/e' lateral: 12.7 LV SV:         56 LV SV Index:   30 LVOT Area:     2.84 cm  LV Volumes (MOD) LV vol d, MOD A2C: 121.0 ml LV vol d, MOD A4C: 140.0 ml LV vol s, MOD A2C: 42.9 ml LV vol s, MOD A4C: 62.6 ml LV SV MOD A2C:     78.1 ml LV SV MOD A4C:     140.0 ml LV SV MOD BP:      81.7 ml  RIGHT VENTRICLE RV Basal diam:  3.30 cm RV Mid diam:    3.50 cm RV S prime:     7.94 cm/s TAPSE (M-mode): 1.0 cm  LEFT ATRIUM             Index        RIGHT ATRIUM           Index LA diam:        3.30 cm 1.73 cm/m   RA Area:     15.40 cm LA Vol (A2C):   34.8 ml 18.20 ml/m  RA Volume:   40.40 ml  21.13 ml/m LA Vol (A4C):   28.2 ml 14.75 ml/m LA Biplane Vol: 31.9 ml 16.69 ml/m AORTIC VALVE                    PULMONIC VALVE AV Area (Vmax):    2.05 cm     PV Vmax:       1.08 m/s AV Area (Vmean):   2.11 cm  PV Peak grad:  4.7 mmHg AV Area (VTI):     2.22 cm AV Vmax:           134.00 cm/s AV Vmean:          87.467 cm/s AV VTI:            0.254 m AV Peak Grad:      7.2 mmHg AV Mean Grad:      3.7 mmHg LVOT Vmax:         96.70 cm/s LVOT Vmean:        65.133 cm/s LVOT VTI:          0.199 m LVOT/AV VTI ratio: 0.78  AORTA Ao Root diam: 3.10 cm  MITRAL VALVE               TRICUSPID VALVE MV Area (PHT): 4.89 cm    TR Peak grad:   12.2 mmHg MV Decel Time: 155 msec    TR Vmax:        175.00 cm/s MV E velocity: 80.00 cm/s MV A velocity: 58.30 cm/s  SHUNTS MV E/A ratio:  1.37        Systemic VTI:  0.20 m Systemic Diam: 1.90 cm  Dietrich Pates MD Electronically signed by Dietrich Pates MD Signature Date/Time: 04/26/2022/5:59:00 PM    Final   TEE  ECHO INTRAOPERATIVE TEE  04/19/2022  Narrative *INTRAOPERATIVE TRANSESOPHAGEAL REPORT *    Patient Name:   SKYLUR BEDNER     Date of Exam: 04/19/2022 Medical Rec #:  [id number]      Height:       66.0 in Accession #:    [id number]     Weight:       176.7 lb Date of Birth:  [birth year]       BSA:          1.90 m Patient Age:    76 years       BP:           144/72 mmHg Patient Gender: M              HR:           56 bpm. Exam Location:  Anesthesiology  Transesophogeal exam was perform intraoperatively during surgical procedure. Patient was closely monitored under general anesthesia during the entirety of examination.  Indications:     I25.110 Atherosclerotic heart disease of native coronary artery with unstable angina pectoris Performing Phys: Leslye Peer MD Diagnosing Phys: Leslye Peer MD  Complications: No known complications during this procedure. POST-OP IMPRESSIONS _ Left Ventricle: The left ventricle is unchanged from pre-bypass. _ Right Ventricle: The right ventricle appears unchanged from pre-bypass. _ Aorta: The aorta appears unchanged from pre-bypass. _ Left Atrium: The left atrium appears unchanged from pre-bypass. _ Left Atrial Appendage: The left atrial appendage appears unchanged from pre-bypass. _ Aortic Valve: The aortic valve appears unchanged from pre-bypass. _ Mitral Valve: The mitral valve appears unchanged from pre-bypass. _ Tricuspid Valve: The tricuspid valve appears unchanged from pre-bypass. _ Pulmonic Valve: The pulmonic valve appears unchanged from pre-bypass. _ Interatrial Septum: The interatrial septum appears unchanged from pre-bypass. _ Interventricular Septum: The interventricular septum appears unchanged from pre-bypass. _ Pericardium: The pericardium appears unchanged from pre-bypass.  PRE-OP FINDINGS Left Ventricle: The left ventricle has normal systolic function, with an ejection fraction of 55-60%. The cavity size was normal. No evidence of left ventricular regional wall  motion abnormalities. There is no left ventricular hypertrophy.   Right Ventricle:  The right ventricle has normal systolic function. The cavity was normal. There is no increase in right ventricular wall thickness.  Left Atrium: Left atrial size was normal in size.  Right Atrium: Right atrial size was normal in size.  Interatrial Septum: No atrial level shunt detected by color flow Doppler.  Pericardium: There is no evidence of pericardial effusion.  Mitral Valve: The mitral valve is normal in structure. Mitral valve regurgitation is not visualized by color flow Doppler. There is No evidence of mitral stenosis.  Tricuspid Valve: The tricuspid valve was normal in structure. Tricuspid valve regurgitation was not visualized by color flow Doppler. No evidence of tricuspid stenosis is present.  Aortic Valve: The aortic valve is tricuspid Aortic valve regurgitation was not visualized by color flow Doppler. There is no stenosis of the aortic valve.   Pulmonic Valve: The pulmonic valve was not assessed. Pulmonic valve regurgitation was not assessed by color flow Doppler.   Aorta: The aortic root, ascending aorta and aortic arch are normal in size and structure.   Leslye Peer MD Electronically signed by Leslye Peer MD Signature Date/Time: 04/19/2022/2:47:16 PM    Final                Recent Labs: 03/22/2022: NT-Pro BNP 71 04/18/2022: ALT 14 04/20/2022: Magnesium 2.0 04/30/2022: BUN 17; Creatinine, Ser 0.88; Potassium 4.3; Sodium 135 06/21/2022: Hemoglobin 11.6; Platelets 282  Recent Lipid Panel    Component Value Date/Time   CHOL 216 (H) 11/10/2021 0859   TRIG 202 (H) 11/10/2021 0859   HDL 58 11/10/2021 0859   CHOLHDL 3.7 11/10/2021 0859   CHOLHDL 3.1 07/21/2018 2254   VLDL 25 07/21/2018 2254   LDLCALC 123 (H) 11/10/2021 0859    Physical Exam:    VS:  There were no vitals taken for this visit.    Wt Readings from Last 3 Encounters:  01/19/23 176 lb (79.8 kg)  11/17/22  171 lb (77.6 kg)  09/08/22 171 lb (77.6 kg)     GEN: *** Well nourished, well developed in no acute distress HEENT: Normal NECK: No JVD; No carotid bruits LYMPHATICS: No lymphadenopathy CARDIAC: ***RRR, no murmurs, rubs, gallops RESPIRATORY:  Clear to auscultation without rales, wheezing or rhonchi  ABDOMEN: Soft, non-tender, non-distended MUSCULOSKELETAL:  No edema; No deformity  SKIN: Warm and dry NEUROLOGIC:  Alert and oriented x 3 PSYCHIATRIC:  Normal affect    Signed, Norman Herrlich, MD  01/31/2023 8:24 PM    [facility]

## 2023-02-01 ENCOUNTER — Encounter: Payer: Self-pay | Admitting: Cardiology

## 2023-02-01 ENCOUNTER — Ambulatory Visit: Payer: Medicare HMO | Attending: Cardiology | Admitting: Cardiology

## 2023-02-01 VITALS — BP 130/82 | HR 61 | Ht 66.0 in | Wt 174.4 lb

## 2023-02-01 DIAGNOSIS — I25110 Atherosclerotic heart disease of native coronary artery with unstable angina pectoris: Secondary | ICD-10-CM | POA: Diagnosis not present

## 2023-02-01 DIAGNOSIS — Z789 Other specified health status: Secondary | ICD-10-CM

## 2023-02-01 DIAGNOSIS — Z951 Presence of aortocoronary bypass graft: Secondary | ICD-10-CM

## 2023-02-01 DIAGNOSIS — I10 Essential (primary) hypertension: Secondary | ICD-10-CM

## 2023-02-01 DIAGNOSIS — G7000 Myasthenia gravis without (acute) exacerbation: Secondary | ICD-10-CM

## 2023-02-01 DIAGNOSIS — E782 Mixed hyperlipidemia: Secondary | ICD-10-CM | POA: Diagnosis not present

## 2023-02-01 NOTE — Patient Instructions (Signed)
Medication Instructions:  Your physician recommends that you continue on your current medications as directed. Please refer to the Current Medication list given to you today.  *If you need a refill on your cardiac medications before your next appointment, please call your pharmacy*   Lab Work: Your physician recommends that you return for lab work in:   Labs today: CMP, Lipids  If you have labs (blood work) drawn today and your tests are completely normal, you will receive your results only by: MyChart Message (if you have MyChart) OR A paper copy in the mail If you have any lab test that is abnormal or we need to change your treatment, we will call you to review the results.   Testing/Procedures: None   Follow-Up: At [facility], you and your health needs are our priority.  As part of our continuing mission to provide you with exceptional heart care, we have created designated Provider Care Teams.  These Care Teams include your primary Cardiologist (physician) and Advanced Practice Providers (APPs -  Physician Assistants and Nurse Practitioners) who all work together to provide you with the care you need, when you need it.  We recommend signing up for the patient portal called "MyChart".  Sign up information is provided on this After Visit Summary.  MyChart is used to connect with patients for Virtual Visits (Telemedicine).  Patients are able to view lab/test results, encounter notes, upcoming appointments, etc.  Non-urgent messages can be sent to your provider as well.   To learn more about what you can do with MyChart, go to [email or web address].    Your next appointment:   6 month(s)  Provider:   Brian Munley, MD    Other Instructions None  

## 2023-02-02 LAB — COMPREHENSIVE METABOLIC PANEL
ALT: 21 [IU]/L (ref 0–44)
AST: 20 [IU]/L (ref 0–40)
Albumin: 4.2 g/dL (ref 3.8–4.8)
Alkaline Phosphatase: 68 [IU]/L (ref 44–121)
BUN/Creatinine Ratio: 16 (ref 10–24)
BUN: 17 mg/dL (ref 8–27)
Bilirubin Total: <0.2 mg/dL (ref 0.0–1.2)
CO2: 24 mmol/L (ref 20–29)
Calcium: 9.5 mg/dL (ref 8.6–10.2)
Chloride: 104 mmol/L (ref 96–106)
Creatinine, Ser: 1.06 mg/dL (ref 0.76–1.27)
Globulin, Total: 2.1 g/dL (ref 1.5–4.5)
Glucose: 136 mg/dL — ABNORMAL HIGH (ref 70–99)
Potassium: 4.8 mmol/L (ref 3.5–5.2)
Sodium: 144 mmol/L (ref 134–144)
Total Protein: 6.3 g/dL (ref 6.0–8.5)
eGFR: 72 mL/min/{1.73_m2} (ref >59)

## 2023-02-02 LAB — LIPID PANEL
Chol/HDL Ratio: 4.4 {ratio} (ref 0.0–5.0)
Cholesterol, Total: 200 mg/dL — ABNORMAL HIGH (ref 100–199)
HDL: 45 mg/dL (ref >39)
LDL Chol Calc (NIH): 108 mg/dL — ABNORMAL HIGH (ref 0–99)
Triglycerides: 275 mg/dL — ABNORMAL HIGH (ref 0–149)
VLDL Cholesterol Cal: 47 mg/dL — ABNORMAL HIGH (ref 5–40)

## 2023-04-27 ENCOUNTER — Ambulatory Visit: Payer: Medicare HMO | Admitting: Neurology

## 2023-05-04 ENCOUNTER — Ambulatory Visit (INDEPENDENT_AMBULATORY_CARE_PROVIDER_SITE_OTHER): Admitting: Neurology

## 2023-05-04 ENCOUNTER — Encounter: Payer: Self-pay | Admitting: Neurology

## 2023-05-04 VITALS — BP 135/73 | HR 49 | Ht 66.0 in | Wt 173.0 lb

## 2023-05-04 DIAGNOSIS — G7000 Myasthenia gravis without (acute) exacerbation: Secondary | ICD-10-CM

## 2023-05-04 MED ORDER — PREDNISONE 5 MG PO TABS: 7.5000 mg | ORAL_TABLET | Freq: Every day | ORAL | 1 refills | Status: DC

## 2023-05-04 NOTE — Progress Notes (Signed)
 Follow-up Visit   Date: 05/04/23    Adrian Bell MRN: [id number] DOB: September 07, 1945   Interim History: Adrian Bell is a 78 y.o. right-handed Caucasian male with hypertension, kidney stones, and iron deficiency anemia returning to the clinic for follow-up of seropositive myasthenia gravis and neuropathy.   The patient was accompanied to the clinic by self.  IMPRESSION/PLAN: Seropositive bulbar and generalized myasthenia gravis without exacerbation.  Diagnosed 2019 s/p thymus resection in 2024.  He was doing very well on prednisone 10mg  until his CABG in February 2024 after which he has dysphagia, dysarthria, and weakness.  Since being on prednisone 30mg  symptoms have been controlled. He has been tolerating prednisone taper.  MG history:  - Highest dose of prednisone is 30mg /d in 2019   - Tapering prednisone < 5mg  caused breakthrough weakness, so he has been on 5mg /d since early 2022   - January 2023 - new onset of bulbar weakness, prednisone titrated to 30mg  daily  - April 2023 - IVIG started due to bulbar symptoms   - June 2023 - IVIG adjusted to every 3 weeks, clinically improved  - August 2023 - started prednisone taper   - November 2023 - IVIG stopped  - Jan - March 2024 - prednisone 10mg  daily  - Feb 2024  - s/p CABG and thymus resection, MG exacerbation  - April 2024 - prednisone increased to 20mg /d  - May 2024 - prednisone increased to 25mg /d  - June 2024 - prednisone increased to 30mg , symptoms improved   PLAN:   Reduce prednisone to 7.5mg  daily, will plan to stay on this dose during the summer months If he has breakthrough symptoms, increase prednisone back to 10mg  daily   2. Peripheral neuropathy with distal paresthesias and sensory ataxia, progression Continue supportive care  Return to clinic in 3 months  ------------------------------------------------------------------------------  UPDATE 03/17/2022:  He is here for follow-up visit.  He is doing great with respect  to MG and denies any double vision, droopiness of the eyelids, difficulty swallowing/talking, or limb weakness.  He has been able to taper prednisone down to 10mg  daily and remains off IVIG.  He has been having shortness of breath with exertion and some chest pressure, which has limited his activity level.  He is no longer doing yard work.   He has not scheduled an appointment with cardiology, but plans to do this.  Neuropathy is unchanged and he has noticed mild increased in unsteadiness/imbalance.  No falls.  He continues to walk independently.   UPDATE 06/16/2022: He was doing well with MG until February.  He underwent 3v CABG in February 2024.   Immediately post-op, he began having difficulty with swallowing, speech, spitting, and arm/leg weakness.  He was having difficulty moving his food in his mouth, initially having to manually use his fingers to get food out of the corners of his cheek.  His prednisone was increased to 20mg  daily last week and he has noticed mild improvement.  His swallowing and speech is better.   He continues to have mild slurred speech and bilateral leg weakness.  He denies shortness of breath and is able to lay on one pillow.    UPDATE 07/05/2022:  He has been on prednisone 20mg  for about month and reports speech, swallow, and generalized weakness has improved, but he still has some slightly difficulty when tired or after talking for longer periods.  He no longer has difficulty with spitting.  He has noticed increased numbness in the feet and  balance.   UPDATE 09/08/2022:  He is here for follow-up.  In June, he increased prednisone to 30mg  which improved speech, swallow, and weakness.  He has trace difficulty with spitting and swallowing.    Starting earlier this year, he began noticing having bilateral index finger weakness, which he noticed when trying to dry his ears or in between his toes.  He states the fingers will flex inwards.  No problems with opening jars/bottles.  No  neck pain.  UPDATE 11/17/2022:   He has been compliant with taking prednisone 30mg  daily.    UPDATE 01/19/2023:  He is here for follow-up with his wife.  He has been able to reduce prednisone to 20mg  daily and doing well.  No problems with ptosis, double vision, limb weakness, or problems with speech/swallow. Neuropathy is stable with numbness and remain from the mid-calf down in the feet. Balance is good. No falls.   UPDATE 05/04/2023:  He is here for follow-up visit.  He tapered prednisone to 10mg  daily on 12/28 and had been doing well.  He denies problems with speech/swallow, double vision, or ptosis.  No new complaints.   Medications:  Current Outpatient Medications on File Prior to Visit  Medication Sig Dispense Refill   acetaminophen (TYLENOL) 325 MG tablet Take 2 tablets (650 mg total) by mouth every 4 (four) hours as needed for fever or mild pain.     Calcium Carb-Cholecalciferol (CALCIUM 600 + D PO) Take 1 tablet by mouth daily.     Calcium Carbonate-Vit D-Min (CALCIUM 1200 PO) Take 1 tablet by mouth daily.     clopidogrel (PLAVIX) 75 MG tablet Take 1 tablet (75 mg total) by mouth daily. 90 tablet 3   cyanocobalamin (VITAMIN B12) 1000 MCG tablet Take 1,000 mcg by mouth daily.     ezetimibe (ZETIA) 10 MG tablet Take by mouth.     ferrous gluconate (FERGON) 324 MG tablet Take 1 tablet by mouth 3 (three) times a week. Monday, Wednesday AND Friday     metoprolol tartrate (LOPRESSOR) 25 MG tablet Take 1 tablet (25 mg total) by mouth daily. 90 tablet 3   nitroGLYCERIN (NITROSTAT) 0.4 MG SL tablet Place 1 tablet (0.4 mg total) under the tongue every 5 (five) minutes as needed for chest pain. 25 tablet 1   Pitavastatin Calcium (LIVALO) 2 MG TABS Take 2 mg by mouth 2 (two) times a week.     ramipril (ALTACE) 5 MG capsule Take 5 mg by mouth daily.     traMADol (ULTRAM) 50 MG tablet Take 1 tablet (50 mg total) by mouth every 6 (six) hours as needed for moderate pain. 30 tablet 0   No current  facility-administered medications on file prior to visit.    Allergies:  Allergies  Allergen Reactions   Zetia [Ezetimibe] Other (See Comments)    Weakness, shortness of breath   Demerol [Meperidine Hcl]    Statins Other (See Comments)    myalgias    Vital Signs:  BP 135/73   Pulse (!) 49   Ht 5\' 6"  (1.676 m)   Wt 173 lb (78.5 kg)   SpO2 98%   BMI 27.92 kg/m   Neurological Exam: MENTAL STATUS including orientation to time, place, person, recent and remote memory, attention span and concentration, language, and fund of knowledge is normal.  There is no dysarthria.  CRANIAL NERVES:  Pupils equal round and reactive to light.  Normal conjugate, extra-ocular eye movements in all directions of gaze. Trace left ptosis.  Face is symmetric.  Facial muscles are 5/5 with orbicularis oculi and buccinator testing.    MOTOR:  Motor strength is 5/5 throughout. No fatigability.  SENSORY:  Reduced vibration at the ankles  COORDINATION/GAIT:   Gait narrow based and stable. Unassisted.  Data: CT head 12/08/2016:  Unremarkable  Labs 04/20/2017:   AChR binding 9.20*, blocking 76*, and modulating 90*  CT chest wo contrast 05/04/2017:   1. Unremarkable CT examination the chest. No anterior mediastinal mass/thymoma is identified. 2. No acute pulmonary findings or worrisome pulmonary lesions. 3. Significant three-vessel coronary artery calcifications. 4. Cholelithiasis.  Labs received 06/29/2017:  TPMT  13 (normal).  Lab Results  Component Value Date   VITAMINB12 259 04/16/2022     Thank you for allowing me to participate in patient's care.  If I can answer any additional questions, I would be pleased to do so.    Sincerely,    Calhoun Reichardt K. Allena Katz, DO

## 2023-05-04 NOTE — Patient Instructions (Signed)
 Reduce prednisone to 7.5 mg daily

## 2023-06-27 ENCOUNTER — Other Ambulatory Visit: Payer: Self-pay | Admitting: Cardiology

## 2023-08-08 ENCOUNTER — Telehealth: Payer: Self-pay | Admitting: Cardiology

## 2023-08-08 NOTE — Telephone Encounter (Signed)
 Leg pain where artery was taken out for leg for bypass

## 2023-08-08 NOTE — Telephone Encounter (Signed)
 Pt states that area is red, swollen, hot to touch and painful. Advised to go to the ED. Pt is agreeable.

## 2023-08-10 ENCOUNTER — Encounter: Payer: Self-pay | Admitting: Neurology

## 2023-08-10 ENCOUNTER — Ambulatory Visit: Admitting: Neurology

## 2023-08-10 VITALS — BP 128/71 | HR 70 | Ht 66.0 in | Wt 169.0 lb

## 2023-08-10 DIAGNOSIS — G7001 Myasthenia gravis with (acute) exacerbation: Secondary | ICD-10-CM

## 2023-08-10 MED ORDER — PREDNISONE 10 MG PO TABS: 10.0000 mg | ORAL_TABLET | Freq: Every day | ORAL | 1 refills | Status: DC

## 2023-08-10 NOTE — Patient Instructions (Addendum)
 Increase prednisone  10mg  daily  Call with update in 2-3 weeks.  If your symptoms are not getting better, then we may need to increase prednisone  to 20mg  daily.   We will request for your labs from [facility]

## 2023-08-10 NOTE — Progress Notes (Signed)
 Follow-up Visit   Date: 08/10/23    Adrian Bell MRN: [id number] DOB: [birth year]   Interim History: Adrian Bell is a 78 y.o. right-handed Caucasian male with hypertension, kidney stones, and iron deficiency anemia returning to the clinic for follow-up of seropositive myasthenia gravis and neuropathy.   The patient was accompanied to the clinic by self.  IMPRESSION/PLAN: Seropositive bulbar and generalized myasthenia gravis with mild exacerbation.  Diagnosed 2019 s/p thymus resection in 2024.  He was doing very well on prednisone  10mg  until his CABG in February 2024 after which he has dysphagia, dysarthria, and weakness.  Since being on prednisone  30mg  symptoms have been controlled. He has been tolerating prednisone  taper.  She has been on prednisone  7.5mg , however started having breakthrough symptoms over the past two weeks.  Today, I also discussed steroid sparing medications since he tends to always have worsening symptoms during the summer months.  He will think about this.  MG history:  - Highest dose of prednisone  is 30mg /d in 2019 and 2024  - Tapering prednisone  < 5mg  caused breakthrough weakness, so he has been on 5mg /d since early 2022   - January 2023 - new onset of bulbar weakness, prednisone  titrated to 30mg  daily  - April 2023 - IVIG started due to bulbar symptoms   - June 2023 - IVIG adjusted to every 3 weeks, clinically improved  - August 2023 - started prednisone  taper   - November 2023 - IVIG stopped  - Jan - March 2024 - prednisone  10mg  daily  - Feb 2024  - s/p CABG and thymus resection, MG exacerbation  - April 2024 - prednisone  increased to 20mg /d  - May 2024 - prednisone  increased to 25mg /d  - June 2024 - prednisone  increased to 30mg , symptoms improved  - June 2025 - prednisone  tapered down to 7.5mg , but reporting mild dysphagia   PLAN:   Increase prednisone  to 10mg  daily Call with update in 2-3 week to determine if prednisone  needs to be increased to 20mg   daily Request labs from [facility]  2. Peripheral neuropathy with distal paresthesias and sensory ataxia, progression Continue supportive care  Return to clinic in 3 months  ------------------------------------------------------------------------------  UPDATE 03/17/2022:  He is here for follow-up visit.  He is doing great with respect to MG and denies any double vision, droopiness of the eyelids, difficulty swallowing/talking, or limb weakness.  He has been able to taper prednisone  down to 10mg  daily and remains off IVIG.  He has been having shortness of breath with exertion and some chest pressure, which has limited his activity level.  He is no longer doing yard work.   He has not scheduled an appointment with cardiology, but plans to do this.  Neuropathy is unchanged and he has noticed mild increased in unsteadiness/imbalance.  No falls.  He continues to walk independently.   UPDATE 06/16/2022: He was doing well with MG until February.  He underwent 3v CABG in February 2024.   Immediately post-op, he began having difficulty with swallowing, speech, spitting, and arm/leg weakness.  He was having difficulty moving his food in his mouth, initially having to manually use his fingers to get food out of the corners of his cheek.  His prednisone  was increased to 20mg  daily last week and he has noticed mild improvement.  His swallowing and speech is better.   He continues to have mild slurred speech and bilateral leg weakness.  He denies shortness of breath and is able to lay on one  pillow.    UPDATE 07/05/2022:  He has been on prednisone  20mg  for about month and reports speech, swallow, and generalized weakness has improved, but he still has some slightly difficulty when tired or after talking for longer periods.  He no longer has difficulty with spitting.  He has noticed increased numbness in the feet and balance.   UPDATE 09/08/2022:  He is here for follow-up.  In June, he increased prednisone  to  30mg  which improved speech, swallow, and weakness.  He has trace difficulty with spitting and swallowing.    Starting earlier this year, he began noticing having bilateral index finger weakness, which he noticed when trying to dry his ears or in between his toes.  He states the fingers will flex inwards.  No problems with opening jars/bottles.  No neck pain.  UPDATE 11/17/2022:   He has been compliant with taking prednisone  30mg  daily.    UPDATE 01/19/2023:  He is here for follow-up with his wife.  He has been able to reduce prednisone  to 20mg  daily and doing well.  No problems with ptosis, double vision, limb weakness, or problems with speech/swallow. Neuropathy is stable with numbness and remain from the mid-calf down in the feet. Balance is good. No falls.   UPDATE 05/04/2023:  He is here for follow-up visit.  He tapered prednisone  to 10mg  daily on 12/28 and had been doing well.  He denies problems with speech/swallow, double vision, or ptosis.  No new complaints.   UPDATE 08/10/2023:  About two weeks ago, he began having slurred speech and intermittent difficulty with spitting out his tooth paste and droopiness of the left eyelid. His speech is worse with prolonged talking and later in the day.   He is taking cephalexin for right leg cellulitis.  He has not noticed any new symptoms since starting the medication.  No double vision, difficulty with swallowing, or limb weakness.   Medications:  Current Outpatient Medications on File Prior to Visit  Medication Sig Dispense Refill   acetaminophen  (TYLENOL ) 325 MG tablet Take 2 tablets (650 mg total) by mouth every 4 (four) hours as needed for fever or mild pain.     Calcium  Carb-Cholecalciferol (CALCIUM  600 + D PO) Take 1 tablet by mouth daily.     Calcium  Carbonate-Vit D-Min (CALCIUM  1200 PO) Take 1 tablet by mouth daily.     cephALEXin (KEFLEX) 500 MG capsule Take 500 mg by mouth 2 (two) times daily.     clopidogrel  (PLAVIX ) 75 MG tablet TAKE 1  TABLET (75 MG TOTAL) BY MOUTH DAILY. 90 tablet 3   cyanocobalamin (VITAMIN B12) 1000 MCG tablet Take 1,000 mcg by mouth daily.     ezetimibe  (ZETIA ) 10 MG tablet Take by mouth.     ferrous gluconate (FERGON) 324 MG tablet Take 1 tablet by mouth 3 (three) times a week. Monday, Wednesday AND Friday     metoprolol  tartrate (LOPRESSOR ) 25 MG tablet Take 1 tablet (25 mg total) by mouth daily. 90 tablet 3   nitroGLYCERIN  (NITROSTAT ) 0.4 MG SL tablet Place 1 tablet (0.4 mg total) under the tongue every 5 (five) minutes as needed for chest pain. 25 tablet 1   Pitavastatin  Calcium  (LIVALO ) 2 MG TABS Take 2 mg by mouth 2 (two) times a week.     predniSONE  (DELTASONE ) 5 MG tablet Take 1.5 tablets (7.5 mg total) by mouth daily with breakfast. 135 tablet 1   ramipril  (ALTACE ) 5 MG capsule Take 5 mg by mouth daily.  traMADol  (ULTRAM ) 50 MG tablet Take 1 tablet (50 mg total) by mouth every 6 (six) hours as needed for moderate pain. 30 tablet 0   No current facility-administered medications on file prior to visit.    Allergies:  Allergies  Allergen Reactions   Zetia  [Ezetimibe ] Other (See Comments)    Weakness, shortness of breath   Demerol [Meperidine Hcl]    Statins Other (See Comments)    myalgias    Vital Signs:  BP 128/71   Pulse 70   Ht 5' 6 (1.676 m)   Wt 169 lb (76.7 kg)   SpO2 98%   BMI 27.28 kg/m   Neurological Exam: MENTAL STATUS including orientation to time, place, person, recent and remote memory, attention span and concentration, language, and fund of knowledge is normal.  There is no dysarthria.  CRANIAL NERVES:  Pupils equal round and reactive to light.  Normal conjugate, extra-ocular eye movements in all directions of gaze. Trace left ptosis.  Face is symmetric.  Facial muscles are 5/5 with orbicularis oculi and buccinator testing.    MOTOR:  Motor strength is 5/5 throughout. No fatigability.  SENSORY:  Reduced vibration at the ankles  COORDINATION/GAIT:   Gait narrow  based and stable. Unassisted.  Data: CT head 12/08/2016:  Unremarkable  Labs 04/20/2017:   AChR binding 9.20*, blocking 76*, and modulating 90*  CT chest wo contrast 05/04/2017:   1. Unremarkable CT examination the chest. No anterior mediastinal mass/thymoma is identified. 2. No acute pulmonary findings or worrisome pulmonary lesions. 3. Significant three-vessel coronary artery calcifications. 4. Cholelithiasis.  Labs received 06/29/2017:  TPMT  13 (normal).  Lab Results  Component Value Date   VITAMINB12 259 04/16/2022     Thank you for allowing me to participate in patient's care.  If I can answer any additional questions, I would be pleased to do so.    Sincerely,    Aayan Haskew K. Lydia Sams, DO

## 2023-08-11 ENCOUNTER — Ambulatory Visit: Admitting: Cardiology

## 2023-08-31 ENCOUNTER — Telehealth: Payer: Self-pay | Admitting: Neurology

## 2023-08-31 NOTE — Telephone Encounter (Signed)
 Pt. Calling as his Myasthenia symptoms are a little worse and need Prednisone  dosage increase call back

## 2023-08-31 NOTE — Telephone Encounter (Signed)
 Called patient and he said its the same symptoms but little worse. Symptoms- talking, spitting toothpaste, and left eye droopy.  Patient is aware I will send Dr. [name] a message a give him a call back as soon as I hear from Dr. [name]. Patient is aware to go to ER if symptoms worsen or any new symptoms occur. Patient verbalized understanding and had no further questions or concerns.

## 2023-09-01 ENCOUNTER — Telehealth: Payer: Self-pay | Admitting: Neurology

## 2023-09-01 NOTE — Telephone Encounter (Signed)
 See other encounter.

## 2023-09-01 NOTE — Telephone Encounter (Signed)
 Pt calling back Mahina

## 2023-09-01 NOTE — Telephone Encounter (Signed)
 Called patient and left a message for a call back.

## 2023-09-01 NOTE — Telephone Encounter (Signed)
 Please advise him to increase prednisone  20mg  daily and call with update in 2 weeks.

## 2023-09-01 NOTE — Telephone Encounter (Signed)
 Called patient and informed him of Dr. Anthony recommendations. Patient verbalized understanding and will call us  with an update in 2 weeks. I did ask how patient has been doing today and patient stated he is doing good today. Also, patient has enough prednisone  and does not need any sent at this time. Patient verbalized all explained to him and had no further questions or concerns.

## 2023-09-28 ENCOUNTER — Ambulatory Visit: Admitting: Cardiology

## 2023-10-19 ENCOUNTER — Other Ambulatory Visit: Payer: Self-pay

## 2023-10-19 DIAGNOSIS — G7000 Myasthenia gravis without (acute) exacerbation: Secondary | ICD-10-CM | POA: Insufficient documentation

## 2023-10-20 NOTE — Progress Notes (Unsigned)
 Cardiology Office Note   Date:  10/21/2023  ID:  Adrian Bell, DOB [birth year], MRN [id number] PCP: Adrian Santana CROME, MD  [facility] Providers Cardiologist:  Redell Leiter, MD     History of Present Illness Adrian Bell is a 78 y.o. male with a past medical history of CAD s/p CABG x 3, hypertension, myasthenia gravis s/p thymectomy, dyslipidemia, RBBB  04/19/2022 CABG x 3 and thymus resection, LAA closure 07/21/2018 left heart cath mid RCA 100% stenosed >> DES to mid RCA, 50% stenosis to proximal LAD and 75% stenosis distal LAD  He is an established patient of Dr. Leandrew since 2020, had a history of inferior STEMI with PCI and DES to his RCA on 07/21/2018.  He also had a history of myasthenia gravis with bulbar symptomology.  In February 2024 he presented to [facility] emergency department with left-sided chest pain >> transferred to [facility] for unstable angina, he underwent thymectomy, CABG x 3 with LIMA to LAD, SVG to OM with endarterectomy and SVG to distal RCA, he tolerated the procedure well but did develop a mild ileus but further operatively, and subsequently developed a fever, blood cultures revealed Enterobacter.  Most recently was evaluated by Dr. Leiter on 02/01/2023, he was dealing with excessive bruising and the decision was made to continue with monotherapy regarding his antiplatelet, there was some discussion about a PCSK9 inhibitor however he was not amenable to this.  No changes were made to his medications or plan of care and he was advised he can follow-up in 6 months.  He presents today for follow-up of his CAD.  He has been doing well since he was last evaluated in our office, mentions he has been cutting down trees using an ax and has tolerated this without any anginal complaints.  He was most recently evaluated at the [facility] on October 10, 2023--sees them twice a year Dr. Santana Adrian as his PCP there, routine lab work was overall okay with the exception of his LDL that  was elevated at 94 and it was suggested he start on Zetia  which he has not received in the mail yet. He denies chest pain, palpitations, dyspnea, pnd, orthopnea, n, v, dizziness, syncope, edema, weight gain, or early satiety.     ROS: Review of Systems  Endo/Heme/Allergies:  Bruises/bleeds easily.  All other systems reviewed and are negative.    Studies Reviewed EKG Interpretation Date/Time:  Friday October 21 2023 09:25:51 EDT Ventricular Rate:  48 PR Interval:  134 QRS Duration:  140 QT Interval:  446 QTC Calculation: 398 R Axis:   183  Text Interpretation: Sinus bradycardia Right bundle branch block Inferior infarct , age undetermined When compared with ECG of 20-Apr-2022 06:45, Vent. rate has decreased BY  25 BPM Questionable change in QRS axis Inferior infarct is now Present Nonspecific T wave abnormality now evident in Anterior leads Confirmed by Carlin Nest [phone]) on 10/21/2023 9:31:14 AM    Cardiac Studies & Procedures   ______________________________________________________________________________________________ CARDIAC CATHETERIZATION  CARDIAC CATHETERIZATION 04/16/2022  Conclusion   Prox LAD to Mid LAD lesion is 75% stenosed.   Ost LAD to Prox LAD lesion is 75% stenosed.   Prox Cx to Mid Cx lesion is 50% stenosed.   Ost RCA to Prox RCA lesion is 70% stenosed.   Dist RCA lesion is 80% stenosed with 80% stenosed side branch in Ost RPDA.   Ost 1st Diag lesion is 100% stenosed.   Ost 2nd Diag to 2nd Diag lesion is 100%  stenosed.   Ost 1st Mrg lesion is 70% stenosed.   1st Mrg lesion is 99% stenosed.   Ost Cx to Prox Cx lesion is 70% stenosed.   Mid RCA stent is patent.   The left ventricular systolic function is normal.   LV end diastolic pressure is normal.   The left ventricular ejection fraction is 55-65% by visual estimate.   There is no aortic valve stenosis.  Severe calcific three-vessel coronary artery disease.  The change in anatomy from 2020 is the 99%  lesion in the OM.  There is proximal disease in the OM at the bifurcation with the circumflex.  Will get a surgical evaluation for CABG.  His symptom burden has been increasing of late so we will restart heparin  2 hours after the TR band has been removed.  Findings Coronary Findings Diagnostic  Dominance: Right  Left Anterior Descending There is mild diffuse disease throughout the vessel. Ost LAD to Prox LAD lesion is 75% stenosed. The lesion is eccentric. The lesion is severely calcified. Prox LAD to Mid LAD lesion is 75% stenosed.  First Diagonal Branch Ost 1st Diag lesion is 100% stenosed.  Second Diagonal [employer] 2nd Diag to 2nd Diag lesion is 100% stenosed.  Left Circumflex Ost Cx to Prox Cx lesion is 70% stenosed. The lesion is severely calcified. Prox Cx to Mid Cx lesion is 50% stenosed.  First Obtuse Marginal Branch Ost 1st Mrg lesion is 70% stenosed. The lesion is severely calcified. 1st Mrg lesion is 99% stenosed.  Right Coronary Artery Ost RCA to Prox RCA lesion is 70% stenosed. The lesion is severely calcified. Non-stenotic Mid RCA lesion was previously treated. The lesion is type C. The lesion is calcified. Dist RCA lesion is 80% stenosed with 80% stenosed side branch in Ost RPDA.  Right Posterior Descending Artery There is mild disease in the vessel.  Inferior Septal There is mild disease in the vessel.  Right Posterior Atrioventricular Artery There is mild disease in the vessel.  Intervention  No interventions have been documented.   CARDIAC CATHETERIZATION  CARDIAC CATHETERIZATION 12/08/2018  Conclusion  Ost LAD to Prox LAD lesion is 75% stenosed.  Mid LAD to Dist LAD lesion is 75% stenosed.  Ost 1st Mrg lesion is 70% stenosed.  Ost RCA to Prox RCA lesion is 70% stenosed.  Previously placed Mid RCA drug eluting stent is widely patent.  Ost 1st Diag lesion is 100% stenosed.  Ost 2nd Diag to 2nd Diag lesion is 100% stenosed.  Ost Cx to  Prox Cx lesion is 50% stenosed.  Prox Cx to Mid Cx lesion is 50% stenosed.  Dist RCA lesion is 80% stenosed with 80% stenosed side branch in Ost RPDA.  The left ventricular systolic function is normal.  LV end diastolic pressure is normal.  The left ventricular ejection fraction is 55-65% by visual estimate.  1. Complex 3 vessel CAD. He has severely calcified coronary arteries. - 70% ostial LAD with bulky, eccentric lesion - segmental 75% mid LAD - occluded small first and second diagonals - 50% ostial and mid LCx - 70% ostial large OM1 - 70% ostial RCA. 80% distal RCA at bifurcation 2. Normal LV function. 3. Normal LVEDP  Plan; will intensify medical therapy for angina adding Imdur  30 mg daily. If he continues to have angina I would recommend CT surgery consult for consideration of CABG  Findings Coronary Findings Diagnostic  Dominance: Right  Left Anterior Descending Ost LAD to Prox LAD lesion is 75% stenosed.  The lesion is eccentric. The lesion is severely calcified. Mid LAD to Dist LAD lesion is 75% stenosed.  First Diagonal Branch Ost 1st Diag lesion is 100% stenosed.  Second Diagonal [employer] 2nd Diag to 2nd Diag lesion is 100% stenosed.  Left Circumflex Ost Cx to Prox Cx lesion is 50% stenosed. The lesion is severely calcified. Prox Cx to Mid Cx lesion is 50% stenosed.  First Obtuse Marginal Branch Ost 1st Mrg lesion is 70% stenosed. The lesion is severely calcified.  Right Coronary Artery Ost RCA to Prox RCA lesion is 70% stenosed. The lesion is severely calcified. Previously placed Mid RCA drug eluting stent is widely patent. The lesion is type C. The lesion is calcified. Dist RCA lesion is 80% stenosed with 80% stenosed side branch in Ost RPDA.  Right Posterior Descending Artery There is mild disease in the vessel.  Inferior Septal There is mild disease in the vessel.  Right Posterior Atrioventricular Artery There is mild disease in the  vessel.  Intervention  No interventions have been documented.   STRESS TESTS  MYOCARDIAL PERFUSION IMAGING 04/22/2020  Interpretation Summary  The left ventricular ejection fraction is hyperdynamic (>65%).  Nuclear stress EF: 67%.  There was no ST segment deviation noted during stress.  No T wave inversion was noted during stress.  Defect 1: There is a small fixed defect of mild severity present in the apex location, with normal wall motion.  The study is normal. The small fixed defect as described above is likely a soft tissue attenuation.  This is a low risk study.   ECHOCARDIOGRAM  ECHOCARDIOGRAM COMPLETE BUBBLE STUDY 04/26/2022  Narrative ECHOCARDIOGRAM REPORT    Patient Name:   Adrian Bell Date of Exam: 04/26/2022 Medical Rec #:  [id number]  Height:       66.0 in Accession #:    [id number] Weight:       179.9 lb Date of Birth:  02-03-1946   BSA:          1.912 m Patient Age:    76 years   BP:           131/73 mmHg Patient Gender: M          HR:           65 bpm. Exam Location:  Inpatient  Procedure: 2D Echo, Saline Contrast Bubble Study and Intracardiac Opacification Agent  Indications:    pericardial effussion  History:        Patient has prior history of Echocardiogram examinations, most recent 03/23/2022. Acute MI and CAD, Signs/Symptoms:Chest Pain; Risk Factors:Hypertension, Dyslipidemia and Current Smoker.  Sonographer:    Sari Candy Referring Phys: SHERMON LONGS, R   Sonographer Comments: Technically difficult study due to poor echo windows, patient is obese and no subcostal window. Image acquisition challenging due to patient body habitus and supine and surgocal incisions. IMPRESSIONS   1. Left ventricular ejection fraction, by estimation, is 65 to 70%. The left ventricle has normal function. The left ventricle has no regional wall motion abnormalities. 2. Right ventricular systolic function was not well visualized. The right ventricular size is  normal. There is normal pulmonary artery systolic pressure. 3. A small pericardial effusion is present. 4. The mitral valve is normal in structure. Trivial mitral valve regurgitation. 5. The aortic valve is tricuspid. Aortic valve regurgitation is not visualized. Aortic valve sclerosis/calcification is present, without any evidence of aortic stenosis. 6. Agitated saline contrast bubble study was negative, with no evidence of any interatrial shunt.  Comparison(s): The left ventricular function is unchanged.  FINDINGS Left Ventricle: Left ventricular ejection fraction, by estimation, is 65 to 70%. The left ventricle has normal function. The left ventricle has no regional wall motion abnormalities. Definity  contrast agent was given IV to delineate the left ventricular endocardial borders. The left ventricular internal cavity size was normal in size. There is no left ventricular hypertrophy.  Right Ventricle: The right ventricular size is normal. Right vetricular wall thickness was not assessed. Right ventricular systolic function was not well visualized. There is normal pulmonary artery systolic pressure. The tricuspid regurgitant velocity is 1.75 m/s, and with an assumed right atrial pressure of 3 mmHg, the estimated right ventricular systolic pressure is 15.2 mmHg.  Left Atrium: Left atrial size was normal in size.  Right Atrium: Right atrial size was normal in size.  Pericardium: A small pericardial effusion is present.  Mitral Valve: The mitral valve is normal in structure. Trivial mitral valve regurgitation.  Tricuspid Valve: The tricuspid valve is normal in structure. Tricuspid valve regurgitation is trivial.  Aortic Valve: The aortic valve is tricuspid. Aortic valve regurgitation is not visualized. Aortic valve sclerosis/calcification is present, without any evidence of aortic stenosis. Aortic valve mean gradient measures 3.7 mmHg. Aortic valve peak gradient measures 7.2 mmHg. Aortic  valve area, by VTI measures 2.22 cm.  Pulmonic Valve: The pulmonic valve was not well visualized. Pulmonic valve regurgitation is not visualized. No evidence of pulmonic stenosis.  Aorta: The aortic root is normal in size and structure.  IAS/Shunts: No atrial level shunt detected by color flow Doppler. Agitated saline contrast was given intravenously to evaluate for intracardiac shunting. Agitated saline contrast bubble study was negative, with no evidence of any interatrial shunt.   LEFT VENTRICLE PLAX 2D LVIDd:         5.20 cm      Diastology LVIDs:         3.50 cm      LV e' medial:    5.77 cm/s LV PW:         1.00 cm      LV E/e' medial:  13.9 LV IVS:        0.80 cm      LV e' lateral:   6.31 cm/s LVOT diam:     1.90 cm      LV E/e' lateral: 12.7 LV SV:         56 LV SV Index:   30 LVOT Area:     2.84 cm  LV Volumes (MOD) LV vol d, MOD A2C: 121.0 ml LV vol d, MOD A4C: 140.0 ml LV vol s, MOD A2C: 42.9 ml LV vol s, MOD A4C: 62.6 ml LV SV MOD A2C:     78.1 ml LV SV MOD A4C:     140.0 ml LV SV MOD BP:      81.7 ml  RIGHT VENTRICLE RV Basal diam:  3.30 cm RV Mid diam:    3.50 cm RV S prime:     7.94 cm/s TAPSE (M-mode): 1.0 cm  LEFT ATRIUM             Index        RIGHT ATRIUM           Index LA diam:        3.30 cm 1.73 cm/m   RA Area:     15.40 cm LA Vol (A2C):   34.8 ml 18.20 ml/m  RA Volume:   40.40 ml  21.13 ml/m  LA Vol (A4C):   28.2 ml 14.75 ml/m LA Biplane Vol: 31.9 ml 16.69 ml/m AORTIC VALVE                    PULMONIC VALVE AV Area (Vmax):    2.05 cm     PV Vmax:       1.08 m/s AV Area (Vmean):   2.11 cm     PV Peak grad:  4.7 mmHg AV Area (VTI):     2.22 cm AV Vmax:           134.00 cm/s AV Vmean:          87.467 cm/s AV VTI:            0.254 m AV Peak Grad:      7.2 mmHg AV Mean Grad:      3.7 mmHg LVOT Vmax:         96.70 cm/s LVOT Vmean:        65.133 cm/s LVOT VTI:          0.199 m LVOT/AV VTI ratio: 0.78  AORTA Ao Root diam: 3.10  cm  MITRAL VALVE               TRICUSPID VALVE MV Area (PHT): 4.89 cm    TR Peak grad:   12.2 mmHg MV Decel Time: 155 msec    TR Vmax:        175.00 cm/s MV E velocity: 80.00 cm/s MV A velocity: 58.30 cm/s  SHUNTS MV E/A ratio:  1.37        Systemic VTI:  0.20 m Systemic Diam: 1.90 cm  Vina Gull MD Electronically signed by Vina Gull MD Signature Date/Time: 04/26/2022/5:59:00 PM    Final   TEE  ECHO INTRAOPERATIVE TEE 04/19/2022  Narrative *INTRAOPERATIVE TRANSESOPHAGEAL REPORT *    Patient Name:   Adrian Bell     Date of Exam: 04/19/2022 Medical Rec #:  [id number]      Height:       66.0 in Accession #:    [id number]     Weight:       176.7 lb Date of Birth:  Jul 03, 1945       BSA:          1.90 m Patient Age:    76 years       BP:           144/72 mmHg Patient Gender: M              HR:           56 bpm. Exam Location:  Anesthesiology  Transesophogeal exam was perform intraoperatively during surgical procedure. Patient was closely monitored under general anesthesia during the entirety of examination.  Indications:     I25.110 Atherosclerotic heart disease of native coronary artery with unstable angina pectoris Performing Phys: Debby Like MD Diagnosing Phys: Debby Like MD  Complications: No known complications during this procedure. POST-OP IMPRESSIONS _ Left Ventricle: The left ventricle is unchanged from pre-bypass. _ Right Ventricle: The right ventricle appears unchanged from pre-bypass. _ Aorta: The aorta appears unchanged from pre-bypass. _ Left Atrium: The left atrium appears unchanged from pre-bypass. _ Left Atrial Appendage: The left atrial appendage appears unchanged from pre-bypass. _ Aortic Valve: The aortic valve appears unchanged from pre-bypass. _ Mitral Valve: The mitral valve appears unchanged from pre-bypass. _ Tricuspid Valve: The tricuspid valve appears unchanged from pre-bypass. _ Pulmonic Valve: The pulmonic valve appears unchanged from  pre-bypass. _ Interatrial Septum: The interatrial septum appears unchanged from pre-bypass. _ Interventricular Septum: The interventricular septum appears unchanged from pre-bypass. _ Pericardium: The pericardium appears unchanged from pre-bypass.  PRE-OP FINDINGS Left Ventricle: The left ventricle has normal systolic function, with an ejection fraction of 55-60%. The cavity size was normal. No evidence of left ventricular regional wall motion abnormalities. There is no left ventricular hypertrophy.   Right Ventricle: The right ventricle has normal systolic function. The cavity was normal. There is no increase in right ventricular wall thickness.  Left Atrium: Left atrial size was normal in size.  Right Atrium: Right atrial size was normal in size.  Interatrial Septum: No atrial level shunt detected by color flow Doppler.  Pericardium: There is no evidence of pericardial effusion.  Mitral Valve: The mitral valve is normal in structure. Mitral valve regurgitation is not visualized by color flow Doppler. There is No evidence of mitral stenosis.  Tricuspid Valve: The tricuspid valve was normal in structure. Tricuspid valve regurgitation was not visualized by color flow Doppler. No evidence of tricuspid stenosis is present.  Aortic Valve: The aortic valve is tricuspid Aortic valve regurgitation was not visualized by color flow Doppler. There is no stenosis of the aortic valve.   Pulmonic Valve: The pulmonic valve was not assessed. Pulmonic valve regurgitation was not assessed by color flow Doppler.   Aorta: The aortic root, ascending aorta and aortic arch are normal in size and structure.   Debby Like MD Electronically signed by Debby Like MD Signature Date/Time: 04/19/2022/2:47:16 PM    Final        ______________________________________________________________________________________________      Risk Assessment/Calculations           Physical Exam VS:  BP 120/60    Pulse (!) 48   Ht 5' 6 (1.676 m)   Wt 168 lb (76.2 kg)   SpO2 99%   BMI 27.12 kg/m        Wt Readings from Last 3 Encounters:  10/21/23 168 lb (76.2 kg)  08/10/23 169 lb (76.7 kg)  05/04/23 173 lb (78.5 kg)    GEN: Well nourished, well developed in no acute distress NECK: No JVD; No carotid bruits CARDIAC: RRR, no murmurs, rubs, gallops RESPIRATORY:  Clear to auscultation without rales, wheezing or rhonchi  ABDOMEN: Soft, non-tender, non-distended EXTREMITIES:  No edema; No deformity   ASSESSMENT AND PLAN CAD -s/p CABG, Stable with no anginal symptoms. No indication for ischemic evaluation.  Recommendations were to continue DAPT indefinitely however he has had pretty severe bruising and he is maintained on Plavix  75 mg daily.  Continue nitroglycerin  as needed.  Myasthenia gravis-s/p thymectomy, on prednisone   Dyslipidemia with intolerance to statins-currently on pitavastatin  -- [facility] with recent addition of Zetia .   Hypertension-blood pressure is well-controlled 120/60 continue Altace  5 milligrams daily.  RBBB -  noted in 2020         Dispo: Follow-up in 6 months.  Signed, Delon JAYSON Hoover, NP

## 2023-10-21 ENCOUNTER — Telehealth: Payer: Self-pay | Admitting: Cardiology

## 2023-10-21 ENCOUNTER — Ambulatory Visit: Attending: Cardiology | Admitting: Cardiology

## 2023-10-21 ENCOUNTER — Encounter: Payer: Self-pay | Admitting: Cardiology

## 2023-10-21 VITALS — BP 120/60 | HR 48 | Ht 66.0 in | Wt 168.0 lb

## 2023-10-21 DIAGNOSIS — G7000 Myasthenia gravis without (acute) exacerbation: Secondary | ICD-10-CM | POA: Diagnosis not present

## 2023-10-21 DIAGNOSIS — I25110 Atherosclerotic heart disease of native coronary artery with unstable angina pectoris: Secondary | ICD-10-CM | POA: Diagnosis not present

## 2023-10-21 DIAGNOSIS — Z789 Other specified health status: Secondary | ICD-10-CM

## 2023-10-21 DIAGNOSIS — E782 Mixed hyperlipidemia: Secondary | ICD-10-CM

## 2023-10-21 DIAGNOSIS — I10 Essential (primary) hypertension: Secondary | ICD-10-CM

## 2023-10-21 DIAGNOSIS — I4510 Unspecified right bundle-branch block: Secondary | ICD-10-CM

## 2023-10-21 DIAGNOSIS — Z951 Presence of aortocoronary bypass graft: Secondary | ICD-10-CM | POA: Diagnosis not present

## 2023-10-21 DIAGNOSIS — D485 Neoplasm of uncertain behavior of skin: Secondary | ICD-10-CM | POA: Insufficient documentation

## 2023-10-21 MED ORDER — METOPROLOL SUCCINATE ER 50 MG PO TB24: 50.0000 mg | ORAL_TABLET | Freq: Every day | ORAL | Status: AC

## 2023-10-21 NOTE — Addendum Note (Signed)
 Addended by: ONEITA BERLINER on: 10/21/2023 01:51 PM   Modules accepted: Orders

## 2023-10-21 NOTE — Telephone Encounter (Signed)
 Recommendations reviewed with pt as per [facility] note.  Pt verbalized understanding and had no additional questions. Pt states that his RX is Metoprolol  succinate 50 mg daily. Med list has been updated.

## 2023-10-21 NOTE — Telephone Encounter (Signed)
 Left voice message to return call

## 2023-10-21 NOTE — Patient Instructions (Signed)
 Medication Instructions:  Your physician recommends that you continue on your current medications as directed. Please refer to the Current Medication list given to you today.  *If you need a refill on your cardiac medications before your next appointment, please call your pharmacy*  Lab Work: NONE If you have labs (blood work) drawn today and your tests are completely normal, you will receive your results only by: MyChart Message (if you have MyChart) OR A paper copy in the mail If you have any lab test that is abnormal or we need to change your treatment, we will call you to review the results.  Testing/Procedures: NONE  Follow-Up: At [facility], you and your health needs are our priority.  As part of our continuing mission to provide you with exceptional heart care, our providers are all part of one team.  This team includes your primary Cardiologist (physician) and Advanced Practice Providers or APPs (Physician Assistants and Nurse Practitioners) who all work together to provide you with the care you need, when you need it.  Your next appointment:   6 month(s)  Provider:   Norman Herrlich, MD    We recommend signing up for the patient portal called "MyChart".  Sign up information is provided on this After Visit Summary.  MyChart is used to connect with patients for Virtual Visits (Telemedicine).  Patients are able to view lab/test results, encounter notes, upcoming appointments, etc.  Non-urgent messages can be sent to your provider as well.   To learn more about what you can do with MyChart, go to [email or web address].   Other Instructions

## 2023-10-21 NOTE — Telephone Encounter (Signed)
 He should be on Metoprolol  Succinate, which is what he said he was on but our list has Tartrate.   Preference with his hx of myastenia gravis is to be on Succinate, 25 mg daily.   He sees the [facility] for his primary needs, so its possible something got mixed up on our end or theirs along the way.

## 2023-11-09 ENCOUNTER — Ambulatory Visit (INDEPENDENT_AMBULATORY_CARE_PROVIDER_SITE_OTHER): Admitting: Neurology

## 2023-11-09 ENCOUNTER — Encounter: Payer: Self-pay | Admitting: Neurology

## 2023-11-09 VITALS — BP 128/77 | HR 57 | Ht 66.0 in | Wt 171.0 lb

## 2023-11-09 DIAGNOSIS — G7000 Myasthenia gravis without (acute) exacerbation: Secondary | ICD-10-CM | POA: Diagnosis not present

## 2023-11-09 NOTE — Progress Notes (Signed)
 Follow-up Visit   Date: 11/09/23    Adrian Bell MRN: [id number] DOB: 1945-08-28   Interim History: Adrian Bell is a 78 y.o. right-handed Caucasian male with hypertension, kidney stones, and iron deficiency anemia returning to the clinic for follow-up of seropositive myasthenia gravis and neuropathy.   The patient was accompanied to the clinic by self.  IMPRESSION/PLAN: Seropositive bulbar and generalized myasthenia gravis without exacerbation.  Diagnosed 2019 s/p thymus resection in 2024.   MG history:  - Highest dose of prednisone  is 30mg /d in 2019 and 2024  - Tapering prednisone  < 5mg  caused breakthrough weakness, so he has been on 5mg /d since early 2022   - January 2023 - new onset of bulbar weakness, prednisone  titrated to 30mg  daily  - April 2023 - IVIG started due to bulbar symptoms   - June 2023 - IVIG adjusted to every 3 weeks, clinically improved  - August 2023 - started prednisone  taper   - November 2023 - IVIG stopped  - Jan - March 2024 - prednisone  10mg  daily  - Feb 2024  - s/p CABG and thymus resection, MG exacerbation  - April 2024 - prednisone  increased to 20mg /d  - May 2024 - prednisone  increased to 25mg /d  - June 2024 - prednisone  increased to 30mg , symptoms improved  - June 2025 - prednisone  tapered down to 7.5mg , but reporting mild dysphagia  - July 2025 - prednisone  increased to 20mg /d   PLAN:   Reduce prednisone  to 15mg  daily Continue calcium  supplement Discussed steroid-sparing agents and opted to continue with prednisone  for now.  He is not having adverse side effects and is pleased with steroid effectiveness  2. Peripheral neuropathy with distal paresthesias and sensory ataxia, progression Continue supportive care  Return to clinic in 3 months  ------------------------------------------------------------------------------  UPDATE 03/17/2022:  He is here for follow-up visit.  He is doing great with respect to MG and denies any double vision,  droopiness of the eyelids, difficulty swallowing/talking, or limb weakness.  He has been able to taper prednisone  down to 10mg  daily and remains off IVIG.  He has been having shortness of breath with exertion and some chest pressure, which has limited his activity level.  He is no longer doing yard work.   He has not scheduled an appointment with cardiology, but plans to do this.  Neuropathy is unchanged and he has noticed mild increased in unsteadiness/imbalance.  No falls.  He continues to walk independently.   UPDATE 06/16/2022: He was doing well with MG until February.  He underwent 3v CABG in February 2024.   Immediately post-op, he began having difficulty with swallowing, speech, spitting, and arm/leg weakness.  He was having difficulty moving his food in his mouth, initially having to manually use his fingers to get food out of the corners of his cheek.  His prednisone  was increased to 20mg  daily last week and he has noticed mild improvement.  His swallowing and speech is better.   He continues to have mild slurred speech and bilateral leg weakness.  He denies shortness of breath and is able to lay on one pillow.    UPDATE 07/05/2022:  He has been on prednisone  20mg  for about month and reports speech, swallow, and generalized weakness has improved, but he still has some slightly difficulty when tired or after talking for longer periods.  He no longer has difficulty with spitting.  He has noticed increased numbness in the feet and balance.   UPDATE 09/08/2022:  He is here  for follow-up.  In June, he increased prednisone  to 30mg  which improved speech, swallow, and weakness.  He has trace difficulty with spitting and swallowing.    Starting earlier this year, he began noticing having bilateral index finger weakness, which he noticed when trying to dry his ears or in between his toes.  He states the fingers will flex inwards.  No problems with opening jars/bottles.  No neck pain.  UPDATE 11/17/2022:   He  has been compliant with taking prednisone  30mg  daily.    UPDATE 01/19/2023:  He is here for follow-up with his wife.  He has been able to reduce prednisone  to 20mg  daily and doing well.  No problems with ptosis, double vision, limb weakness, or problems with speech/swallow. Neuropathy is stable with numbness and remain from the mid-calf down in the feet. Balance is good. No falls.   UPDATE 05/04/2023:  He is here for follow-up visit.  He tapered prednisone  to 10mg  daily on 12/28 and had been doing well.  He denies problems with speech/swallow, double vision, or ptosis.  No new complaints.   UPDATE 08/10/2023:  About two weeks ago, he began having slurred speech and intermittent difficulty with spitting out his tooth paste and droopiness of the left eyelid. His speech is worse with prolonged talking and later in the day.   He is taking cephalexin for right leg cellulitis.  He has not noticed any new symptoms since starting the medication.  No double vision, difficulty with swallowing, or limb weakness.   UPDATE 11/09/2023:  He is here for follow-up visit.  He was having worsening MG symptoms in July and since increasing prednisone  to 20mg  daily, he has been doing great.  No double vision, droopiness of the eyelids, or limb weakness.    Medications:  Current Outpatient Medications on File Prior to Visit  Medication Sig Dispense Refill   acetaminophen  (TYLENOL ) 325 MG tablet Take 2 tablets (650 mg total) by mouth every 4 (four) hours as needed for fever or mild pain.     Calcium  Carb-Cholecalciferol (CALCIUM  600 + D PO) Take 1 tablet by mouth daily.     clopidogrel  (PLAVIX ) 75 MG tablet TAKE 1 TABLET (75 MG TOTAL) BY MOUTH DAILY. 90 tablet 3   cyanocobalamin (VITAMIN B12) 1000 MCG tablet Take 1,000 mcg by mouth daily.     ezetimibe  (ZETIA ) 10 MG tablet Take 10 mg by mouth daily. (Patient taking differently: Take 10 mg by mouth daily.)     metoprolol  succinate (TOPROL -XL) 50 MG 24 hr tablet Take 1 tablet  (50 mg total) by mouth daily. Take with or immediately following a meal.     nitroGLYCERIN  (NITROSTAT ) 0.4 MG SL tablet Place 1 tablet (0.4 mg total) under the tongue every 5 (five) minutes as needed for chest pain. 25 tablet 1   Pitavastatin  Calcium  (LIVALO ) 2 MG TABS Take 2 mg by mouth 2 (two) times a week.     predniSONE  (DELTASONE ) 20 MG tablet Take 20 mg by mouth daily with breakfast.     ramipril  (ALTACE ) 5 MG capsule Take 5 mg by mouth daily.     No current facility-administered medications on file prior to visit.    Allergies:  Allergies  Allergen Reactions   Zetia  [Ezetimibe ] Other (See Comments)    Weakness, shortness of breath   Demerol [Meperidine Hcl]    Statins Other (See Comments)    myalgias    Vital Signs:  BP 128/77   Pulse (!) 57   Ht  5' 6 (1.676 m)   Wt 171 lb (77.6 kg)   SpO2 96%   BMI 27.60 kg/m   Neurological Exam: MENTAL STATUS including orientation to time, place, person, recent and remote memory, attention span and concentration, language, and fund of knowledge is normal.  There is no dysarthria.  CRANIAL NERVES:  Pupils equal round and reactive to light.  Normal conjugate, extra-ocular eye movements in all directions of gaze. Trace left ptosis.  Face is symmetric.  Facial muscles are 5/5 with orbicularis oculi and buccinator testing.    MOTOR:  Motor strength is 5/5 throughout. No fatigability.  COORDINATION/GAIT:   Gait narrow based and stable. Unassisted.  Data: CT head 12/08/2016:  Unremarkable  Labs 04/20/2017:   AChR binding 9.20*, blocking 76*, and modulating 90*  CT chest wo contrast 05/04/2017:   1. Unremarkable CT examination the chest. No anterior mediastinal mass/thymoma is identified. 2. No acute pulmonary findings or worrisome pulmonary lesions. 3. Significant three-vessel coronary artery calcifications. 4. Cholelithiasis.  Labs received 06/29/2017:  TPMT  13 (normal).  Lab Results  Component Value Date   VITAMINB12 259  04/16/2022     Thank you for allowing me to participate in patient's care.  If I can answer any additional questions, I would be pleased to do so.    Sincerely,    Cleaven Demario K. [name], DO

## 2023-11-09 NOTE — Patient Instructions (Addendum)
Reduce prednisone 15 mg daily

## 2023-12-30 ENCOUNTER — Telehealth: Payer: Self-pay | Admitting: Neurology

## 2023-12-30 NOTE — Telephone Encounter (Signed)
 Please let pt know that Agent Orange exposure does not cause myasthenia gravis, but can cause neuropathy.  I had documented this on clinic visit 12/03/2021, we can fax this.

## 2023-12-30 NOTE — Telephone Encounter (Signed)
 Called patient and he stated that he spoke to his [facility] representative Adrian Bell and was informed that the [place] has relaxed their compensation policy for MG. Adrian Bell is asking if Dr., [name] can write a note for him stating that his MG was probably caused by Agent Orange from the [place].Patient stated this note would go a long way for him and help him significantly with a possible compensation.  Patient stated note can be faxed to Adrian Bell at [phone]. Patient would also like a copy mailed to him.

## 2023-12-30 NOTE — Telephone Encounter (Signed)
 Pt lefted a message , and he wants you to return his call. Thanks

## 2024-01-02 NOTE — Telephone Encounter (Signed)
 Called patient and informed him of Dr. Anthony response  Please let pt know that Agent Orange exposure does not cause myasthenia gravis, but can cause neuropathy.  I had documented this on clinic visit 12/03/2021, we can fax this.     Patient verbalized understanding per above and stated he would like me to fax notes to [employer].   Notes have been faxed.

## 2024-01-04 ENCOUNTER — Other Ambulatory Visit: Payer: Self-pay | Admitting: Neurology

## 2024-03-05 ENCOUNTER — Encounter: Payer: Self-pay | Admitting: Neurology

## 2024-03-05 ENCOUNTER — Ambulatory Visit: Admitting: Neurology

## 2024-03-05 VITALS — BP 149/50 | HR 64 | Ht 66.0 in | Wt 177.0 lb

## 2024-03-05 DIAGNOSIS — G629 Polyneuropathy, unspecified: Secondary | ICD-10-CM

## 2024-03-05 DIAGNOSIS — G7000 Myasthenia gravis without (acute) exacerbation: Secondary | ICD-10-CM | POA: Diagnosis not present

## 2024-03-05 MED ORDER — PREDNISONE 10 MG PO TABS: ORAL_TABLET | ORAL | 1 refills | Status: AC

## 2024-03-05 NOTE — Patient Instructions (Signed)
 It was great to see you today!  Reduce prednisone  to 10mg  daily

## 2024-03-05 NOTE — Progress Notes (Signed)
 "    Follow-up Visit   Date: 03/05/2024    Adrian Bell MRN: [id number] DOB: 01/05/1946   Interim History: Adrian Bell is a 79 y.o. right-handed Caucasian male with hypertension, kidney stones, and iron deficiency anemia returning to the clinic for follow-up of seropositive myasthenia gravis and neuropathy.   The patient was accompanied to the clinic by self.  IMPRESSION/PLAN: Seropositive bulbar and generalized myasthenia gravis without exacerbation, stable.  Clinically, doing well with no evidence of disease manifestation.  Diagnosed 2019 s/p thymus resection in 2024.  Steroid-sparing medications have been discussed previously and opted to stay on prednisone .   MG history:  - Highest dose of prednisone  is 30mg /d in 2019 and 2024  - Tapering prednisone  < 5mg  caused breakthrough weakness, so he has been on 5mg /d since early 2022   - January 2023 - new onset of bulbar weakness, prednisone  titrated to 30mg  daily  - April 2023 - IVIG started due to bulbar symptoms   - June 2023 - IVIG adjusted to every 3 weeks, clinically improved  - August 2023 - started prednisone  taper   - November 2023 - IVIG stopped  - Jan - March 2024 - prednisone  10mg  daily  - Feb 2024  - s/p CABG and thymus resection, MG exacerbation  - April 2024 - prednisone  increased to 20mg /d  - May 2024 - prednisone  increased to 25mg /d  - June 2024 - prednisone  increased to 30mg , symptoms improved  - June 2025 - prednisone  tapered down to 7.5mg , but reporting mild dysphagia  - July 2025 - prednisone  increased to 20mg /d   PLAN:   Reduce prednisone  to 10 mg daily Continue calcium  supplement  2. Peripheral neuropathy with distal paresthesias and sensory ataxia, stable Continue supportive care  Return to clinic in 4 months  ------------------------------------------------------------------------------  UPDATE 03/17/2022:  He is here for follow-up visit.  He is doing great with respect to MG and denies any double vision,  droopiness of the eyelids, difficulty swallowing/talking, or limb weakness.  He has been able to taper prednisone  down to 10mg  daily and remains off IVIG.  He has been having shortness of breath with exertion and some chest pressure, which has limited his activity level.  He is no longer doing yard work.   He has not scheduled an appointment with cardiology, but plans to do this.  Neuropathy is unchanged and he has noticed mild increased in unsteadiness/imbalance.  No falls.  He continues to walk independently.   UPDATE 06/16/2022: He was doing well with MG until February.  He underwent 3v CABG in February 2024.   Immediately post-op, he began having difficulty with swallowing, speech, spitting, and arm/leg weakness.  He was having difficulty moving his food in his mouth, initially having to manually use his fingers to get food out of the corners of his cheek.  His prednisone  was increased to 20mg  daily last week and he has noticed mild improvement.  His swallowing and speech is better.   He continues to have mild slurred speech and bilateral leg weakness.  He denies shortness of breath and is able to lay on one pillow.    UPDATE 07/05/2022:  He has been on prednisone  20mg  for about month and reports speech, swallow, and generalized weakness has improved, but he still has some slightly difficulty when tired or after talking for longer periods.  He no longer has difficulty with spitting.  He has noticed increased numbness in the feet and balance.   UPDATE 09/08/2022:  He is  here for follow-up.  In June, he increased prednisone  to 30mg  which improved speech, swallow, and weakness.  He has trace difficulty with spitting and swallowing.    Starting earlier this year, he began noticing having bilateral index finger weakness, which he noticed when trying to dry his ears or in between his toes.  He states the fingers will flex inwards.  No problems with opening jars/bottles.  No neck pain.  UPDATE 11/17/2022:   He  has been compliant with taking prednisone  30mg  daily.    UPDATE 01/19/2023:  He is here for follow-up with his wife.  He has been able to reduce prednisone  to 20mg  daily and doing well.  No problems with ptosis, double vision, limb weakness, or problems with speech/swallow. Neuropathy is stable with numbness and remain from the mid-calf down in the feet. Balance is good. No falls.   UPDATE 05/04/2023:  He is here for follow-up visit.  He tapered prednisone  to 10mg  daily on 12/28 and had been doing well.  He denies problems with speech/swallow, double vision, or ptosis.  No new complaints.   UPDATE 08/10/2023:  About two weeks ago, he began having slurred speech and intermittent difficulty with spitting out his tooth paste and droopiness of the left eyelid. His speech is worse with prolonged talking and later in the day.   He is taking cephalexin for right leg cellulitis.  He has not noticed any new symptoms since starting the medication.  No double vision, difficulty with swallowing, or limb weakness.   Prednisone  was increased to 20mg  daily.   UPDATE 03/05/2024:  He is here for follow-up visit.  He is on prednisone  15mg  daily and is doing well.  He was able to reduce prednisone  from 20mg  and denies having any breakthrough double vision, droopiness of the eyelids, or limb weakness.    Medications:  Current Outpatient Medications on File Prior to Visit  Medication Sig Dispense Refill   acetaminophen  (TYLENOL ) 325 MG tablet Take 2 tablets (650 mg total) by mouth every 4 (four) hours as needed for fever or mild pain.     Calcium  Carb-Cholecalciferol (CALCIUM  600 + D PO) Take 1 tablet by mouth daily.     clopidogrel  (PLAVIX ) 75 MG tablet TAKE 1 TABLET (75 MG TOTAL) BY MOUTH DAILY. 90 tablet 3   cyanocobalamin (VITAMIN B12) 1000 MCG tablet Take 1,000 mcg by mouth daily.     metoprolol  succinate (TOPROL -XL) 50 MG 24 hr tablet Take 1 tablet (50 mg total) by mouth daily. Take with or immediately following a  meal.     nitroGLYCERIN  (NITROSTAT ) 0.4 MG SL tablet Place 1 tablet (0.4 mg total) under the tongue every 5 (five) minutes as needed for chest pain. 25 tablet 1   Pitavastatin  Calcium  (LIVALO ) 2 MG TABS Take 2 mg by mouth 2 (two) times a week.     predniSONE  (DELTASONE ) 10 MG tablet Take 15 mg daily (1.5 table) (Patient taking differently: Take 15 mg by mouth daily. Take 15 mg daily (1.5 table)) 135 tablet 1   ramipril  (ALTACE ) 5 MG capsule Take 5 mg by mouth daily.     ezetimibe  (ZETIA ) 10 MG tablet Take 10 mg by mouth daily. (Patient not taking: Reported on 03/05/2024)     No current facility-administered medications on file prior to visit.    Allergies:  Allergies  Allergen Reactions   Zetia  [Ezetimibe ] Other (See Comments)    Weakness, shortness of breath   Demerol [Meperidine Hcl]    Statins Other (  See Comments)    myalgias    Vital Signs:  BP (!) 149/50   Pulse 64   Ht 5' 6 (1.676 m)   Wt 177 lb (80.3 kg)   SpO2 97%   BMI 28.57 kg/m   Neurological Exam: MENTAL STATUS including orientation to time, place, person, recent and remote memory, attention span and concentration, language, and fund of knowledge is normal.  There is no dysarthria.  CRANIAL NERVES:  Pupils equal round and reactive to light.  Normal conjugate, extra-ocular eye movements in all directions of gaze. Trace left ptosis.  Face is symmetric.  Facial muscles are 5/5 with orbicularis oculi and buccinator testing.    MOTOR:  Motor strength is 5/5 throughout. No fatigability.  SENSORY:  Vibration reduced at the ankles bilaterally, worse on the left.   COORDINATION/GAIT:   Gait narrow based and stable. Unassisted.  Data: CT head 12/08/2016:  Unremarkable  Labs 04/20/2017:   AChR binding 9.20*, blocking 76*, and modulating 90*  CT chest wo contrast 05/04/2017:   1. Unremarkable CT examination the chest. No anterior mediastinal mass/thymoma is identified. 2. No acute pulmonary findings or worrisome pulmonary  lesions. 3. Significant three-vessel coronary artery calcifications. 4. Cholelithiasis.  Labs received 06/29/2017:  TPMT  13 (normal).  Lab Results  Component Value Date   VITAMINB12 259 04/16/2022     Thank you for allowing me to participate in patient's care.  If I can answer any additional questions, I would be pleased to do so.    Sincerely,    Jamy Cleckler K. [name], DO "

## 2024-03-06 ENCOUNTER — Ambulatory Visit: Admitting: Neurology

## 2024-05-21 ENCOUNTER — Ambulatory Visit: Admitting: Cardiology

## 2024-07-04 ENCOUNTER — Ambulatory Visit: Payer: Self-pay | Admitting: Neurology
